# Patient Record
Sex: Female | Born: 1945
Health system: Southern US, Community
[De-identification: ages and names within clinical notes are randomized; demographics above are authoritative.]

## PROBLEM LIST (undated history)

## (undated) DIAGNOSIS — R001 Bradycardia, unspecified: Secondary | ICD-10-CM

## (undated) DIAGNOSIS — R51 Headache: Secondary | ICD-10-CM

## (undated) DIAGNOSIS — M1990 Unspecified osteoarthritis, unspecified site: Secondary | ICD-10-CM

## (undated) DIAGNOSIS — R112 Nausea with vomiting, unspecified: Secondary | ICD-10-CM

## (undated) DIAGNOSIS — Z9889 Other specified postprocedural states: Secondary | ICD-10-CM

## (undated) DIAGNOSIS — D649 Anemia, unspecified: Secondary | ICD-10-CM

## (undated) DIAGNOSIS — Z9289 Personal history of other medical treatment: Secondary | ICD-10-CM

## (undated) DIAGNOSIS — N390 Urinary tract infection, site not specified: Secondary | ICD-10-CM

## (undated) DIAGNOSIS — F32A Depression, unspecified: Secondary | ICD-10-CM

## (undated) DIAGNOSIS — I341 Nonrheumatic mitral (valve) prolapse: Secondary | ICD-10-CM

## (undated) DIAGNOSIS — Z950 Presence of cardiac pacemaker: Secondary | ICD-10-CM

## (undated) DIAGNOSIS — M797 Fibromyalgia: Secondary | ICD-10-CM

## (undated) DIAGNOSIS — K219 Gastro-esophageal reflux disease without esophagitis: Secondary | ICD-10-CM

## (undated) DIAGNOSIS — G8929 Other chronic pain: Secondary | ICD-10-CM

## (undated) DIAGNOSIS — F419 Anxiety disorder, unspecified: Secondary | ICD-10-CM

## (undated) DIAGNOSIS — J189 Pneumonia, unspecified organism: Secondary | ICD-10-CM

## (undated) DIAGNOSIS — R42 Dizziness and giddiness: Secondary | ICD-10-CM

## (undated) DIAGNOSIS — F329 Major depressive disorder, single episode, unspecified: Secondary | ICD-10-CM

## (undated) DIAGNOSIS — E785 Hyperlipidemia, unspecified: Secondary | ICD-10-CM

## (undated) DIAGNOSIS — I4891 Unspecified atrial fibrillation: Secondary | ICD-10-CM

## (undated) DIAGNOSIS — R519 Headache, unspecified: Secondary | ICD-10-CM

## (undated) DIAGNOSIS — K589 Irritable bowel syndrome without diarrhea: Secondary | ICD-10-CM

## (undated) HISTORY — DX: Gastro-esophageal reflux disease without esophagitis: K21.9

## (undated) HISTORY — DX: Pneumonia, unspecified organism: J18.9

## (undated) HISTORY — DX: Headache: R51

## (undated) HISTORY — PX: BLADDER SUSPENSION: SHX72

## (undated) HISTORY — DX: Anemia, unspecified: D64.9

## (undated) HISTORY — DX: Dizziness and giddiness: R42

## (undated) HISTORY — DX: Anxiety disorder, unspecified: F41.9

## (undated) HISTORY — PX: TOTAL ABDOMINAL HYSTERECTOMY: SHX209

## (undated) HISTORY — PX: TYMPANOPLASTY: SHX33

## (undated) HISTORY — PX: DILATION AND CURETTAGE OF UTERUS: SHX78

## (undated) HISTORY — DX: Major depressive disorder, single episode, unspecified: F32.9

## (undated) HISTORY — DX: Fibromyalgia: M79.7

## (undated) HISTORY — DX: Depression, unspecified: F32.A

## (undated) HISTORY — DX: Headache, unspecified: R51.9

## (undated) HISTORY — DX: Unspecified osteoarthritis, unspecified site: M19.90

## (undated) HISTORY — DX: Nonrheumatic mitral (valve) prolapse: I34.1

## (undated) HISTORY — DX: Hyperlipidemia, unspecified: E78.5

## (undated) HISTORY — PX: TONSILLECTOMY AND ADENOIDECTOMY: SUR1326

## (undated) HISTORY — DX: Other chronic pain: G89.29

## (undated) HISTORY — DX: Urinary tract infection, site not specified: N39.0

## (undated) HISTORY — DX: Irritable bowel syndrome, unspecified: K58.9

## (undated) HISTORY — DX: Personal history of other medical treatment: Z92.89

---

## 1999-07-08 ENCOUNTER — Other Ambulatory Visit: Admission: RE | Admit: 1999-07-08 | Discharge: 1999-07-08 | Payer: Self-pay | Admitting: Gynecology

## 2000-01-17 ENCOUNTER — Encounter: Payer: Self-pay | Admitting: Internal Medicine

## 2000-01-17 ENCOUNTER — Encounter: Admission: RE | Admit: 2000-01-17 | Discharge: 2000-01-17 | Payer: Self-pay | Admitting: Internal Medicine

## 2000-03-03 ENCOUNTER — Encounter: Admission: RE | Admit: 2000-03-03 | Discharge: 2000-03-03 | Payer: Self-pay | Admitting: Internal Medicine

## 2000-03-03 ENCOUNTER — Ambulatory Visit (HOSPITAL_COMMUNITY): Admission: RE | Admit: 2000-03-03 | Discharge: 2000-03-03 | Payer: Self-pay | Admitting: Internal Medicine

## 2000-03-03 ENCOUNTER — Encounter: Payer: Self-pay | Admitting: Internal Medicine

## 2000-06-24 ENCOUNTER — Encounter: Admission: RE | Admit: 2000-06-24 | Discharge: 2000-06-24 | Payer: Self-pay | Admitting: Internal Medicine

## 2000-06-24 ENCOUNTER — Encounter: Payer: Self-pay | Admitting: Internal Medicine

## 2000-10-08 ENCOUNTER — Encounter: Payer: Self-pay | Admitting: Neurology

## 2000-10-08 ENCOUNTER — Ambulatory Visit (HOSPITAL_COMMUNITY): Admission: RE | Admit: 2000-10-08 | Discharge: 2000-10-08 | Payer: Self-pay | Admitting: Neurology

## 2000-12-10 ENCOUNTER — Other Ambulatory Visit: Admission: RE | Admit: 2000-12-10 | Discharge: 2000-12-10 | Payer: Self-pay | Admitting: Gynecology

## 2002-02-07 ENCOUNTER — Encounter: Admission: RE | Admit: 2002-02-07 | Discharge: 2002-02-07 | Payer: Self-pay | Admitting: Internal Medicine

## 2002-02-07 ENCOUNTER — Encounter: Payer: Self-pay | Admitting: Internal Medicine

## 2003-01-02 ENCOUNTER — Encounter: Admission: RE | Admit: 2003-01-02 | Discharge: 2003-01-02 | Payer: Self-pay | Admitting: Internal Medicine

## 2003-01-02 ENCOUNTER — Encounter: Payer: Self-pay | Admitting: Internal Medicine

## 2003-02-23 ENCOUNTER — Encounter: Admission: RE | Admit: 2003-02-23 | Discharge: 2003-02-23 | Payer: Self-pay | Admitting: Internal Medicine

## 2003-02-23 ENCOUNTER — Encounter: Payer: Self-pay | Admitting: Internal Medicine

## 2004-02-27 ENCOUNTER — Ambulatory Visit (HOSPITAL_COMMUNITY): Admission: RE | Admit: 2004-02-27 | Discharge: 2004-02-27 | Payer: Self-pay | Admitting: Internal Medicine

## 2004-08-29 ENCOUNTER — Ambulatory Visit: Payer: Self-pay | Admitting: Internal Medicine

## 2004-09-01 ENCOUNTER — Encounter: Admission: RE | Admit: 2004-09-01 | Discharge: 2004-09-01 | Payer: Self-pay | Admitting: Internal Medicine

## 2007-04-08 ENCOUNTER — Other Ambulatory Visit: Admission: RE | Admit: 2007-04-08 | Discharge: 2007-04-08 | Payer: Self-pay | Admitting: Gynecology

## 2008-05-09 ENCOUNTER — Ambulatory Visit: Payer: Self-pay | Admitting: Pulmonary Disease

## 2008-10-11 ENCOUNTER — Encounter: Admission: RE | Admit: 2008-10-11 | Discharge: 2008-10-11 | Payer: Self-pay

## 2008-10-15 ENCOUNTER — Ambulatory Visit: Payer: Self-pay | Admitting: Internal Medicine

## 2008-10-26 ENCOUNTER — Ambulatory Visit: Payer: Self-pay | Admitting: Internal Medicine

## 2008-11-16 ENCOUNTER — Encounter: Admission: RE | Admit: 2008-11-16 | Discharge: 2008-11-16 | Payer: Self-pay | Admitting: Internal Medicine

## 2008-11-16 ENCOUNTER — Ambulatory Visit: Payer: Self-pay | Admitting: Internal Medicine

## 2008-11-26 ENCOUNTER — Telehealth (INDEPENDENT_AMBULATORY_CARE_PROVIDER_SITE_OTHER): Payer: Self-pay

## 2008-11-27 ENCOUNTER — Ambulatory Visit: Payer: Self-pay

## 2008-11-27 ENCOUNTER — Encounter: Payer: Self-pay | Admitting: Cardiology

## 2009-01-17 ENCOUNTER — Ambulatory Visit: Payer: Self-pay | Admitting: Internal Medicine

## 2009-03-27 ENCOUNTER — Ambulatory Visit: Payer: Self-pay | Admitting: Internal Medicine

## 2009-06-24 ENCOUNTER — Ambulatory Visit: Payer: Self-pay | Admitting: Internal Medicine

## 2009-07-08 ENCOUNTER — Ambulatory Visit: Payer: Self-pay | Admitting: Internal Medicine

## 2010-02-24 ENCOUNTER — Ambulatory Visit: Payer: Self-pay | Admitting: Internal Medicine

## 2010-03-07 ENCOUNTER — Ambulatory Visit: Payer: Self-pay | Admitting: Internal Medicine

## 2010-03-11 ENCOUNTER — Ambulatory Visit: Payer: Self-pay | Admitting: Gynecology

## 2010-03-11 ENCOUNTER — Other Ambulatory Visit: Admission: RE | Admit: 2010-03-11 | Discharge: 2010-03-11 | Payer: Self-pay | Admitting: Gynecology

## 2010-03-21 ENCOUNTER — Ambulatory Visit: Payer: Self-pay | Admitting: Internal Medicine

## 2010-04-03 ENCOUNTER — Ambulatory Visit: Payer: Self-pay | Admitting: Internal Medicine

## 2010-04-18 ENCOUNTER — Ambulatory Visit: Payer: Self-pay | Admitting: Internal Medicine

## 2010-04-24 ENCOUNTER — Ambulatory Visit: Payer: Self-pay | Admitting: Gynecology

## 2010-04-28 ENCOUNTER — Ambulatory Visit: Payer: Self-pay | Admitting: Internal Medicine

## 2010-04-30 ENCOUNTER — Encounter: Admission: RE | Admit: 2010-04-30 | Discharge: 2010-04-30 | Payer: Self-pay | Admitting: Internal Medicine

## 2010-07-18 ENCOUNTER — Ambulatory Visit: Payer: Self-pay | Admitting: Internal Medicine

## 2010-08-22 ENCOUNTER — Ambulatory Visit
Admission: RE | Admit: 2010-08-22 | Discharge: 2010-08-22 | Payer: Self-pay | Source: Home / Self Care | Attending: Internal Medicine | Admitting: Internal Medicine

## 2010-09-23 ENCOUNTER — Ambulatory Visit (INDEPENDENT_AMBULATORY_CARE_PROVIDER_SITE_OTHER): Payer: Medicare Other | Admitting: Internal Medicine

## 2010-09-23 DIAGNOSIS — F329 Major depressive disorder, single episode, unspecified: Secondary | ICD-10-CM

## 2010-09-23 DIAGNOSIS — M25519 Pain in unspecified shoulder: Secondary | ICD-10-CM

## 2010-11-24 ENCOUNTER — Ambulatory Visit (INDEPENDENT_AMBULATORY_CARE_PROVIDER_SITE_OTHER): Payer: Medicare Other | Admitting: Internal Medicine

## 2010-11-24 DIAGNOSIS — IMO0001 Reserved for inherently not codable concepts without codable children: Secondary | ICD-10-CM

## 2010-11-24 DIAGNOSIS — R5381 Other malaise: Secondary | ICD-10-CM

## 2010-11-24 DIAGNOSIS — F329 Major depressive disorder, single episode, unspecified: Secondary | ICD-10-CM

## 2011-02-02 ENCOUNTER — Other Ambulatory Visit: Payer: Self-pay | Admitting: *Deleted

## 2011-02-02 MED ORDER — ALPRAZOLAM 0.5 MG PO TABS: 0.5000 mg | ORAL_TABLET | Freq: Every evening | ORAL | Status: DC | PRN

## 2011-02-13 ENCOUNTER — Encounter: Payer: Self-pay | Admitting: *Deleted

## 2011-02-17 ENCOUNTER — Other Ambulatory Visit: Payer: Self-pay | Admitting: *Deleted

## 2011-02-17 MED ORDER — FLUOXETINE HCL 40 MG PO CAPS: 40.0000 mg | ORAL_CAPSULE | Freq: Every day | ORAL | Status: DC

## 2011-03-10 ENCOUNTER — Telehealth: Payer: Self-pay

## 2011-03-10 MED ORDER — HYDROCODONE-ACETAMINOPHEN 10-650 MG PO TABS: 1.0000 | ORAL_TABLET | Freq: Four times a day (QID) | ORAL | Status: AC | PRN

## 2011-03-10 NOTE — Telephone Encounter (Signed)
Faxed to drug store Lorcet 10/650 (#60) no refill one p.o. q 6 hours prn pain

## 2011-03-10 NOTE — Telephone Encounter (Signed)
Patient request for Lorcet 10/650 written per Dr. Lenord Fellers and faxed to her pharmacy

## 2011-03-27 ENCOUNTER — Other Ambulatory Visit: Payer: Self-pay

## 2011-03-27 MED ORDER — BUPROPION HCL ER (XL) 150 MG PO TB24: 150.0000 mg | ORAL_TABLET | ORAL | Status: DC

## 2011-05-07 ENCOUNTER — Encounter: Payer: Self-pay | Admitting: Internal Medicine

## 2011-05-07 ENCOUNTER — Telehealth: Payer: Self-pay | Admitting: Internal Medicine

## 2011-05-07 ENCOUNTER — Telehealth: Payer: Self-pay

## 2011-05-07 MED ORDER — ALPRAZOLAM 1 MG PO TABS: 1.0000 mg | ORAL_TABLET | Freq: Every evening | ORAL | Status: DC | PRN

## 2011-05-07 NOTE — Telephone Encounter (Signed)
New RX for Xanax phoned to [facility].  Patient advised.

## 2011-05-07 NOTE — Telephone Encounter (Signed)
New dose of Alprazolam faxed to [facility]

## 2011-06-12 ENCOUNTER — Encounter: Payer: Self-pay | Admitting: Internal Medicine

## 2011-06-15 ENCOUNTER — Other Ambulatory Visit: Payer: Medicare Other | Admitting: Internal Medicine

## 2011-06-15 DIAGNOSIS — J309 Allergic rhinitis, unspecified: Secondary | ICD-10-CM

## 2011-06-15 DIAGNOSIS — R519 Headache, unspecified: Secondary | ICD-10-CM

## 2011-06-15 DIAGNOSIS — M797 Fibromyalgia: Secondary | ICD-10-CM

## 2011-06-15 LAB — CBC WITH DIFFERENTIAL/PLATELET
Basophils Absolute: 0 10*3/uL (ref 0.0–0.1)
Basophils Relative: 0 % (ref 0–1)
Eosinophils Absolute: 0.1 10*3/uL (ref 0.0–0.7)
Eosinophils Relative: 3 % (ref 0–5)
HCT: 40.4 % (ref 36.0–46.0)
MCH: 27.4 pg (ref 26.0–34.0)
MCHC: 31.9 g/dL (ref 30.0–36.0)
MCV: 85.8 fL (ref 78.0–100.0)
Monocytes Absolute: 0.6 10*3/uL (ref 0.1–1.0)
RDW: 13 % (ref 11.5–15.5)

## 2011-06-15 LAB — COMPREHENSIVE METABOLIC PANEL
AST: 19 U/L (ref 0–37)
Alkaline Phosphatase: 58 U/L (ref 39–117)
BUN: 20 mg/dL (ref 6–23)
Calcium: 8.9 mg/dL (ref 8.4–10.5)
Chloride: 103 mEq/L (ref 96–112)
Creat: 0.59 mg/dL (ref 0.50–1.10)

## 2011-06-15 LAB — LIPID PANEL
HDL: 56 mg/dL (ref >39)
LDL Cholesterol: 184 mg/dL — ABNORMAL HIGH (ref 0–99)
Triglycerides: 102 mg/dL (ref <150)

## 2011-06-16 ENCOUNTER — Encounter: Payer: Self-pay | Admitting: Internal Medicine

## 2011-06-16 ENCOUNTER — Ambulatory Visit
Admission: RE | Admit: 2011-06-16 | Discharge: 2011-06-16 | Disposition: A | Discharge status: Home / Self Care | Payer: Medicare Other | Source: Ambulatory Visit | Attending: Internal Medicine | Admitting: Internal Medicine

## 2011-06-16 ENCOUNTER — Ambulatory Visit (INDEPENDENT_AMBULATORY_CARE_PROVIDER_SITE_OTHER): Payer: Medicare Other | Admitting: Internal Medicine

## 2011-06-16 DIAGNOSIS — F32A Depression, unspecified: Secondary | ICD-10-CM | POA: Insufficient documentation

## 2011-06-16 DIAGNOSIS — IMO0001 Reserved for inherently not codable concepts without codable children: Secondary | ICD-10-CM

## 2011-06-16 DIAGNOSIS — M797 Fibromyalgia: Secondary | ICD-10-CM

## 2011-06-16 DIAGNOSIS — K219 Gastro-esophageal reflux disease without esophagitis: Secondary | ICD-10-CM | POA: Insufficient documentation

## 2011-06-16 DIAGNOSIS — E785 Hyperlipidemia, unspecified: Secondary | ICD-10-CM

## 2011-06-16 DIAGNOSIS — F419 Anxiety disorder, unspecified: Secondary | ICD-10-CM | POA: Insufficient documentation

## 2011-06-16 DIAGNOSIS — J302 Other seasonal allergic rhinitis: Secondary | ICD-10-CM | POA: Insufficient documentation

## 2011-06-16 DIAGNOSIS — Z8679 Personal history of other diseases of the circulatory system: Secondary | ICD-10-CM | POA: Insufficient documentation

## 2011-06-16 DIAGNOSIS — J309 Allergic rhinitis, unspecified: Secondary | ICD-10-CM

## 2011-06-16 DIAGNOSIS — F329 Major depressive disorder, single episode, unspecified: Secondary | ICD-10-CM | POA: Insufficient documentation

## 2011-06-16 DIAGNOSIS — Z8669 Personal history of other diseases of the nervous system and sense organs: Secondary | ICD-10-CM | POA: Insufficient documentation

## 2011-06-16 DIAGNOSIS — Z0000 Encounter for general adult medical examination without abnormal findings: Secondary | ICD-10-CM

## 2011-06-16 DIAGNOSIS — R05 Cough: Secondary | ICD-10-CM

## 2011-06-16 DIAGNOSIS — F411 Generalized anxiety disorder: Secondary | ICD-10-CM

## 2011-06-16 LAB — POCT URINALYSIS DIPSTICK
Blood, UA: NEGATIVE
Glucose, UA: NEGATIVE
Spec Grav, UA: 1.02
Urobilinogen, UA: 0.2

## 2011-06-16 NOTE — Progress Notes (Signed)
Subjective:    Patient ID: Wendy Rose, female    DOB: September 18, 1945, 65 y.o.   MRN: [id number]  HPI 65 year old white female with complicated history. History of fibromyalgia syndrome for many years currently seen by Dr. Corliss Skains. History of anxiety and depression. History of asthma. History of GE reflux and migraine headache. Rheumatologist sent patient to see psychiatrist, I believe it might be Dr. Evelene Croon but patient has not been there in a while. Her mother died in 04/09/2023 and she has a significant grief reaction. She has tried Cymbalta in the past but says that it had adverse reactions in that it made her "feet everybody". Has tried Lyrica in the past. She and her husband are in today and I had a lengthy talk with both of them. He is concerned about her. Says she has no energy , has a chronic cough, is lethargic, has fallen a couple of times. She is taking a lot of meds at night. This includes all [place], Robaxin, Xanax. She was taking Protonix for GE reflux. Says is not working and she's had more heartburn recently which is concerning to her. She's also on Prozac and Wellbutrin as well as metoprolol for history of palpitations and mitral valve prolapse. Takes Lorcet when necessary whenever fibromyalgia algia pain is severe. Patient blames herself for her Mother's Day S. Mother was in a nursing home and apparently had a stroke. Patient feels that she should of been more aggressive in getting her to the hospital sooner. Patient does not smoke or consume alcohol. Has one son in good health.  Family history: mother died of complications of a stroke, father died at age 60 of an MI. One brother died of an MI with history of pacemaker and congenital heart defect. One sister with history of mitral valve prolapse, hypertension congestive heart failure and pacemaker.  Husband recently had a scare with his heart is well. He has COPD. Says that she's been worried about him in addition to grieving over her mother.  Husband is retired from [employer]  Patient was diagnosed with mitral valve prolapse in the  1990s by Dr. Elsie Lincoln. 2-D echocardiogram 06/03/1989 showed midsystolic posterior leaflet prolapse and subtle evidence of anterior mitral leaflet prolapse.  For while she took amitriptyline for fibromyalgia. For many years took Darvocet for fibromyalgia pain. Formerly seen by Dr. Phylliss Bob, rheumatologist. Has had basic rheumatology workup consisting of a negative ANA, normal CPK, normal sed rate.  History of herpes zoster ophthalmicus September 2011 seen by Dr. Elmer Picker. Has seen Dr. clinic in for allergic rhinitis and eustachian tube dysfunction with chronic cough. Normal pulmonary functions done in 2000. Allergy testing done positive for grass, tree, and mold. Had allergy vaccine which ended in 1993.  [facility] last 4 evaluation of musculoskeletal weakness. Did have an EMG that showed mild myopathic changes. Muscle biopsy in the past has been nonspecific. It was felt that the myopathic changes were mild and perhaps related to a radiculopathy. However this recent history of falling is more concerning I think she needs to go back to do. Neurologist she saw there was Dr. Georgina Pillion she's also going to have a chest x-ray to evaluate cough which could be related to GE reflux and/or allergy/asthma. Has been seen in the past by Dr. Meryl Crutch & diagnosed with migraine headaches. Issues with fibromyalgia migraine headaches and asthma dates back to the 1990s. Was seen by psychiatrist at that time, Dr. Claudette Head who has since left town  Also history of recurrent urinary tract infections. Has been diagnosed with hypotonic bladder and urethra rhinitis by urologist. Was maintained on trimethoprim for while.  Intolerant to Macrobid sulfa Ceftin and codeine. Says Macrobid caused elevated liver functions and Ceftin caused a rash. Is able to take hydrocodone. Naprosyn causes GI upset. Unable to tolerate Clinoril or  sulfa drugs.    Review of Systems  Constitutional: Positive for fatigue.  Eyes: Negative.   Cardiovascular: Negative.   Gastrointestinal:       Heartburn not controlled with Protonix  Genitourinary:       History of recurrent urinary tract infections, urethra rhinitis and hypotonic bladder  Musculoskeletal: Positive for myalgias.  Skin: Negative.   Neurological: Positive for weakness and headaches.  Hematological: Negative.   Psychiatric/Behavioral: Positive for dysphoric mood and decreased concentration. The patient is nervous/anxious.        Objective:   Physical Exam  Vitals reviewed. Constitutional: She is oriented to person, place, and time. She appears well-developed. She appears distressed.       Patient seems extremely worried about her health. Says that her he had does not feel "right ".  HENT:  Head: Normocephalic and atraumatic.  Right Ear: External ear normal.  Left Ear: External ear normal.  Mouth/Throat: Oropharynx is clear and moist.  Eyes: EOM are normal. Pupils are equal, round, and reactive to light. No scleral icterus.  Neck: Neck supple. No JVD present. No thyromegaly present.  Cardiovascular: Normal rate, regular rhythm, normal heart sounds and intact distal pulses.   No murmur heard.      No click appreciated  Pulmonary/Chest: Effort normal and breath sounds normal. She has no wheezes. She has no rales.       Breasts normal female  Abdominal: Soft. Bowel sounds are normal. She exhibits no distension and no mass. There is no tenderness. There is no rebound and no guarding.  Genitourinary:       Deferred  Musculoskeletal: Normal range of motion. She exhibits tenderness. She exhibits no edema.       Multiple trigger points in back  Lymphadenopathy:    She has no cervical adenopathy.  Neurological: She is alert and oriented to person, place, and time. She has normal reflexes. No cranial nerve deficit. She exhibits normal muscle tone. Coordination normal.    Skin: Skin is warm and dry.  Psychiatric: Judgment normal.       Dysphoric mood          Assessment & Plan:  Depression  Grief reaction Fibromyalgia syndrome  Nonspecific mild myelopathy diagnosed on EMG at the  History of migraine headache  Remote history of mitral valve prolapse  History of allergic rhinitis  Plan: Patient needs to have mammogram. Suggest patient return to see Dr. Evelene Croon regarding depression and grief reaction. Suggest patient return to do neurology department for further evaluation particularly with complaints of recent falls. Have asked patient to discuss with rheumatologist her medication regimen and consider cutting back on it a bit. May be feeling drowsy in the mornings for medication she takes at bedtime. Refer to GI physician for evaluation of heartburn. She and her husband seem to be satisfied with this plan. She will also have a chest x-ray because of complaint of cough.

## 2011-06-16 NOTE — Patient Instructions (Signed)
Please have a mammogram. You're being sent for chest x-ray to evaluate cough. Your cholesterol is elevated. He has not tolerated statin medications in the past so we're just simply going to go with diet and exercise. Please return to see psychiatrist for reevaluation. Please return to do to neurology department for reevaluation. We will refer you to gastroenterologist for evaluation of heartburn.

## 2011-06-17 NOTE — Progress Notes (Signed)
Addended by: Judy Pimple on: 06/17/2011 11:38 AM   Modules accepted: Orders

## 2011-06-23 ENCOUNTER — Encounter: Payer: Self-pay | Admitting: Gastroenterology

## 2011-06-25 ENCOUNTER — Other Ambulatory Visit: Payer: Self-pay

## 2011-06-25 MED ORDER — PANTOPRAZOLE SODIUM 40 MG PO TBEC: 40.0000 mg | DELAYED_RELEASE_TABLET | Freq: Every day | ORAL | Status: DC

## 2011-07-14 ENCOUNTER — Ambulatory Visit: Payer: Medicare Other | Admitting: Gastroenterology

## 2011-07-20 ENCOUNTER — Encounter: Payer: Self-pay | Admitting: Internal Medicine

## 2011-07-20 ENCOUNTER — Ambulatory Visit (INDEPENDENT_AMBULATORY_CARE_PROVIDER_SITE_OTHER): Payer: Medicare Other | Admitting: Internal Medicine

## 2011-07-20 VITALS — BP 110/60 | HR 74 | Ht 71.0 in | Wt 159.0 lb

## 2011-07-20 DIAGNOSIS — K219 Gastro-esophageal reflux disease without esophagitis: Secondary | ICD-10-CM

## 2011-07-20 DIAGNOSIS — Z1211 Encounter for screening for malignant neoplasm of colon: Secondary | ICD-10-CM

## 2011-07-20 DIAGNOSIS — K589 Irritable bowel syndrome without diarrhea: Secondary | ICD-10-CM

## 2011-07-20 NOTE — Progress Notes (Signed)
HISTORY OF PRESENT ILLNESS:  Wendy Rose is a 65 y.o. female with multiple medical problems as listed below. She carries the diagnosis of irritable bowel syndrome and GERD, though she has not had a formal GI evaluation. She does report long-standing chronic GERD symptoms that require PPI therapy for relief. She generally takes pantoprazole 40 mg daily. Despite this occasional indigestion and pyrosis with dietary indiscretion. She will take occasional antacids for breakthrough symptoms. She is anticipating a course of prednisone, which he states exacerbates her GERD. She said today regarding for more formal valuation of chronic GERD symptoms requiring PPI for incomplete relief. She has been advised previously with regard to screening colonoscopy, but has declined. She states that her durable bowel syndrome as manifested by alternating constipation and diarrhea as well as bloating. Triggers seem to be meals and anxiety or stress. She denies dysphagia. Her husband Wendy Maduro, is a patient of mine. Review of outside laboratories from 06/15/2011 revealed normal CBC and comprehensive metabolic panel.  REVIEW OF SYSTEMS:  All non-GI ROS negative except for sinus and allergy trouble, anxiety, arthritis, back pain, visual change due to cataracts, confusion, cough, depression, fatigue, headaches, heart murmur, heart rhythm change, itching, muscle pains, muscle cramps, sleeping problems, excessive thirst.  Past Medical History  Diagnosis Date  . Fibromyalgia   . MVP (mitral valve prolapse)   . Vertigo   . Anemia   . Anxiety   . heart arrhythmia   . Arthritis   . Chronic headaches   . Depression   . GERD (gastroesophageal reflux disease)   . Hyperlipemia   . IBS (irritable bowel syndrome)   . Pneumonia   . UTI (lower urinary tract infection)     Past Surgical History  Procedure Date  . Tonsillectomy and adenoidectomy   . Tympanoplasty   . Dilation and curettage of uterus   . Total abdominal  hysterectomy   . Bladder suspension   . Cesarean section     Social History Wendy Rose  reports that she has never smoked. She has never used smokeless tobacco. She reports that she does not drink alcohol or use illicit drugs.  family history includes Diabetes in her sister; Heart disease in her brother and father; and Hypertension in her mother.  Allergies  Allergen Reactions  . Cefuroxime Axetil     REACTION: Rash  . Codeine     REACTION: Reaction not known  . Naproxen     REACTION: Reaction not known  . Nitrofurantoin     REACTION: Increase LFT's  . Sulfonamide Derivatives     REACTION: Reaction not known  . Sulindac     REACTION: Reaction not known       PHYSICAL EXAMINATION: Vital signs: BP 110/60  Pulse 74  Ht 5\' 11"  (1.803 m)  Wt 159 lb (72.122 kg)  BMI 22.18 kg/m2  Constitutional: generally well-appearing, no acute distress Psychiatric: alert and oriented x3, cooperative Eyes: extraocular movements intact, anicteric, conjunctiva pink Mouth: oral pharynx moist, no lesions Neck: supple no lymphadenopathy Cardiovascular: heart regular rate and rhythm, no murmur Lungs: clear to auscultation bilaterally Abdomen: soft, nontender, nondistended, no obvious ascites, no peritoneal signs, normal bowel sounds, no organomegaly Rectal: Ommitted Extremities: no lower extremity edema bilaterally Skin: no lesions on visible extremities Neuro: No focal deficits.   ASSESSMENT:  #1. Chronic GERD. Breakthrough symptoms despite daily PPI #2. Irritable bowel syndrome #3. Colon cancer screening. Appropriate candidate without contraindication. Patient declines. I did discuss with her the nature of the  procedure as well as the risks, benefits, and alternatives. I also provide educational literature on colon polyps and colon cancer as well as colonoscopy. If she changes her mind, she's invited to contact the office to schedule the examination. She clearly understands the  importance.   PLAN:   #1. Reflux percussions #2. Continue PPI #3. Diagnostic upper endoscopy. The nature of the procedure, as well as the risks, benefits, and alternatives were carefully and thoroughly reviewed with the patient. Ample time for discussion and questions allowed. The patient understood, was satisfied, and agreed to proceed.

## 2011-07-20 NOTE — Patient Instructions (Signed)
You have been scheduled for an endoscopy. Please follow written instructions given to you at your visit today.  

## 2011-07-22 ENCOUNTER — Telehealth: Payer: Self-pay | Admitting: Internal Medicine

## 2011-07-22 NOTE — Telephone Encounter (Signed)
Appointment was scheduled with Dr. Kelli Hope at [facility] Neurology Department for August 26, 2011 at 3:00 p.m. Records have been faxed to [phone] and confirmation of fax receipt has been received.

## 2011-07-29 ENCOUNTER — Ambulatory Visit (AMBULATORY_SURGERY_CENTER): Payer: Medicare Other | Admitting: Internal Medicine

## 2011-07-29 ENCOUNTER — Encounter: Payer: Self-pay | Admitting: Internal Medicine

## 2011-07-29 VITALS — BP 142/80 | HR 56 | Temp 96.7°F | Resp 15 | Ht 71.0 in | Wt 159.0 lb

## 2011-07-29 DIAGNOSIS — K219 Gastro-esophageal reflux disease without esophagitis: Secondary | ICD-10-CM

## 2011-07-29 DIAGNOSIS — D131 Benign neoplasm of stomach: Secondary | ICD-10-CM

## 2011-07-29 MED ORDER — SODIUM CHLORIDE 0.9 % IV SOLN: 500.0000 mL | INTRAVENOUS | Status: DC

## 2011-07-29 NOTE — Patient Instructions (Signed)
Discharge instructions given with verbal understanding. Handouts on hiatal hernia and a soft diet given. Resume previous medications. 

## 2011-07-29 NOTE — Op Note (Signed)
[facility] [address] [place], [place]  [address]  ENDOSCOPY PROCEDURE REPORT  PATIENT:  Wendy, Rose  MR#:  [id number] BIRTHDATE:  Apr 17, 1946, 65 yrs. old  GENDER:  female  ENDOSCOPIST:  Wilhemina Bonito. Eda Keys, MD Referred by:  Office  PROCEDURE DATE:  07/29/2011 PROCEDURE:  EGD, diagnostic 19147 ASA CLASS:  Class II INDICATIONS:  GERD ; refractory to daily PPI  MEDICATIONS:   Fentanyl 75 mcg IV, Versed 8 mg IV, These medications were titrated to patient response per physician's verbal order TOPICAL ANESTHETIC:  Cetacaine Spray  DESCRIPTION OF PROCEDURE:   After the risks benefits and alternatives of the procedure were thoroughly explained, informed consent was obtained.  The LB GIF-H180 [id number] endoscope was introduced through the mouth and advanced to the second portion of the duodenum, without limitations.  The instrument was slowly withdrawn as the mucosa was fully examined. <<PROCEDUREIMAGES>>  The esophagus and gastroesophageal junction were completely normal in appearance.No Barrett's.  There were multiple small benign fundic gland type polyps identified. in the body/fundus of the stomach.  Otherwise the examination of the stomach and duodenum was normal.    Retroflexed views revealed a small hiatal hernia. The scope was then withdrawn from the patient and the procedure completed.  COMPLICATIONS:  None  ENDOSCOPIC IMPRESSION: 1) Normal esophagus 2) Polyps, multiple small benign in the the stomach 3) Otherwise normal examination 4) A small hiatal hernia 5) GERD  RECOMMENDATIONS: 1) Anti-reflux regimen to be followed (see information sheet provided) 2) FOR FREQUENT REFLUX SYMPTOMS, DESPITE ONCE DAILY PROTONIX, INCREASE PROTONIX TO TWICE DAILY (best taken 30 inutes prior to breakfast and evening meal) 3) OP follow-up  PRN  ______________________________ Wilhemina Bonito. Eda Keys, MD  CC:  Wendy Salina, MD;   The Patient  n. eSIGNED:   Wilhemina Bonito. Eda Keys  at 07/29/2011 08:38 AM  Bolser, Windell Moulding, [phone]

## 2011-07-29 NOTE — Progress Notes (Signed)
Patient did not experience any of the following events: a burn prior to discharge; a fall within the facility; wrong site/side/patient/procedure/implant event; or a hospital transfer or hospital admission upon discharge from the facility. (G8907) Patient did not have preoperative order for IV antibiotic SSI prophylaxis. (G8918)  

## 2011-07-30 ENCOUNTER — Telehealth: Payer: Self-pay

## 2011-07-30 NOTE — Telephone Encounter (Signed)

## 2011-08-20 ENCOUNTER — Other Ambulatory Visit: Payer: Self-pay

## 2011-08-20 MED ORDER — ALPRAZOLAM 1 MG PO TABS: 0.5000 mg | ORAL_TABLET | Freq: Every evening | ORAL | Status: DC | PRN

## 2011-09-29 ENCOUNTER — Telehealth: Payer: Self-pay | Admitting: Internal Medicine

## 2011-09-29 ENCOUNTER — Other Ambulatory Visit: Payer: Self-pay | Admitting: Orthopedic Surgery

## 2011-09-29 DIAGNOSIS — M25511 Pain in right shoulder: Secondary | ICD-10-CM

## 2011-09-29 MED ORDER — HYDROCODONE-ACETAMINOPHEN 10-650 MG PO TABS: 1.0000 | ORAL_TABLET | Freq: Four times a day (QID) | ORAL | Status: DC | PRN

## 2011-09-29 NOTE — Telephone Encounter (Signed)
Refill Lorcet 10/650 #60 with one refill for fibromyalgia. Consider seeing psychiatrist for sleep issues.

## 2011-10-05 ENCOUNTER — Ambulatory Visit
Admission: RE | Admit: 2011-10-05 | Discharge: 2011-10-05 | Disposition: A | Discharge status: Home / Self Care | Payer: Medicare Other | Source: Ambulatory Visit | Attending: Orthopedic Surgery | Admitting: Orthopedic Surgery

## 2011-10-05 DIAGNOSIS — M25511 Pain in right shoulder: Secondary | ICD-10-CM

## 2011-10-26 ENCOUNTER — Other Ambulatory Visit: Payer: Self-pay

## 2011-10-26 MED ORDER — METOPROLOL TARTRATE 50 MG PO TABS: 50.0000 mg | ORAL_TABLET | Freq: Every day | ORAL | Status: DC

## 2011-11-12 ENCOUNTER — Encounter: Payer: Self-pay | Admitting: Internal Medicine

## 2011-12-11 ENCOUNTER — Telehealth: Payer: Self-pay | Admitting: Internal Medicine

## 2011-12-14 ENCOUNTER — Telehealth: Payer: Self-pay

## 2011-12-15 NOTE — Telephone Encounter (Signed)
Opened in error

## 2011-12-17 ENCOUNTER — Telehealth: Payer: Self-pay | Admitting: Internal Medicine

## 2011-12-17 ENCOUNTER — Other Ambulatory Visit: Payer: Self-pay

## 2011-12-17 MED ORDER — ALPRAZOLAM 1 MG PO TABS: 1.0000 mg | ORAL_TABLET | Freq: Every evening | ORAL | Status: DC | PRN

## 2011-12-17 NOTE — Telephone Encounter (Signed)
W/HMO, [facility] does NOT participate with [employer]....therefore she is OUT of network and that's why they didn't pay.  Adam did state that we could TRY to do a referral and work w/[facility] to have them refile claims and SEE IF [employer] will reprocess the claims and make any payment, but he couldn't guarantee anything because of [facility] being out of network.  Spoke with Ms. Parzych and advised of this info.  Asked pt to bring ALL documents by from [facility] so that we can make copies and do a referral and contact [facility] on her behalf.  Pt verbalizes understanding and has been advised that [facility] is NOT in network with her insurance.

## 2011-12-18 ENCOUNTER — Ambulatory Visit: Payer: Medicare Other | Admitting: Internal Medicine

## 2011-12-28 ENCOUNTER — Ambulatory Visit (INDEPENDENT_AMBULATORY_CARE_PROVIDER_SITE_OTHER): Payer: Medicare Other | Admitting: Internal Medicine

## 2011-12-28 ENCOUNTER — Encounter: Payer: Self-pay | Admitting: Internal Medicine

## 2011-12-28 VITALS — BP 144/76 | HR 56 | Temp 98.0°F | Wt 165.0 lb

## 2011-12-28 DIAGNOSIS — E785 Hyperlipidemia, unspecified: Secondary | ICD-10-CM

## 2011-12-28 DIAGNOSIS — R5383 Other fatigue: Secondary | ICD-10-CM

## 2011-12-28 DIAGNOSIS — R5381 Other malaise: Secondary | ICD-10-CM

## 2011-12-28 DIAGNOSIS — IMO0001 Reserved for inherently not codable concepts without codable children: Secondary | ICD-10-CM

## 2011-12-28 DIAGNOSIS — J45909 Unspecified asthma, uncomplicated: Secondary | ICD-10-CM

## 2011-12-28 DIAGNOSIS — Z8669 Personal history of other diseases of the nervous system and sense organs: Secondary | ICD-10-CM

## 2011-12-28 DIAGNOSIS — M797 Fibromyalgia: Secondary | ICD-10-CM

## 2011-12-28 DIAGNOSIS — Z8679 Personal history of other diseases of the circulatory system: Secondary | ICD-10-CM

## 2011-12-28 DIAGNOSIS — G729 Myopathy, unspecified: Secondary | ICD-10-CM

## 2011-12-28 DIAGNOSIS — F329 Major depressive disorder, single episode, unspecified: Secondary | ICD-10-CM

## 2011-12-28 DIAGNOSIS — K219 Gastro-esophageal reflux disease without esophagitis: Secondary | ICD-10-CM

## 2011-12-28 NOTE — Progress Notes (Signed)
  Subjective:    Patient ID: Wendy Rose, female    DOB: [birth year], 66 y.o.   MRN: [id number]  HPI 66 year old white female in today for followup of multiple medical problems including fibromyalgia, headaches, , hyperlipidemia, anxiety depression, insomnia, GE reflux, reported history of mitral valve prolapse, history of asthma. [facility] weakness. I do not have the most recent reports. However she is having some difficulty getting her insurance to pay for that evaluation which I recommended. I have written a letter for her today to send to the insurance to me. Insurance He insisted to her that [facility] was out of network. She has a history of an abnormal EMG. She has quadriceps muscle weakness. Trouble getting out of a chair because of quadriceps weakness. She says that they think she has some form of muscular dystrophy. Apparently some tests were done recently and the results are pending. EMG performed in 2002 was suggested of myopathy. Aldolase was normal in 2006. History of negative ANA in the past. History of normal CPK and sedimentation rate in the past. Patient has been under a lot of stress trying to take care of an elderly mother. She passed away recently. Patient given prescription to have Zostavax vaccine done in the near future. She had pneumococcal vaccine 12/11/1996, tetanus immunization 02/25/2004. Dr. Lily Peer is GYN physician. Gets annual mammograms.    Review of Systems     Objective:   Physical Exam chest clear to auscultation; cardiac exam regular rate and rhythm; extremities without edema. Muscle strength in the lower extremities is 4/5 in all groups tested. Strength in the upper extremities is slightly better.        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 Otherwise see her again in 6 months for brief physical examination without GYN exam.

## 2012-01-09 NOTE — Patient Instructions (Signed)
Please have records from [facility] sent to [place]. Otherwise return in 6 months for physical exam. Continue current medications.

## 2012-01-11 ENCOUNTER — Telehealth: Payer: Self-pay

## 2012-01-11 NOTE — Telephone Encounter (Signed)
Patient will come by one day this week for a pneumovax injection, which is overdue. Also is going  To [facility] on Wednesday and will request records, or pick them up for Dr. Lenord Fellers

## 2012-01-15 ENCOUNTER — Ambulatory Visit (INDEPENDENT_AMBULATORY_CARE_PROVIDER_SITE_OTHER): Payer: Medicare Other | Admitting: Internal Medicine

## 2012-01-15 DIAGNOSIS — Z23 Encounter for immunization: Secondary | ICD-10-CM

## 2012-01-15 MED ORDER — PNEUMOCOCCAL VAC POLYVALENT 25 MCG/0.5ML IJ INJ: 0.5000 mL | INJECTION | Freq: Once | INTRAMUSCULAR | Status: DC

## 2012-02-29 ENCOUNTER — Other Ambulatory Visit: Payer: Self-pay

## 2012-02-29 MED ORDER — FLUOXETINE HCL 40 MG PO CAPS: 40.0000 mg | ORAL_CAPSULE | Freq: Every day | ORAL | Status: DC

## 2012-03-14 ENCOUNTER — Telehealth: Payer: Self-pay | Admitting: Internal Medicine

## 2012-03-14 NOTE — Telephone Encounter (Signed)
Call her eye doctor for an appointment.

## 2012-03-14 NOTE — Telephone Encounter (Signed)
Over the counter that she can use for relief.  She said her eyes are not pink, they are not oozing anything.  She just can't get them to stop itching/burning.  Please advise and thanks.

## 2012-03-14 NOTE — Telephone Encounter (Signed)
Sp w/pt; adv to call her eye doc r/t itching/burning of the eyes, etc.  Pt verbalizes understanding.

## 2012-04-02 ENCOUNTER — Other Ambulatory Visit: Payer: Self-pay | Admitting: Internal Medicine

## 2012-04-19 ENCOUNTER — Other Ambulatory Visit: Payer: Self-pay

## 2012-04-19 MED ORDER — HYDROCODONE-ACETAMINOPHEN 10-650 MG PO TABS: 1.0000 | ORAL_TABLET | Freq: Four times a day (QID) | ORAL | Status: DC | PRN

## 2012-06-10 ENCOUNTER — Ambulatory Visit (INDEPENDENT_AMBULATORY_CARE_PROVIDER_SITE_OTHER): Payer: Medicare Other | Admitting: Internal Medicine

## 2012-06-10 ENCOUNTER — Encounter: Payer: Self-pay | Admitting: Internal Medicine

## 2012-06-10 ENCOUNTER — Telehealth: Payer: Self-pay | Admitting: Internal Medicine

## 2012-06-10 VITALS — BP 126/66 | HR 76 | Temp 97.8°F | Wt 164.0 lb

## 2012-06-10 DIAGNOSIS — G7289 Other specified myopathies: Secondary | ICD-10-CM

## 2012-06-10 DIAGNOSIS — G713 Mitochondrial myopathy, not elsewhere classified: Secondary | ICD-10-CM | POA: Insufficient documentation

## 2012-06-10 DIAGNOSIS — M797 Fibromyalgia: Secondary | ICD-10-CM

## 2012-06-10 DIAGNOSIS — F329 Major depressive disorder, single episode, unspecified: Secondary | ICD-10-CM

## 2012-06-10 DIAGNOSIS — M6281 Muscle weakness (generalized): Secondary | ICD-10-CM

## 2012-06-10 DIAGNOSIS — IMO0001 Reserved for inherently not codable concepts without codable children: Secondary | ICD-10-CM

## 2012-06-10 NOTE — Telephone Encounter (Signed)
Needs to call Rheumatologist or be seen here today.

## 2012-06-10 NOTE — Telephone Encounter (Signed)
Patient called Rheumatologist office and they're closed today.  Gave her appt to see [place] today @ 3:45.  Pt verbalizes understanding.

## 2012-06-12 NOTE — Patient Instructions (Signed)
Take hydrocodone twice daily for pain for a few days and then return to taking tramadol as needed. Return in December for physical exam. Schedule for fasting lab work in the near future here.

## 2012-06-12 NOTE — Progress Notes (Signed)
  Subjective:    Patient ID: Wendy Rose, female    DOB: [birth year], 66 y.o.   MRN: [id number]  HPI 66 year old white female with history of LC anxiety depression, obsessive-compulsive personality disorder, palpitations and mitral valve prolapse, GE reflux in today because of diffuse profound musculoskeletal pain. Generalized and spent a month in the beach and just got back home. There is a weather front coming and that she's been taking a great deal. Says that rheumatologist told her she was reluctant to give her narcotic pain medication for pain. Suggested instead she take tramadol which has not been helping that much. Patient has a history of proximal muscle weakness diagnosed in 2002 initially by a neurologist locally after patient complained of weakness in her lower extremities. EMG of the right leg at that time showed denervation in some L5 and S1 muscles. There were significant involvement of the paraspinal muscles on the right. Mild involvement as well in the cervical paraspinal muscles on the right. No denervation in the right arm noted.  Patient subsequently went to [facility] for evaluation. 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 She is scheduled to go back in December. Patient ambulates slowly. She has to use her arms to push up on arm rests of the chair to stand.  At last visit, she was having considerable anxiety over having to visit her mother in a nursing home. Her mother passed away in 04/20/2023. Patient is here with her husband is quite concerned about her. Dr. Corliss Skains is her rheumatologist. She does have some hydrocodone on hand to take for pain but has been reluctant to do so. I think she probably should take it twice a day for a few days until the pain is under control. This seems to be a pattern with her in the past she would have exacerbations from time to time of musculoskeletal pain. She says that she is waiting to hear from results from sample sent to  [facility] regarding her mitochondrial myopathy.    Review of Systems     Objective:   Physical Exam Skin is warm and dry; nodes none, HEENT exam: TMs and pharynx are clear. Neck is supple without adenopathy. No thyromegaly. Chest clear to auscultation. Cardiac exam regular rate and rhythm normal S1 and S2. Muscle strength testing is 4-5 over 5 in all groups tested yet she clearly has proximal muscle weakness in her legs when attempting to stand       Assessment & Plan:  History of fibromyalgia syndrome-recent exacerbation  History of palpitations-treated with Lopressor  History of GE reflux  Anxiety depression  History of obsessive compulsive disorder  Proximal muscle weakness-possibly related to mitochondrial myopathy being [facility] the [facility]  Plan: Patient scheduled for physical examination here in mid-December perhaps we will have heard more information from [facility]. She'll return next week for fasting lab work. She may take hydrocodone 5/500 twice daily for musculoskeletal pain until this exacerbation resolves. Do not take tramadol with taking hydrocodone.

## 2012-06-16 ENCOUNTER — Other Ambulatory Visit: Payer: Medicare Other | Admitting: Internal Medicine

## 2012-06-16 DIAGNOSIS — I341 Nonrheumatic mitral (valve) prolapse: Secondary | ICD-10-CM

## 2012-06-16 DIAGNOSIS — F329 Major depressive disorder, single episode, unspecified: Secondary | ICD-10-CM

## 2012-06-16 DIAGNOSIS — E785 Hyperlipidemia, unspecified: Secondary | ICD-10-CM

## 2012-06-16 DIAGNOSIS — M797 Fibromyalgia: Secondary | ICD-10-CM

## 2012-06-16 LAB — CBC WITH DIFFERENTIAL/PLATELET
HCT: 37.5 % (ref 36.0–46.0)
Hemoglobin: 12.7 g/dL (ref 12.0–15.0)
Lymphs Abs: 3.1 10*3/uL (ref 0.7–4.0)
Monocytes Absolute: 0.8 10*3/uL (ref 0.1–1.0)
Monocytes Relative: 11 % (ref 3–12)
Neutro Abs: 2.6 10*3/uL (ref 1.7–7.7)
Neutrophils Relative %: 40 % — ABNORMAL LOW (ref 43–77)
RBC: 4.48 MIL/uL (ref 3.87–5.11)

## 2012-06-18 ENCOUNTER — Other Ambulatory Visit: Payer: Self-pay | Admitting: Internal Medicine

## 2012-06-20 ENCOUNTER — Other Ambulatory Visit: Payer: Self-pay

## 2012-06-20 MED ORDER — ALPRAZOLAM 1 MG PO TABS: 1.0000 mg | ORAL_TABLET | Freq: Every evening | ORAL | Status: DC | PRN

## 2012-06-20 NOTE — Telephone Encounter (Signed)
Please check and refill

## 2012-07-11 ENCOUNTER — Other Ambulatory Visit: Payer: Medicare Other | Admitting: Internal Medicine

## 2012-07-12 ENCOUNTER — Encounter: Payer: Medicare Other | Admitting: Internal Medicine

## 2012-07-13 ENCOUNTER — Other Ambulatory Visit: Payer: Medicare Other | Admitting: Internal Medicine

## 2012-07-13 DIAGNOSIS — E785 Hyperlipidemia, unspecified: Secondary | ICD-10-CM

## 2012-07-13 DIAGNOSIS — F419 Anxiety disorder, unspecified: Secondary | ICD-10-CM

## 2012-07-13 DIAGNOSIS — M8580 Other specified disorders of bone density and structure, unspecified site: Secondary | ICD-10-CM

## 2012-07-13 DIAGNOSIS — Z79899 Other long term (current) drug therapy: Secondary | ICD-10-CM

## 2012-07-13 LAB — COMPREHENSIVE METABOLIC PANEL
ALT: 15 U/L (ref 0–35)
Alkaline Phosphatase: 60 U/L (ref 39–117)
Creat: 0.67 mg/dL (ref 0.50–1.10)
Glucose, Bld: 84 mg/dL (ref 70–99)
Sodium: 140 mEq/L (ref 135–145)
Total Bilirubin: 0.5 mg/dL (ref 0.3–1.2)
Total Protein: 6.3 g/dL (ref 6.0–8.3)

## 2012-07-13 LAB — LIPID PANEL
LDL Cholesterol: 194 mg/dL — ABNORMAL HIGH (ref 0–99)
Total CHOL/HDL Ratio: 4.5 Ratio
Triglycerides: 100 mg/dL (ref <150)
VLDL: 20 mg/dL (ref 0–40)

## 2012-07-14 DIAGNOSIS — R519 Headache, unspecified: Secondary | ICD-10-CM | POA: Insufficient documentation

## 2012-08-01 ENCOUNTER — Other Ambulatory Visit: Payer: Self-pay | Admitting: Internal Medicine

## 2012-08-29 ENCOUNTER — Encounter: Payer: Self-pay | Admitting: Internal Medicine

## 2012-08-29 ENCOUNTER — Ambulatory Visit (INDEPENDENT_AMBULATORY_CARE_PROVIDER_SITE_OTHER): Payer: Medicare Other | Admitting: Internal Medicine

## 2012-08-29 VITALS — BP 122/74 | HR 84 | Temp 98.1°F | Ht 70.0 in | Wt 159.5 lb

## 2012-08-29 DIAGNOSIS — F341 Dysthymic disorder: Secondary | ICD-10-CM

## 2012-08-29 DIAGNOSIS — I341 Nonrheumatic mitral (valve) prolapse: Secondary | ICD-10-CM

## 2012-08-29 DIAGNOSIS — F419 Anxiety disorder, unspecified: Secondary | ICD-10-CM

## 2012-08-29 DIAGNOSIS — F329 Major depressive disorder, single episode, unspecified: Secondary | ICD-10-CM

## 2012-08-29 DIAGNOSIS — G7289 Other specified myopathies: Secondary | ICD-10-CM

## 2012-08-29 DIAGNOSIS — E785 Hyperlipidemia, unspecified: Secondary | ICD-10-CM

## 2012-08-29 DIAGNOSIS — M797 Fibromyalgia: Secondary | ICD-10-CM

## 2012-08-29 DIAGNOSIS — K219 Gastro-esophageal reflux disease without esophagitis: Secondary | ICD-10-CM

## 2012-08-29 DIAGNOSIS — Z0000 Encounter for general adult medical examination without abnormal findings: Secondary | ICD-10-CM

## 2012-08-29 DIAGNOSIS — G713 Mitochondrial myopathy, not elsewhere classified: Secondary | ICD-10-CM

## 2012-08-29 DIAGNOSIS — I059 Rheumatic mitral valve disease, unspecified: Secondary | ICD-10-CM

## 2012-08-29 DIAGNOSIS — IMO0001 Reserved for inherently not codable concepts without codable children: Secondary | ICD-10-CM

## 2012-08-29 LAB — POCT URINALYSIS DIPSTICK
Blood, UA: NEGATIVE
Ketones, UA: NEGATIVE
Protein, UA: NEGATIVE
Spec Grav, UA: 1.01
Urobilinogen, UA: NEGATIVE
pH, UA: 6.5

## 2012-08-29 LAB — HEMOGLOBIN A1C
Hgb A1c MFr Bld: 5.8 % — ABNORMAL HIGH (ref <5.7)
Mean Plasma Glucose: 120 mg/dL — ABNORMAL HIGH (ref <117)

## 2012-08-29 NOTE — Progress Notes (Signed)
Subjective:    Patient ID: Wendy Rose, female    DOB: Mar 04, 1946, 67 y.o.   MRN: [id number]  HPI and 67 year old white female with complex history. History of fibromyalgia syndrome for many years. History of anxiety/ depression. History of GE reflux, migraine headaches, asthma. History of mitral valve prolapse diagnosed in the 1990s by Dr. Elsie Lincoln. A 2-D echocardiogram showed midsystolic posterior leaflet prolapse and subtle evidence of anterior mitral leaflet prolapse. Took allergy vaccine for a number of years which stopped in 1993.  Regarding fibromyalgia syndrome, she has been followed by Dr. Phylliss Bob and now Dr. Corliss Skains. Basically rheumatology workup consisting of negative ANA, normal CPK, normal sedimentation rate.  History of herpes zoster ophthalmicus September 2011.  Evaluated at [facility] for musculoskeletal weakness. Has had an EMG that showed mild myopathic changes. Muscle biopsy in the past has been nonspecific. 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 She ambulates slowly and has to use her arms to push up on the armrest of the chair to stand.  History of recurrent urinary tract infections. Has been diagnosed with hypotonic bladder and urethra rhinitis by urologist. Was maintained on trimethoprim suppression for while.  Intolerant to Macrobid, sulfa, Ceftin and codeine. Says Macrobid caused elevated liver functions. Says Ceftin caused a rash. Naprosyn causes GI upset. Unable to tolerate Clinoril or sulfa drugs.  Family history: Mother died with complications of a stroke. Father died at age 79 of an MI. One brother died of an MI with history of pacemaker and congenital heart defect. One sister with history of mitral valve prolapse, hypertension, congestive heart failure, and pacemaker.  Social history: She is married. One adult son. Does not smoke.    Review of Systems  Constitutional: Positive for fatigue.  Cardiovascular:       History of palpitations    Musculoskeletal: Positive for myalgias and arthralgias.  Allergic/Immunologic: Positive for environmental allergies.  Neurological:       Muscle weakness  Hematological: Negative.   Psychiatric/Behavioral:       Anxiety depression       Objective:   Physical Exam  Vitals reviewed. Constitutional: She is oriented to person, place, and time. She appears well-developed and well-nourished. No distress.  HENT:  Head: Normocephalic and atraumatic.  Right Ear: External ear normal.  Left Ear: External ear normal.  Nose: Nose normal.  Mouth/Throat: Oropharynx is clear and moist.  Eyes: Conjunctivae normal and EOM are normal. Pupils are equal, round, and reactive to light. Right eye exhibits no discharge. Left eye exhibits no discharge. No scleral icterus.  Neck: Neck supple. No JVD present. No thyromegaly present.  Cardiovascular: Normal rate, regular rhythm, normal heart sounds and intact distal pulses.   No murmur heard. Pulmonary/Chest: Effort normal and breath sounds normal. No respiratory distress. She has no wheezes. She has no rales. She exhibits no tenderness.       Breasts normal female  Abdominal: Soft. Bowel sounds are normal. She exhibits no distension and no mass. There is no tenderness. There is no rebound and no guarding.  Genitourinary:       Bimanual without masses. S/P hysterectomy and uniophorectomy. Pap deferred  Musculoskeletal: Normal range of motion. She exhibits no edema.  Lymphadenopathy:    She has no cervical adenopathy.  Neurological: She is alert and oriented to person, place, and time. She has normal reflexes. No cranial nerve deficit. Coordination normal.       Proximal muscle weakness  Skin: Skin is warm and dry. No rash  noted. She is not diaphoretic.  Psychiatric: Her behavior is normal. Judgment and thought content normal.          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 Jacki Cones treated with Creatine and Co enzyme Q  10 History of fibromyalgia syndrome Depression- refuses counseling Hyperlipidemia Worsening of GERD despite Protonix   Plan: Annual eye exam; baseline PFTs because of myopathy; screen for DM. Not eating correctly because of decreased appetite. Counsel re low fat low carb. Hearing screen normal at 40 decibels bilaterally except misses 1000 HZ tone in left ear. Hx of surgery in left ear many years ago. Start Zocor 10 mg daily and follow up in 6 months with  Lipid panel and liver functions. Check Hgb AIC and H. Pylori. Refer back to GI for uncontrolled GERD    Subjective:   Patient presents for Medicare Annual/Subsequent preventive examination.   Review Past Medical/Family/Social: see EPIC   Risk Factors  Current exercise habits: was told by neurologist she is unable to exercise Dietary issues discussed: low fat low carb  Cardiac risk factors: lipids Depression Screen  (Note: if answer to either of the following is "Yes", a more complete depression screening is indicated)   Over the past two weeks, have you felt down, depressed or hopeless? yes  Over the past two weeks, have you felt little interest or pleasure in doing things? yes Have you lost interest or pleasure in daily life? yes Do you often feel hopeless? yes Do you cry easily over simple problems? No   Activities of Daily Living  In your present state of health, do you have any difficulty performing the following activities?:   Driving? No  Managing money? No  Feeding yourself? No  Getting from bed to chair? No  Climbing a flight of stairs? Yes- feel weak Preparing food and eating?: No  Bathing or showering? No  Getting dressed: No  Getting to the toilet? No  Using the toilet:No  Moving around from place to place: No  In the past year have you fallen or had a near fall?: yes Are you sexually active? yes Do you have more than one partner? No   Hearing Difficulties: No  Do you often ask people to speak up or  repeat themselves? No  Do you experience ringing or noises in your ears? No  Do you have difficulty understanding soft or whispered voices? No  Do you feel that you have a problem with memory? Yes Do you often misplace items? yes  Home Safety:  Do you have a smoke alarm at your residence? Yes Do you have grab bars in the bathroom?no Do you have throw rugs in your house?yes   Cognitive Testing  Alert? Yes Normal Appearance?Yes  Oriented to person? Yes Place? Yes  Time? Yes  Recall of three objects? Yes  Can perform simple calculations? Yes  Displays appropriate judgment?Yes  Can read the correct time from a watch face?Yes   List the Names of Other Physician/Practitioners you currently use:  See referral list for the physicians patient is currently seeing. Dr. Thad Ranger @ [facility]    Review of Systems: See Epic   Objective:     General appearance: Appears stated age and mildly obese  Head: Normocephalic, without obvious abnormality, atraumatic  Eyes: conj clear, EOMi PEERLA  Ears: normal TM's and external ear canals both ears  Nose: Nares normal. Septum midline. Mucosa normal. No drainage or sinus tenderness.  Throat: lips, mucosa, and  tongue normal; teeth and gums normal  Neck: no adenopathy, no carotid bruit, no JVD, supple, symmetrical, trachea midline and thyroid not enlarged, symmetric, no tenderness/mass/nodules  No CVA tenderness.  Lungs: clear to auscultation bilaterally  Breasts: normal appearance, no masses or tenderness,  Heart: regular rate and rhythm, S1, S2 normal, no murmur, click, rub or gallop  Abdomen: soft, non-tender; bowel sounds normal; no masses, no organomegaly  Musculoskeletal: ROM normal in all joints, no crepitus, no deformity, Normal muscle strengthen. Back  is symmetric, no curvature. Skin: Skin color, texture, turgor normal. No rashes or lesions  Lymph nodes: Cervical, supraclavicular, and axillary nodes normal.  Neurologic: CN 2 -12 Normal, Normal  symmetric reflexes. Normal coordination and gait  Psych: Alert & Oriented x 3, Mood appear stable.    Assessment:    Annual wellness medicare exam   Plan:    During the course of the visit the patient was educated and counseled about appropriate screening and preventive services including:   Annual mammogram     Patient Instructions (the written plan) was given to the patient.  Medicare Attestation  I have personally reviewed:  The patient's medical and social history  Their use of alcohol, tobacco or illicit drugs  Their current medications and supplements  The patient's functional ability including ADLs,fall risks, home safety risks, cognitive, and hearing and visual impairment  Diet and physical activities  Evidence for depression or mood disorders  The patient's weight, height, BMI, and visual acuity have been recorded in the chart. I have made referrals, counseling, and provided education to the patient based on review of the above and I have provided the patient with a written personalized care plan for preventive services.

## 2012-09-13 ENCOUNTER — Encounter: Payer: Self-pay | Admitting: Internal Medicine

## 2012-09-13 ENCOUNTER — Ambulatory Visit (INDEPENDENT_AMBULATORY_CARE_PROVIDER_SITE_OTHER): Payer: Medicare Other | Admitting: Internal Medicine

## 2012-09-13 VITALS — BP 124/72 | HR 80 | Ht 71.0 in | Wt 161.8 lb

## 2012-09-13 DIAGNOSIS — K219 Gastro-esophageal reflux disease without esophagitis: Secondary | ICD-10-CM

## 2012-09-13 DIAGNOSIS — Z1211 Encounter for screening for malignant neoplasm of colon: Secondary | ICD-10-CM

## 2012-09-13 MED ORDER — PANTOPRAZOLE SODIUM 40 MG PO TBEC: 40.0000 mg | DELAYED_RELEASE_TABLET | Freq: Two times a day (BID) | ORAL | Status: DC

## 2012-09-13 NOTE — Patient Instructions (Addendum)
We have sent the following medications to your pharmacy for you to pick up at your convenience:  Protonix  We have given you some information on Genella Rife to review

## 2012-09-13 NOTE — Progress Notes (Signed)
HISTORY OF PRESENT ILLNESS:  Wendy Rose is a 67 y.o. female with multiple medical problems as listed below. She has been followed in this office for gastroesophageal reflux disease and IBS. She was last evaluated in the office December 2012. At that time, she was experiencing breakthrough reflux symptoms despite once daily PPI therapy. Also having troubles with IBS. Colon cancer screening. She did however undergo upper endoscopy 07/29/2011. Examination revealed benign fundic gland polyps, and a small hiatal hernia. Otherwise normal examination. We discussed the appropriate way to take PPI. Also discussed the possibility of twice a day PPI therapy for significant breakthrough. She tells me that she has had significant breakthrough symptoms since. She takes antacids frequently which, for the most part, help. No dysphagia unless she takes a handful pills all once. Since her last visit, she was diagnosed with condition known his mitochondrial myopathy, at [facility]. Her GI review of systems is otherwise remarkable for bloating. She has not tried twice a day PPI therapy, stating that her insurance "won't pay for it unless a prescription is twice a day".  REVIEW OF SYSTEMS:  All non-GI ROS negative except for weakness and unsteadiness, arthritis, headaches, cough  Past Medical History  Diagnosis Date  . Fibromyalgia   . MVP (mitral valve prolapse)   . Vertigo   . Anemia   . Anxiety   . heart arrhythmia   . Arthritis   . Chronic headaches   . Depression   . GERD (gastroesophageal reflux disease)   . Hyperlipemia   . IBS (irritable bowel syndrome)   . Pneumonia   . UTI (lower urinary tract infection)     Past Surgical History  Procedure Date  . Tonsillectomy and adenoidectomy   . Tympanoplasty   . Dilation and curettage of uterus   . Total abdominal hysterectomy   . Bladder suspension   . Cesarean section     Social History Wendy Rose  reports that she has never smoked. She has never used  smokeless tobacco. She reports that she does not drink alcohol or use illicit drugs.  family history includes Diabetes in her sister; Heart disease in her brother and father; and Hypertension in her mother.  There is no history of Colon cancer, and Esophageal cancer, and Stomach cancer, and Rectal cancer, .  Allergies  Allergen Reactions  . Cefuroxime Axetil     REACTION: Rash  . Codeine     REACTION: Reaction not known  . Naproxen     REACTION: Reaction not known  . Nitrofurantoin     REACTION: Increase LFT's  . Sulfonamide Derivatives     REACTION: Reaction not known  . Sulindac     REACTION: Reaction not known       PHYSICAL EXAMINATION: Vital signs: BP 124/72  Pulse 80  Ht 5\' 11"  (1.803 m)  Wt 161 lb 12.8 oz (73.392 kg)  BMI 22.57 kg/m2 General: Well-developed, well-nourished, no acute distress HEENT: Sclerae are anicteric, conjunctiva pink. Oral mucosa intact Lungs: Clear Heart: Regular Abdomen: soft, nontender, nondistended, no obvious ascites, no peritoneal signs, normal bowel sounds. No organomegaly. Extremities: No edema Psychiatric: alert and oriented x3. Cooperative    ASSESSMENT:  #1. GERD. Breakthrough symptoms on once daily PPI. Previous upper endoscopy December 2012 as described. No alarm features #2. Colon cancer screening. Again reviewed. Again, patient declined. She understands the risk of colon cancer #3. Multiple medical problems    PLAN:  #1 reflux precautions. Information on GERD provided #2. Antireflux diet.  Antireflux dietary sheet provided #3. Change prescription 2 pantoprazole 40 mg by mouth twice a day. Multiple refills. Appropriate way to take medication reviewed #4. If she desires colon cancer screening, she knows to contact the office to arrange #5. Return to the care of Dr. Lenord Fellers

## 2012-12-26 ENCOUNTER — Other Ambulatory Visit: Payer: Self-pay | Admitting: Internal Medicine

## 2012-12-26 MED ORDER — HYDROCODONE-ACETAMINOPHEN 10-325 MG PO TABS: 1.0000 | ORAL_TABLET | Freq: Three times a day (TID) | ORAL | Status: DC | PRN

## 2012-12-26 NOTE — Telephone Encounter (Signed)
Please refill as previous RX

## 2013-01-26 ENCOUNTER — Other Ambulatory Visit: Payer: Self-pay | Admitting: Internal Medicine

## 2013-01-27 ENCOUNTER — Other Ambulatory Visit: Payer: Self-pay

## 2013-01-27 MED ORDER — ALPRAZOLAM 1 MG PO TABS: 1.0000 mg | ORAL_TABLET | Freq: Every evening | ORAL | Status: DC | PRN

## 2013-02-12 NOTE — Patient Instructions (Addendum)
Continue same medications and return in 6-12 months 

## 2013-02-14 ENCOUNTER — Inpatient Hospital Stay (HOSPITAL_COMMUNITY)
Admission: EM | Admit: 2013-02-14 | Discharge: 2013-02-17 | DRG: 243 | Disposition: A | Discharge status: Home / Self Care | Payer: Medicare Other | Attending: Cardiovascular Disease | Admitting: Cardiovascular Disease

## 2013-02-14 ENCOUNTER — Encounter (HOSPITAL_COMMUNITY): Payer: Self-pay | Admitting: *Deleted

## 2013-02-14 ENCOUNTER — Emergency Department (HOSPITAL_COMMUNITY): Payer: Medicare Other

## 2013-02-14 DIAGNOSIS — F32A Depression, unspecified: Secondary | ICD-10-CM | POA: Diagnosis present

## 2013-02-14 DIAGNOSIS — G729 Myopathy, unspecified: Secondary | ICD-10-CM | POA: Diagnosis present

## 2013-02-14 DIAGNOSIS — E785 Hyperlipidemia, unspecified: Secondary | ICD-10-CM | POA: Diagnosis present

## 2013-02-14 DIAGNOSIS — F329 Major depressive disorder, single episode, unspecified: Secondary | ICD-10-CM | POA: Diagnosis present

## 2013-02-14 DIAGNOSIS — I440 Atrioventricular block, first degree: Secondary | ICD-10-CM | POA: Diagnosis present

## 2013-02-14 DIAGNOSIS — F3289 Other specified depressive episodes: Secondary | ICD-10-CM | POA: Diagnosis present

## 2013-02-14 DIAGNOSIS — M6281 Muscle weakness (generalized): Secondary | ICD-10-CM

## 2013-02-14 DIAGNOSIS — I959 Hypotension, unspecified: Secondary | ICD-10-CM | POA: Diagnosis present

## 2013-02-14 DIAGNOSIS — R001 Bradycardia, unspecified: Secondary | ICD-10-CM

## 2013-02-14 DIAGNOSIS — M797 Fibromyalgia: Secondary | ICD-10-CM

## 2013-02-14 DIAGNOSIS — K219 Gastro-esophageal reflux disease without esophagitis: Secondary | ICD-10-CM | POA: Diagnosis present

## 2013-02-14 DIAGNOSIS — Z7982 Long term (current) use of aspirin: Secondary | ICD-10-CM

## 2013-02-14 DIAGNOSIS — Z833 Family history of diabetes mellitus: Secondary | ICD-10-CM

## 2013-02-14 DIAGNOSIS — Z8679 Personal history of other diseases of the circulatory system: Secondary | ICD-10-CM

## 2013-02-14 DIAGNOSIS — G7289 Other specified myopathies: Secondary | ICD-10-CM

## 2013-02-14 DIAGNOSIS — Z882 Allergy status to sulfonamides status: Secondary | ICD-10-CM

## 2013-02-14 DIAGNOSIS — F411 Generalized anxiety disorder: Secondary | ICD-10-CM | POA: Diagnosis present

## 2013-02-14 DIAGNOSIS — I498 Other specified cardiac arrhythmias: Principal | ICD-10-CM | POA: Diagnosis present

## 2013-02-14 DIAGNOSIS — K589 Irritable bowel syndrome without diarrhea: Secondary | ICD-10-CM | POA: Diagnosis present

## 2013-02-14 DIAGNOSIS — I495 Sick sinus syndrome: Secondary | ICD-10-CM | POA: Diagnosis present

## 2013-02-14 DIAGNOSIS — Z8249 Family history of ischemic heart disease and other diseases of the circulatory system: Secondary | ICD-10-CM

## 2013-02-14 DIAGNOSIS — I059 Rheumatic mitral valve disease, unspecified: Secondary | ICD-10-CM | POA: Diagnosis present

## 2013-02-14 DIAGNOSIS — Z888 Allergy status to other drugs, medicaments and biological substances status: Secondary | ICD-10-CM

## 2013-02-14 DIAGNOSIS — G713 Mitochondrial myopathy, not elsewhere classified: Secondary | ICD-10-CM

## 2013-02-14 DIAGNOSIS — IMO0001 Reserved for inherently not codable concepts without codable children: Secondary | ICD-10-CM | POA: Diagnosis present

## 2013-02-14 DIAGNOSIS — R55 Syncope and collapse: Secondary | ICD-10-CM | POA: Diagnosis present

## 2013-02-14 DIAGNOSIS — N390 Urinary tract infection, site not specified: Secondary | ICD-10-CM | POA: Diagnosis present

## 2013-02-14 DIAGNOSIS — Z950 Presence of cardiac pacemaker: Secondary | ICD-10-CM | POA: Diagnosis not present

## 2013-02-14 DIAGNOSIS — Z79899 Other long term (current) drug therapy: Secondary | ICD-10-CM

## 2013-02-14 HISTORY — DX: Other specified postprocedural states: R11.2

## 2013-02-14 HISTORY — DX: Bradycardia, unspecified: R00.1

## 2013-02-14 HISTORY — DX: Other specified postprocedural states: Z98.890

## 2013-02-14 HISTORY — DX: Presence of cardiac pacemaker: Z95.0

## 2013-02-14 LAB — URINALYSIS, ROUTINE W REFLEX MICROSCOPIC
Bilirubin Urine: NEGATIVE
Hgb urine dipstick: NEGATIVE
Nitrite: POSITIVE — AB
Specific Gravity, Urine: 1.011 (ref 1.005–1.030)
Urobilinogen, UA: 0.2 mg/dL (ref 0.0–1.0)
pH: 7.5 (ref 5.0–8.0)

## 2013-02-14 LAB — CBC WITH DIFFERENTIAL/PLATELET
Basophils Absolute: 0 10*3/uL (ref 0.0–0.1)
Eosinophils Absolute: 0.1 10*3/uL (ref 0.0–0.7)
Eosinophils Relative: 2 % (ref 0–5)
HCT: 35.5 % — ABNORMAL LOW (ref 36.0–46.0)
Lymphocytes Relative: 39 % (ref 12–46)
MCH: 28.7 pg (ref 26.0–34.0)
MCHC: 33.8 g/dL (ref 30.0–36.0)
MCV: 84.9 fL (ref 78.0–100.0)
Monocytes Absolute: 0.7 10*3/uL (ref 0.1–1.0)
Platelets: 218 10*3/uL (ref 150–400)
RDW: 12.9 % (ref 11.5–15.5)

## 2013-02-14 LAB — URINE MICROSCOPIC-ADD ON

## 2013-02-14 LAB — COMPREHENSIVE METABOLIC PANEL
ALT: 19 U/L (ref 0–35)
AST: 20 U/L (ref 0–37)
CO2: 28 mEq/L (ref 19–32)
Calcium: 8.7 mg/dL (ref 8.4–10.5)
Creatinine, Ser: 0.81 mg/dL (ref 0.50–1.10)
GFR calc Af Amer: 85 mL/min — ABNORMAL LOW (ref >90)
GFR calc non Af Amer: 73 mL/min — ABNORMAL LOW (ref >90)
Sodium: 136 mEq/L (ref 135–145)
Total Protein: 6.1 g/dL (ref 6.0–8.3)

## 2013-02-14 LAB — TROPONIN I: Troponin I: <0.3 ng/mL (ref <0.30)

## 2013-02-14 LAB — GLUCOSE, CAPILLARY: Glucose-Capillary: 85 mg/dL (ref 70–99)

## 2013-02-14 MED ORDER — CALCIUM CARBONATE-VITAMIN D 500-200 MG-UNIT PO TABS
1.0000 | ORAL_TABLET | Freq: Every day | ORAL | Status: DC
  Administered 2013-02-15 – 2013-02-17 (×3): 1 via ORAL
  Filled 2013-02-14 (×6): qty 1

## 2013-02-14 MED ORDER — CO-ENZYME Q-10 50 MG PO CAPS: 50.0000 mg | ORAL_CAPSULE | Freq: Two times a day (BID) | ORAL | Status: DC

## 2013-02-14 MED ORDER — SODIUM CHLORIDE 0.9 % IV BOLUS (SEPSIS)
500.0000 mL | Freq: Once | INTRAVENOUS | Status: AC
  Administered 2013-02-14: 23:00:00 via INTRAVENOUS

## 2013-02-14 MED ORDER — HYDROCODONE-ACETAMINOPHEN 5-325 MG PO TABS
1.0000 | ORAL_TABLET | Freq: Four times a day (QID) | ORAL | Status: DC | PRN
  Administered 2013-02-15 – 2013-02-16 (×2): 1 via ORAL
  Administered 2013-02-17 (×2): 2 via ORAL
  Administered 2013-02-17: 1 via ORAL
  Filled 2013-02-14 (×2): qty 1
  Filled 2013-02-14 (×2): qty 2
  Filled 2013-02-14 (×2): qty 1

## 2013-02-14 MED ORDER — SODIUM CHLORIDE 0.9 % IV BOLUS (SEPSIS)
500.0000 mL | Freq: Once | INTRAVENOUS | Status: AC
  Administered 2013-02-14: 500 mL via INTRAVENOUS

## 2013-02-14 MED ORDER — FLUOXETINE HCL 20 MG PO CAPS
40.0000 mg | ORAL_CAPSULE | Freq: Every day | ORAL | Status: DC
  Administered 2013-02-15 – 2013-02-17 (×3): 40 mg via ORAL
  Filled 2013-02-14 (×3): qty 2

## 2013-02-14 MED ORDER — DOPAMINE-DEXTROSE 3.2-5 MG/ML-% IV SOLN
2.0000 ug/kg/min | INTRAVENOUS | Status: DC
  Administered 2013-02-14: 2 ug/kg/min via INTRAVENOUS
  Filled 2013-02-14: qty 250

## 2013-02-14 MED ORDER — PANTOPRAZOLE SODIUM 40 MG PO TBEC
40.0000 mg | DELAYED_RELEASE_TABLET | Freq: Two times a day (BID) | ORAL | Status: DC
  Administered 2013-02-15 – 2013-02-17 (×6): 40 mg via ORAL
  Filled 2013-02-14 (×6): qty 1

## 2013-02-14 MED ORDER — DOPAMINE-DEXTROSE 3.2-5 MG/ML-% IV SOLN
3.0000 ug/kg/min | INTRAVENOUS | Status: DC
Start: 2013-02-15 — End: 2013-02-16
  Administered 2013-02-16: 3 ug/kg/min via INTRAVENOUS
  Filled 2013-02-14: qty 250

## 2013-02-14 MED ORDER — PIPERACILLIN-TAZOBACTAM 3.375 G IVPB 30 MIN
3.3750 g | Freq: Once | INTRAVENOUS | Status: AC
  Administered 2013-02-14: 3.375 g via INTRAVENOUS
  Filled 2013-02-14: qty 50

## 2013-02-14 MED ORDER — ASPIRIN EC 81 MG PO TBEC
81.0000 mg | DELAYED_RELEASE_TABLET | Freq: Every day | ORAL | Status: DC
  Administered 2013-02-15: 81 mg via ORAL
  Filled 2013-02-14 (×4): qty 1

## 2013-02-14 MED ORDER — CIPROFLOXACIN IN D5W 400 MG/200ML IV SOLN
400.0000 mg | Freq: Once | INTRAVENOUS | Status: AC
  Administered 2013-02-14: 400 mg via INTRAVENOUS
  Filled 2013-02-14: qty 200

## 2013-02-14 MED ORDER — B COMPLEX-C PO TABS
1.0000 | ORAL_TABLET | Freq: Every day | ORAL | Status: DC
  Administered 2013-02-15 – 2013-02-17 (×3): 1 via ORAL
  Filled 2013-02-14 (×3): qty 1

## 2013-02-14 MED ORDER — TRAMADOL HCL 50 MG PO TABS
50.0000 mg | ORAL_TABLET | Freq: Four times a day (QID) | ORAL | Status: DC | PRN
  Administered 2013-02-16 (×2): 50 mg via ORAL
  Filled 2013-02-14 (×2): qty 1

## 2013-02-14 MED ORDER — SODIUM CHLORIDE 0.9 % IV BOLUS (SEPSIS)
1000.0000 mL | Freq: Once | INTRAVENOUS | Status: AC
  Administered 2013-02-14: 1000 mL via INTRAVENOUS

## 2013-02-14 MED ORDER — BUPROPION HCL ER (XL) 150 MG PO TB24
150.0000 mg | ORAL_TABLET | Freq: Every day | ORAL | Status: DC
Start: 2013-02-15 — End: 2013-02-17
  Administered 2013-02-15 – 2013-02-17 (×3): 150 mg via ORAL
  Filled 2013-02-14 (×3): qty 1

## 2013-02-14 MED ORDER — ALPRAZOLAM 0.5 MG PO TABS
1.0000 mg | ORAL_TABLET | Freq: Every evening | ORAL | Status: DC | PRN
  Administered 2013-02-16: 0.5 mg via ORAL
  Administered 2013-02-16: 1 mg via ORAL
  Filled 2013-02-14: qty 2
  Filled 2013-02-14: qty 1

## 2013-02-14 NOTE — ED Provider Notes (Signed)
History    CSN: [id number] Arrival date & time 02/14/13  1441  First MD Initiated Contact with Patient 02/14/13 1503     Chief Complaint  Patient presents with  . Hypotension  . Bradycardia   (Consider location/radiation/quality/duration/timing/severity/associated sxs/prior Treatment) HPI Comments: Patient with history of mitochondrial myopathy, FM, MVP.  Presents with complaints of weakness, no energy, and near syncope that started abruptly this afternoon.  She was getting ready to go out of the house.  When she stood to leave, she suddenly became very fatigued and weak.  Her husband checked her blood pressure and it was found to be low as was her heart rate.  She denies any chest pain or shortness of breath.  No fevers or chills.  No n/v/d.  She is not on any beta blockers or blood pressure medications.    The history is provided by the patient.   Past Medical History  Diagnosis Date  . Fibromyalgia   . MVP (mitral valve prolapse)   . Vertigo   . Anemia   . Anxiety   . heart arrhythmia   . Arthritis   . Chronic headaches   . Depression   . GERD (gastroesophageal reflux disease)   . Hyperlipemia   . IBS (irritable bowel syndrome)   . Pneumonia   . UTI (lower urinary tract infection)    Past Surgical History  Procedure Laterality Date  . Tonsillectomy and adenoidectomy    . Tympanoplasty    . Dilation and curettage of uterus    . Total abdominal hysterectomy    . Bladder suspension    . Cesarean section     Family History  Problem Relation Age of Onset  . Hypertension Mother   . Heart disease Father   . Diabetes Sister   . Heart disease Brother   . Colon cancer Neg Hx   . Esophageal cancer Neg Hx   . Stomach cancer Neg Hx   . Rectal cancer Neg Hx    History  Substance Use Topics  . Smoking status: Never Smoker   . Smokeless tobacco: Never Used  . Alcohol Use: No   OB History   Grav Para Term Preterm Abortions TAB SAB Ect Mult Living                  Review of Systems  All other systems reviewed and are negative.    Allergies  Cefuroxime axetil; Codeine; Naproxen; Nitrofurantoin; Sulfonamide derivatives; and Sulindac  Home Medications   Current Outpatient Rx  Name  Route  Sig  Dispense  Refill  . ALPRAZolam (XANAX) 1 MG tablet   Oral   Take 1 tablet (1 mg total) by mouth at bedtime as needed.   60 tablet   2   . ASPIRIN PO   Oral   Take 81 mg by mouth.          Marland Kitchen buPROPion (WELLBUTRIN XL) 150 MG 24 hr tablet      take 1 tablet by mouth every morning   30 tablet   11   . CALCIUM PO   Oral   Take by mouth.           . Cholecalciferol (VITAMIN D PO)   Oral   Take by mouth.           . co-enzyme Q-10 50 MG capsule   Oral   Take 50 mg by mouth 2 (two) times daily.         Marland Kitchen  Creatinine POWD   Oral   Take by mouth.         Marland Kitchen FLUoxetine (PROZAC) 40 MG capsule   Oral   Take 1 capsule (40 mg total) by mouth daily.   30 capsule   PRN   . HYDROcodone-acetaminophen (LORCET) 10-650 MG per tablet      take 1 tablet by mouth every 8 hours if needed for pain   90 tablet   3   . HYDROcodone-acetaminophen (NORCO) 10-325 MG per tablet   Oral   Take 1 tablet by mouth every 8 (eight) hours as needed for pain.   90 tablet   3   . Magnesium Oxide (MAG-200 PO)   Oral   Take by mouth.           . methocarbamol (ROBAXIN) 500 MG tablet      prn         . metoprolol (LOPRESSOR) 50 MG tablet   Oral   Take 1 tablet (50 mg total) by mouth daily. 1/2 tablet daily   1 tablet   5     1/2 tablet daily   . pantoprazole (PROTONIX) 40 MG tablet   Oral   Take 1 tablet (40 mg total) by mouth 2 (two) times daily.   60 tablet   6   . Thiamine HCl (THIAMINE PO)   Oral   Take by mouth.           . TRAMADOL HCL PO   Oral   Take by mouth. 50MG           BP 70/48  Pulse 42  Temp(Src) 97.6 F (36.4 C) (Oral)  Resp 16  SpO2 96% Physical Exam  Constitutional: She is oriented to person, place,  and time. She appears well-developed and well-nourished. No distress.  HENT:  Head: Normocephalic and atraumatic.  Mouth/Throat: Oropharynx is clear and moist.  Eyes: EOM are normal. Pupils are equal, round, and reactive to light.  Neck: Normal range of motion. Neck supple.  Cardiovascular: Normal heart sounds.   No murmur heard. Bradycardic.  Pulmonary/Chest: Breath sounds normal. No respiratory distress. She has no wheezes.  Abdominal: Soft. Bowel sounds are normal. She exhibits no distension. There is no tenderness.  Musculoskeletal: Normal range of motion. She exhibits no edema.  Lymphadenopathy:    She has no cervical adenopathy.  Neurological: She is alert and oriented to person, place, and time. No cranial nerve deficit. She exhibits normal muscle tone. Coordination normal.  Skin: Skin is warm and dry. She is not diaphoretic.    ED Course  Procedures (including critical care time) Labs Reviewed  GLUCOSE, CAPILLARY  CBC WITH DIFFERENTIAL  COMPREHENSIVE METABOLIC PANEL  URINALYSIS, ROUTINE W REFLEX MICROSCOPIC  TROPONIN I   No results found. No diagnosis found.   Date: 02/14/2013  Rate: 42  Rhythm: sinus bradycardia  QRS Axis: normal  Intervals: normal  ST/T Wave abnormalities: normal  Conduction Disutrbances:first-degree A-V block   Narrative Interpretation:   Old EKG Reviewed: none available    MDM  The patient presents here with weakness, fatigue, and was found here to be hypotensive and bradycardic with a first degree av block on her ekg.  She was given iv hydration which did improve her blood pressure somewhat.  She is protecting her airway and is mentating appropriately and I have not seen an indication to start pacing.  The laboratory tests all appear to be unremarkable and the troponin is negative.  She is not on  any antihypertensives that would be the cause of this, and there are no electrolyte abnormalities as well.  I have spoken with [facility] who will see the  patient in the ED.  I am in the process of administrating an additional 500 cc of saline.  If this does not correct her hypotension, I will consider starting pressors.  I do not see an indication for trancutaneous pacing at this point.     CRITICAL CARE Performed by: Geoffery Lyons Total critical care time: 30 minutes Critical care time was exclusive of separately billable procedures and treating other patients. Critical care was necessary to treat or prevent imminent or life-threatening deterioration. Critical care was time spent personally by me on the following activities: development of treatment plan with patient and/or surrogate as well as nursing, discussions with consultants, evaluation of patient's response to treatment, examination of patient, obtaining history from patient or surrogate, ordering and performing treatments and interventions, ordering and review of laboratory studies, ordering and review of radiographic studies, pulse oximetry and re-evaluation of patient's condition.    Geoffery Lyons, MD 02/14/13 [phone]

## 2013-02-14 NOTE — Progress Notes (Addendum)
Called by nurse at [facility] - they are preparing patient for transport to [facility], but she remains hypotensive with systolics in the 70s and bradycardic to 40s.   I ordered low dose dopamine to start prior to transfer, and asked that she be monitored for 1 hour prior to transfer.  Start at , increase to as tolerated prior to transfer.   Also ordered 500cc NS.  Call with questions.  Yaakov Guthrie, MD [phone]

## 2013-02-14 NOTE — Progress Notes (Signed)
Patient declined Mychart activation at this time-not interested

## 2013-02-14 NOTE — ED Notes (Signed)
Pt A&Ox's 4. Reports feeling fatigued. Current VS BP 90/44, HR 41, 02 97% RA

## 2013-02-14 NOTE — ED Notes (Signed)
Report called to [facility] at [facility]

## 2013-02-14 NOTE — ED Notes (Signed)
Pt reports she feels fatigued, reports checking BP at home and noticing it was "low"

## 2013-02-14 NOTE — ED Notes (Signed)
Cipro stopped due to patient's arm itching.  Dr. Judd Lien notified.

## 2013-02-14 NOTE — H&P (Signed)
Patient ID: Wendy Rose MRN: [id number], DOB/AGE: August 31, 1945   Admit date: 02/14/2013   Primary Physician: Margaree Mackintosh, MD Primary Cardiologist: Dr Tresa Endo (new)  HPI: 67 y/o female with no prior serious cardiology problems though she tells me she has seen several different cardiologis and had stress test in the past- all normal.  She has never had a coronary angiogram. She was told in the past she had MVP. She does have medical issues including mitochondrial disorder, fibromyalgia, IBS, depression and anxiety. She has chronic weakness, "good days and bad days". Today she was getting ready to leave the house with her husband and felt weak. She continued to get ready and had a near syncopal spell. She was admitted to [facility] ER she was found to be bradycardic with HR is the 30s and hypotensive. She is tolerating this well currently. She denies angina or dyspnea. She had prescribed Metoprolol in the past for MVP and palpitations but has not taken this recently.   Problem List: Past Medical History  Diagnosis Date  . Fibromyalgia   . MVP (mitral valve prolapse)   . Vertigo   . Anemia   . Anxiety   . heart arrhythmia   . Arthritis   . Chronic headaches   . Depression   . GERD (gastroesophageal reflux disease)   . Hyperlipemia   . IBS (irritable bowel syndrome)   . Pneumonia   . UTI (lower urinary tract infection)   . PONV (postoperative nausea and vomiting)     Past Surgical History  Procedure Laterality Date  . Tonsillectomy and adenoidectomy    . Tympanoplasty    . Dilation and curettage of uterus    . Total abdominal hysterectomy    . Bladder suspension    . Cesarean section       Allergies:  Allergies  Allergen Reactions  . Cefuroxime Axetil     REACTION: Rash  . Codeine     REACTION: Reaction not known  . Naproxen     REACTION: Reaction not known  . Nitrofurantoin     REACTION: Increase LFT's  . Sulfonamide Derivatives     REACTION: Reaction not known  . Sulindac      REACTION: Reaction not known     Home Medications Current Facility-Administered Medications  Medication Dose Route Frequency Provider Last Rate Last Dose  . piperacillin-tazobactam (ZOSYN) IVPB 3.375 g  3.375 g Intravenous Once Geoffery Lyons, MD 100 mL/hr at 02/14/13 1954 3.375 g at 02/14/13 1954  . pneumococcal 23 valent vaccine (PNU-IMMUNE) injection 0.5 mL  0.5 mL Intramuscular Once Margaree Mackintosh, MD       Current Outpatient Prescriptions  Medication Sig Dispense Refill  . ALPRAZolam (XANAX) 1 MG tablet Take 1 tablet (1 mg total) by mouth at bedtime as needed.  60 tablet  2  . aspirin EC 81 MG tablet Take 81 mg by mouth at bedtime.      . B Complex-C (B-COMPLEX WITH VITAMIN C) tablet Take 1 tablet by mouth daily.      Marland Kitchen buPROPion (WELLBUTRIN XL) 150 MG 24 hr tablet take 1 tablet by mouth every morning  30 tablet  11  . calcium-vitamin D (CALCIUM 500+D) 500-200 MG-UNIT per tablet Take 1 tablet by mouth daily with breakfast.      . co-enzyme Q-10 50 MG capsule Take 50 mg by mouth 2 (two) times daily.      . Creatinine POWD Take by mouth.      Marland Kitchen FLUoxetine (PROZAC)  40 MG capsule Take 1 capsule (40 mg total) by mouth daily.  30 capsule  PRN  . HYDROcodone-acetaminophen (LORCET) 10-650 MG per tablet take 1 tablet by mouth every 8 hours if needed for pain  90 tablet  3  . HYDROcodone-acetaminophen (NORCO) 10-325 MG per tablet Take 1 tablet by mouth every 8 (eight) hours as needed for pain.  90 tablet  3  . Magnesium Oxide (MAG-200 PO) Take by mouth.        . pantoprazole (PROTONIX) 40 MG tablet Take 1 tablet (40 mg total) by mouth 2 (two) times daily.  60 tablet  6  . Thiamine HCl (THIAMINE PO) Take by mouth.       . TRAMADOL HCL PO Take by mouth. 50MG          Family History  Problem Relation Age of Onset  . Hypertension Mother   . Heart disease Father   . Diabetes Sister   . Heart disease Brother   . Colon cancer Neg Hx   . Esophageal cancer Neg Hx   . Stomach cancer Neg Hx   .  Rectal cancer Neg Hx      History   Social History  . Marital Status: Married    Spouse Name: N/A    Number of Children: N/A  . Years of Education: N/A   Occupational History  . Not on file.   Social History Main Topics  . Smoking status: Never Smoker   . Smokeless tobacco: Never Used  . Alcohol Use: No  . Drug Use: No  . Sexually Active: Yes   Other Topics Concern  . Not on file   Social History Narrative  . No narrative on file     Review of Systems: General: negative for chills, fever, night sweats or weight changes.  Cardiovascular: negative for chest pain, dyspnea on exertion, edema, orthopnea, palpitations, paroxysmal nocturnal dyspnea or shortness of breath Dermatological: negative for rash Respiratory: negative for cough or wheezing Urologic: negative for hematuria Abdominal: negative for nausea, vomiting, diarrhea, bright red blood per rectum, melena, or hematemesis Neurologic: negative for visual changes, syncope, or dizziness All other systems reviewed and are otherwise negative except as noted above.  Physical Exam: Blood pressure 95/49, pulse 43, temperature 97.6 F (36.4 C), temperature source Oral, resp. rate 12, SpO2 99.00%.  General appearance: alert, cooperative, appears stated age and no distress Neck: no carotid bruit and no JVD Lungs: clear to auscultation bilaterally Heart: regular rythm, slow rate Abdomen: soft, non-tender; bowel sounds normal; no masses,  no organomegaly Extremities: extremities normal, atraumatic, no cyanosis or edema Pulses: 2+ and symmetric Skin: Skin color, texture, turgor normal. No rashes or lesions Neurologic: Grossly normal    Labs:   Results for orders placed during the hospital encounter of 02/14/13 (from the past 24 hour(s))  GLUCOSE, CAPILLARY     Status: None   Collection Time    02/14/13  2:58 PM      Result Value Range   Glucose-Capillary 85  70 - 99 mg/dL  CBC WITH DIFFERENTIAL     Status: Abnormal    Collection Time    02/14/13  3:08 PM      Result Value Range   WBC 6.7  4.0 - 10.5 K/uL   RBC 4.18  3.87 - 5.11 MIL/uL   Hemoglobin 12.0  12.0 - 15.0 g/dL   HCT 16.1 (*) 09.6 - 04.5 %   MCV 84.9  78.0 - 100.0 fL   MCH 28.7  26.0 - 34.0 pg   MCHC 33.8  30.0 - 36.0 g/dL   RDW 44.0  10.2 - 72.5 %   Platelets 218  150 - 400 K/uL   Neutrophils Relative % 49  43 - 77 %   Neutro Abs 3.2  1.7 - 7.7 K/uL   Lymphocytes Relative 39  12 - 46 %   Lymphs Abs 2.6  0.7 - 4.0 K/uL   Monocytes Relative 11  3 - 12 %   Monocytes Absolute 0.7  0.1 - 1.0 K/uL   Eosinophils Relative 2  0 - 5 %   Eosinophils Absolute 0.1  0.0 - 0.7 K/uL   Basophils Relative 0  0 - 1 %   Basophils Absolute 0.0  0.0 - 0.1 K/uL  COMPREHENSIVE METABOLIC PANEL     Status: Abnormal   Collection Time    02/14/13  3:08 PM      Result Value Range   Sodium 136  135 - 145 mEq/L   Potassium 4.8  3.5 - 5.1 mEq/L   Chloride 99  96 - 112 mEq/L   CO2 28  19 - 32 mEq/L   Glucose, Bld 92  70 - 99 mg/dL   BUN 19  6 - 23 mg/dL   Creatinine, Ser 3.66  0.50 - 1.10 mg/dL   Calcium 8.7  8.4 - 44.0 mg/dL   Total Protein 6.1  6.0 - 8.3 g/dL   Albumin 3.4 (*) 3.5 - 5.2 g/dL   AST 20  0 - 37 U/L   ALT 19  0 - 35 U/L   Alkaline Phosphatase 73  39 - 117 U/L   Total Bilirubin 0.3  0.3 - 1.2 mg/dL   GFR calc non Af Amer 73 (*) >90 mL/min   GFR calc Af Amer 85 (*) >90 mL/min  TROPONIN I     Status: None   Collection Time    02/14/13  3:08 PM      Result Value Range   Troponin I <0.30  <0.30 ng/mL  URINALYSIS, ROUTINE W REFLEX MICROSCOPIC     Status: Abnormal   Collection Time    02/14/13  4:29 PM      Result Value Range   Color, Urine YELLOW  YELLOW   APPearance CLEAR  CLEAR   Specific Gravity, Urine 1.011  1.005 - 1.030   pH 7.5  5.0 - 8.0   Glucose, UA NEGATIVE  NEGATIVE mg/dL   Hgb urine dipstick NEGATIVE  NEGATIVE   Bilirubin Urine NEGATIVE  NEGATIVE   Ketones, ur NEGATIVE  NEGATIVE mg/dL   Protein, ur NEGATIVE  NEGATIVE  mg/dL   Urobilinogen, UA 0.2  0.0 - 1.0 mg/dL   Nitrite POSITIVE (*) NEGATIVE   Leukocytes, UA TRACE (*) NEGATIVE  URINE MICROSCOPIC-ADD ON     Status: Abnormal   Collection Time    02/14/13  4:29 PM      Result Value Range   WBC, UA 3-6  <3 WBC/hpf   Bacteria, UA FEW (*) RARE     Radiology/Studies: Dg Chest Port 1 View  02/14/2013   *RADIOLOGY REPORT*  Clinical Data: Weakness and bradycardia  PORTABLE CHEST - 1 VIEW  Comparison: None  Findings: Heart size appears within normal limits.  No pleural effusion or edema.  No airspace consolidation.  Review of the visualized osseous structures is unremarkable.  IMPRESSION:  1.  No acute cardiopulmonary abnormalities.   Original Report Authenticated By: Signa Kell, M.D.    EKG:  NSR/SB/  1st AVB  ASSESSMENT AND PLAN:  Principal Problem:   Near syncope Active Problems:   Bradycardia- symptomatic   Hypotension   Mitochondrial myopathy   Proximal muscle weakness   Hyperlipidemia   Depression   Fibromyalgia syndrome   History of mitral valve prolapse   GE reflux   PLAN: Dr Tresa Endo to see. Consider transfer to [facility] and starting low dose Dopamine. She will probably require a pacemaker at some point.   Deland Pretty, PA-C 02/14/2013, 8:23 PM   Patient seen and examined. Agree with assessment and plan. Very pleasant 67 yo WF with a history of mitochondrial myopathy diagnosed at [facility] and confirmed at [place]. She presents tonight with presyncope, no chest pain. HR bradycardic initially with sinus arrythmia pulse in 40's with first degree AV block, and sinus arrythmia with occasional junctional beats. BP was initially in the 70's, now improved with IV fluids to > 100.  Will admit but send to [facility] in event pacemaker is necessary.  Will check echo in am, f/u labs with thyroid. Will start low dose dopamine.   Lennette Bihari, MD, [facility] 02/14/2013 8:46 PM

## 2013-02-14 NOTE — ED Notes (Signed)
Patient to be transferred to [facility], awaiting bed assignment.

## 2013-02-14 NOTE — Progress Notes (Signed)
PHARMACIST - PHYSICIAN ORDER COMMUNICATION  CONCERNING: P&T Medication Policy on Herbal Medications  DESCRIPTION:  This patient's order for:  Co-enzyme Q10 has been noted.  This product(s) is classified as an "herbal" or natural product. Due to a lack of definitive safety studies or FDA approval, nonstandard manufacturing practices, plus the potential risk of unknown drug-drug interactions while on inpatient medications, the Pharmacy and Therapeutics Committee does not permit the use of "herbal" or natural products of this type within [place].   ACTION TAKEN: The pharmacy department is unable to verify this order at this time and your patient has been informed of this safety policy. Please reevaluate patient's clinical condition at discharge and address if the herbal or natural product(s) should be resumed at that time.   

## 2013-02-15 DIAGNOSIS — Z8679 Personal history of other diseases of the circulatory system: Secondary | ICD-10-CM

## 2013-02-15 DIAGNOSIS — I059 Rheumatic mitral valve disease, unspecified: Secondary | ICD-10-CM

## 2013-02-15 LAB — TROPONIN I: Troponin I: <0.3 ng/mL (ref <0.30)

## 2013-02-15 LAB — CK TOTAL AND CKMB (NOT AT ARMC): Total CK: 64 U/L (ref 7–177)

## 2013-02-15 LAB — BASIC METABOLIC PANEL
BUN: 13 mg/dL (ref 6–23)
CO2: 24 mEq/L (ref 19–32)
Calcium: 8.7 mg/dL (ref 8.4–10.5)
Chloride: 107 mEq/L (ref 96–112)
Creatinine, Ser: 0.52 mg/dL (ref 0.50–1.10)
GFR calc Af Amer: >90 mL/min (ref >90)
GFR calc non Af Amer: >90 mL/min (ref >90)
Glucose, Bld: 106 mg/dL — ABNORMAL HIGH (ref 70–99)
Potassium: 3.9 mEq/L (ref 3.5–5.1)
Sodium: 140 mEq/L (ref 135–145)

## 2013-02-15 LAB — TSH: TSH: 0.632 u[IU]/mL (ref 0.350–4.500)

## 2013-02-15 LAB — SEDIMENTATION RATE: Sed Rate: 4 mm/hr (ref 0–22)

## 2013-02-15 MED ORDER — ONDANSETRON HCL 4 MG/2ML IJ SOLN
4.0000 mg | Freq: Four times a day (QID) | INTRAMUSCULAR | Status: DC | PRN
  Administered 2013-02-16: 4 mg via INTRAVENOUS
  Filled 2013-02-15: qty 2

## 2013-02-15 MED ORDER — KETOROLAC TROMETHAMINE 15 MG/ML IJ SOLN
15.0000 mg | Freq: Four times a day (QID) | INTRAMUSCULAR | Status: DC | PRN
  Administered 2013-02-15: 15 mg via INTRAVENOUS
  Filled 2013-02-15: qty 1

## 2013-02-15 MED ORDER — ASPIRIN 81 MG PO CHEW
CHEWABLE_TABLET | ORAL | Status: AC
  Administered 2013-02-15: 81 mg
  Filled 2013-02-15: qty 1

## 2013-02-15 NOTE — Progress Notes (Signed)
  Echocardiogram 2D Echocardiogram has been performed.  Arvil Chaco 02/15/2013, 1:30 PM

## 2013-02-15 NOTE — Care Management Note (Signed)
    Page 1 of 1   02/15/2013     10:16:33 AM   CARE MANAGEMENT NOTE 02/15/2013  Patient:  Wendy Rose, Wendy Rose   Account Number:  [id number]  Date Initiated:  02/15/2013  Documentation initiated by:  Junius Creamer  Subjective/Objective Assessment:   adm w hypotention     Action/Plan:   lives w husband, pcp dr Corrie Dandy baxley   Anticipated DC Date:     Anticipated DC Plan:        DC Planning Services  CM consult      Choice offered to / List presented to:             Status of service:   Medicare Important Message given?   (If response is "NO", the following Medicare IM given date fields will be blank) Date Medicare IM given:   Date Additional Medicare IM given:    Discharge Disposition:    Per UR Regulation:  Reviewed for med. necessity/level of care/duration of stay  If discussed at Long Length of Stay Meetings, dates discussed:    Comments:

## 2013-02-15 NOTE — Progress Notes (Signed)
eLink Physician-Brief Progress Note Patient Name: Wendy Rose DOB: 02-01-1946 MRN: [id number]  Date of Service  02/15/2013   HPI/Events of Note  Best Practice   eICU Interventions  Order for SCDs while patient is on bedrest and until fully ambulatory.   Intervention Category Intermediate Interventions: Best-practice therapies (e.g. DVT, beta blocker, etc.)  Hamp Moreland 02/15/2013, 5:00 AM

## 2013-02-15 NOTE — Progress Notes (Signed)
[facility]  Subjective: Pt does not feel much better than yesterday. Still feels weak and tired. Hasn't been out of bed.   Objective: Vital signs in last 24 hours: Temp:  [97.4 F (36.3 C)-98.4 F (36.9 C)] 98.2 F (36.8 C) (07/09 0755) Pulse Rate:  [40-56] 44 (07/09 0800) Resp:  [9-20] 12 (07/09 0800) BP: (70-162)/(31-67) 122/54 mmHg (07/09 0800) SpO2:  [94 %-100 %] 98 % (07/09 0800) Weight:  [161 lb 13.1 oz (73.4 kg)-164 lb 3.9 oz (74.5 kg)] 164 lb 3.9 oz (74.5 kg) (07/08 2345)    Intake/Output from previous day: 07/08 0701 - 07/09 0700 In: 50.4 [I.V.:50.4] Out: 600 [Urine:600] Intake/Output this shift: Total I/O In: 7.7 [I.V.:7.7] Out: -   Medications Current Facility-Administered Medications  Medication Dose Route Frequency Provider Last Rate Last Dose  . ALPRAZolam Prudy Feeler) tablet 1 mg  1 mg Oral QHS PRN Abelino Derrick, PA-C      . aspirin EC tablet 81 mg  81 mg Oral QHS Abelino Derrick, PA-C      . B-complex with vitamin C tablet 1 tablet  1 tablet Oral Daily Luke K Kilroy, PA-C      . buPROPion (WELLBUTRIN XL) 24 hr tablet 150 mg  150 mg Oral Daily Luke K Kilroy, PA-C      . calcium-vitamin D (OSCAL WITH D) 500-200 MG-UNIT per tablet 1 tablet  1 tablet Oral Q breakfast Abelino Derrick, PA-C      . DOPamine (INTROPIN) 800 mg in dextrose 5 % 250 mL infusion  3 mcg/kg/min Intravenous Titrated Abelino Derrick, PA-C 7 mL/hr at 02/15/13 0437 5 mcg/kg/min at 02/15/13 0437  . FLUoxetine (PROZAC) capsule 40 mg  40 mg Oral Daily Eda Paschal [place], [place]      . HYDROcodone-acetaminophen (NORCO/VICODIN) 5-325 MG per tablet 1-2 tablet  1-2 tablet Oral Q6H PRN Abelino Derrick, PA-C      . ketorolac (TORADOL) 15 MG/ML injection 15 mg  15 mg Intravenous Q6H PRN Yaakov Guthrie, MD   15 mg at 02/15/13 0525  . ondansetron (ZOFRAN) injection 4 mg  4 mg Intravenous Q6H PRN Yaakov Guthrie, MD      . pantoprazole (PROTONIX) EC tablet 40 mg  40 mg Oral BID Eda Paschal Kilroy, PA-C    40 mg at 02/15/13 0005  . traMADol (ULTRAM) tablet 50 mg  50 mg Oral Q6H PRN Abelino Derrick, PA-C        PE: General appearance: alert, cooperative and no distress Lungs: clear to auscultation bilaterally Heart: slow rate, regular rhythm Extremities: no LEE Pulses: 2+ and symmetric Skin: warm and dry Neurologic: Grossly normal  Lab Results:   Recent Labs  02/14/13 1508  WBC 6.7  HGB 12.0  HCT 35.5*  PLT 218   BMET  Recent Labs  02/14/13 1508 02/15/13 0420  NA 136 140  K 4.8 3.9  CL 99 107  CO2 28 24  GLUCOSE 92 106*  BUN 19 13  CREATININE 0.81 0.52  CALCIUM 8.7 8.7    Assessment/Plan    Principal Problem:   Near syncope Active Problems:   Hyperlipidemia   Depression   Fibromyalgia syndrome   History of mitral valve prolapse   GE reflux   Mitochondrial myopathy   Proximal muscle weakness   Bradycardia- symptomatic   Hypotension  Plan: BP improved with IVF. Most recent BP is 122/54. Dopamine was started yesterday and HR improved slightly, however she is still bradycardic. Current  HR in the upper 40s. She has been bed bound. She still feels weak and fatigued. ? Increasing Dopamine. ? Need for PPM. ? Hypothyroid. TSH is in process. Will follow result. Dr. Tresa Endo to follow.     LOS: 1 day    Brittainy M. Sharol Harness, PA-C 02/15/2013 8:16 AM   Patient seen and examined. Agree with assessment and plan. Pt transferred to [facility] from [facility] last evening. Currently on dopamine at 3 mics/kg; BP 115, pulse 50 with 1st AV block, sinus arrythmia. Also some junctional arrhytmia yesterday. Pt with diagnosis of mitochondrial myopathy. ? If conduction abnormalities related as in KSS varient (Kearns-Sayre Syndrome). Pt has some difficulty with vision, but no ptosis. Pt reportedly also has a history of MVP. Will try to wean and dc dopamine today and re-assess conduction and potential need for pacemaker.  Will check ESR, CPK, aldolase, urine for myoglobin, TSH, cardiac  enzymes.   Lennette Bihari, MD, [facility] 02/15/2013 8:41 AM

## 2013-02-16 ENCOUNTER — Encounter (HOSPITAL_COMMUNITY)
Admission: EM | Disposition: A | Discharge status: Home / Self Care | Payer: Self-pay | Source: Home / Self Care | Attending: Cardiovascular Disease

## 2013-02-16 ENCOUNTER — Encounter (HOSPITAL_COMMUNITY): Payer: Self-pay | Admitting: *Deleted

## 2013-02-16 DIAGNOSIS — I495 Sick sinus syndrome: Secondary | ICD-10-CM

## 2013-02-16 DIAGNOSIS — I498 Other specified cardiac arrhythmias: Principal | ICD-10-CM

## 2013-02-16 HISTORY — PX: PACEMAKER INSERTION: SHX728

## 2013-02-16 HISTORY — PX: PERMANENT PACEMAKER INSERTION: SHX5480

## 2013-02-16 LAB — URINE CULTURE

## 2013-02-16 LAB — TROPONIN I: Troponin I: <0.3 ng/mL (ref <0.30)

## 2013-02-16 SURGERY — PERMANENT PACEMAKER INSERTION
Anesthesia: LOCAL

## 2013-02-16 MED ORDER — ONDANSETRON HCL 4 MG/2ML IJ SOLN: 4.0000 mg | Freq: Four times a day (QID) | INTRAMUSCULAR | Status: DC | PRN

## 2013-02-16 MED ORDER — SODIUM CHLORIDE 0.9 % IV SOLN: INTRAVENOUS | Status: DC

## 2013-02-16 MED ORDER — ACETAMINOPHEN 325 MG PO TABS
650.0000 mg | ORAL_TABLET | Freq: Four times a day (QID) | ORAL | Status: DC | PRN
  Administered 2013-02-16: 650 mg via ORAL
  Filled 2013-02-16: qty 2

## 2013-02-16 MED ORDER — FENTANYL CITRATE 0.05 MG/ML IJ SOLN
INTRAMUSCULAR | Status: AC
  Filled 2013-02-16: qty 2

## 2013-02-16 MED ORDER — CHLORHEXIDINE GLUCONATE 4 % EX LIQD
60.0000 mL | Freq: Once | CUTANEOUS | Status: DC
  Filled 2013-02-16: qty 15

## 2013-02-16 MED ORDER — ASPIRIN 81 MG PO CHEW
CHEWABLE_TABLET | ORAL | Status: AC
  Filled 2013-02-16: qty 1

## 2013-02-16 MED ORDER — CIPROFLOXACIN IN D5W 400 MG/200ML IV SOLN
400.0000 mg | Freq: Two times a day (BID) | INTRAVENOUS | Status: DC
  Filled 2013-02-16 (×3): qty 200

## 2013-02-16 MED ORDER — SODIUM CHLORIDE 0.9 % IJ SOLN
3.0000 mL | Freq: Two times a day (BID) | INTRAMUSCULAR | Status: DC
  Administered 2013-02-17: 3 mL via INTRAVENOUS

## 2013-02-16 MED ORDER — SODIUM CHLORIDE 0.9 % IV SOLN
INTRAVENOUS | Status: DC
  Administered 2013-02-16: 14:00:00 via INTRAVENOUS

## 2013-02-16 MED ORDER — VANCOMYCIN HCL IN DEXTROSE 1-5 GM/200ML-% IV SOLN
1000.0000 mg | INTRAVENOUS | Status: DC
  Filled 2013-02-16: qty 200

## 2013-02-16 MED ORDER — ACETAMINOPHEN 325 MG PO TABS: 325.0000 mg | ORAL_TABLET | ORAL | Status: DC | PRN

## 2013-02-16 MED ORDER — ASPIRIN 81 MG PO CHEW
CHEWABLE_TABLET | ORAL | Status: AC
  Administered 2013-02-16: 81 mg
  Filled 2013-02-16: qty 1

## 2013-02-16 MED ORDER — CHLORHEXIDINE GLUCONATE 4 % EX LIQD
60.0000 mL | Freq: Once | CUTANEOUS | Status: AC
  Administered 2013-02-16: 4 via TOPICAL
  Filled 2013-02-16: qty 60

## 2013-02-16 MED ORDER — HEPARIN (PORCINE) IN NACL 2-0.9 UNIT/ML-% IJ SOLN
INTRAMUSCULAR | Status: AC
  Filled 2013-02-16: qty 500

## 2013-02-16 MED ORDER — SODIUM CHLORIDE 0.9 % IR SOLN
80.0000 mg | Status: DC
  Filled 2013-02-16: qty 2

## 2013-02-16 MED ORDER — SODIUM CHLORIDE 0.9 % IJ SOLN: 3.0000 mL | INTRAMUSCULAR | Status: DC | PRN

## 2013-02-16 MED ORDER — VANCOMYCIN HCL IN DEXTROSE 1-5 GM/200ML-% IV SOLN
1000.0000 mg | Freq: Two times a day (BID) | INTRAVENOUS | Status: AC
  Administered 2013-02-16: 1000 mg via INTRAVENOUS
  Filled 2013-02-16: qty 200

## 2013-02-16 MED ORDER — LIDOCAINE HCL (PF) 1 % IJ SOLN
INTRAMUSCULAR | Status: AC
  Filled 2013-02-16: qty 60

## 2013-02-16 MED ORDER — SODIUM CHLORIDE 0.9 % IV SOLN: 250.0000 mL | INTRAVENOUS | Status: DC | PRN

## 2013-02-16 MED ORDER — MIDAZOLAM HCL 5 MG/5ML IJ SOLN
INTRAMUSCULAR | Status: AC
  Filled 2013-02-16: qty 5

## 2013-02-16 NOTE — Op Note (Signed)
SURGEON: Hillis Range, MD  PREPROCEDURE DIAGNOSIS: Symptomatic Bradycardia  POSTPROCEDURE DIAGNOSIS: Symptomatic Bradycardia   PROCEDURES:  1. Left upper extremity venography.  2. Pacemaker implantation.   INTRODUCTION: Wendy Rose is a 67 y.o. female with a history of symptomatic bradycardia who presents today for pacemaker implantation. The patient reports intermittent episodes of dizziness and fatigue over the past few months. No reversible causes have been identified. The patient therefore presents today for pacemaker implantation.   DESCRIPTION OF PROCEDURE: Informed written consent was obtained, and the patient was brought to the electrophysiology lab in a fasting state. The patient received IV Versed and Fentanyl as sedation for the procedure today. The patients left chest was prepped and draped in the usual sterile fashion by the EP lab staff. The skin overlying the left deltopectoral region was infiltrated with lidocaine for local analgesia. A 4-cm incision was made over the left deltopectoral region. A left subcutaneous pacemaker pocket was fashioned using a combination of sharp and blunt dissection. Electrocautery was required to assure hemostasis.   RA/RV Lead Placement:  The left axillary vein was cannulated. No contrast was required for the procedure today. Through the left axillary vein, a Biotronic Selox JT-45 model [id number] number [id number]) right atrial lead and a Biotronic Selox ST-53 model [id number] (serial number [id number]) right ventricular lead were advanced with fluoroscopic visualization into the right atrial appendage and right ventricular apex positions respectively. Initial atrial lead P- waves measured 3  mV with impedance of 877 ohms and a threshold of 0.9 V at 0.5 msec. Right ventricular lead R-waves measured 13 mV with an impedance of 1189 ohms and a threshold of 0.4 V at 0.5 msec. Both leads were secured to the pectoralis fascia using #2-0 silk over the suture sleeves.    Device Placement:  The leads were then connected to a Biotronic Evia DR-T model [id number] (serial number [id number]) pacemaker. The pocket was irrigated with copious gentamicin solution. The pacemaker was then placed into the pocket. The pocket was then closed in 2 layers with 2.0 Vicryl suture for the subcutaneous and subcuticular layers. Steri- Strips and a sterile dressing were then applied. There were no early apparent complications.   CONCLUSIONS:  1. Successful implantation of a Biotronik Evia dual-chamber pacemaker for symptomatic bradycardia  2. No early apparent complications.

## 2013-02-16 NOTE — Progress Notes (Signed)
Subjective:  Slept well, no SOB  Objective:  Vital Signs in the last 24 hours: Temp:  [97.4 F (36.3 C)-98.1 F (36.7 C)] 97.4 F (36.3 C) (07/10 0350) Pulse Rate:  [38-56] 50 (07/10 0350) Resp:  [12-18] 14 (07/10 0350) BP: (89-140)/(23-82) 108/44 mmHg (07/10 0350) SpO2:  [96 %-100 %] 98 % (07/10 0350)  Intake/Output from previous day:  Intake/Output Summary (Last 24 hours) at 02/16/13 0757 Last data filed at 02/16/13 0700  Gross per 24 hour  Intake  420.4 ml  Output   1600 ml  Net -1179.6 ml    Physical Exam: General appearance: alert, cooperative and no distress No JVD Lungs: clear to auscultation bilaterally Heart: regular rate, slow rythhm Abd: soft, BS+ No significant edema   Rate: 35-50  Rhythm: sinus bradycardia and 1st AVB  Lab Results:  Recent Labs  02/14/13 1508  WBC 6.7  HGB 12.0  PLT 218    Recent Labs  02/14/13 1508 02/15/13 0420  NA 136 140  K 4.8 3.9  CL 99 107  CO2 28 24  GLUCOSE 92 106*  BUN 19 13  CREATININE 0.81 0.52    Recent Labs  02/15/13 2350 02/16/13 0408  TROPONINI <0.30 <0.30   Hepatic Function Panel  Recent Labs  02/14/13 1508  PROT 6.1  ALBUMIN 3.4*  AST 20  ALT 19  ALKPHOS 73  BILITOT 0.3   No results found for this basename: CHOL,  in the last 72 hours No results found for this basename: INR,  in the last 72 hours  Imaging: Imaging results have been reviewed  Cardiac Studies:  Assessment/Plan:   Principal Problem:   Near syncope Active Problems:   Bradycardia- symptomatic   Hypotension   Mitochondrial myopathy   Proximal muscle weakness   Hyperlipidemia   Depression   Fibromyalgia syndrome   History of mitral valve prolapse   GE reflux    PLAN: HR still bradycardic on Dopamine, TSH WNL, echo pending. I have made her NPO this am- proceed with pacemaker?Corine Shelter PA-C Beeper [phone] 02/16/2013, 7:57 AM   Echo Study Conclusions  - Left ventricle: The cavity size was normal.  Wall thickness was at the upper limits of normal. Systolic function was normal. The estimated ejection fraction was in the range of 55% to 60%. Wall motion was normal; there were no regional wall motion abnormalities. Left ventricular diastolic function parameters were normal. - Aortic valve: Mildly calcified annulus. Trileaflet. - Mitral valve: Mildly thickened leaflets . Mild regurgitation. - Right ventricle: Systolic pressure was increased. - Tricuspid valve: Mild regurgitation. - Pulmonary arteries: PA peak pressure: 40mm Hg (S).   Patient seen and examined. Agree with assessment and plan.Pt feels well but continues to have HR down to 35 -45 range. Echo reviewed. In light of continued marked bradycardia on dopamine will ask EP to see today for consideration of permanent pacemaker. ? If conduction abnormality related to mitochondrial myopathy with KSS varient.   Lennette Bihari, MD, [facility] 02/16/2013 8:48 AM

## 2013-02-16 NOTE — Consult Note (Signed)
ELECTROPHYSIOLOGY CONSULT NOTE  Patient ID: Wendy Rose MRN: [id number], DOB/AGE: 10/16/1945   Admit date: 02/14/2013 Date of Consult: 02/16/2013  Primary Physician: Margaree Mackintosh, MD Primary Cardiologist: Nicki Guadalajara, MD Reason for Consultation: Bradycardia  History of Present Illness Wendy Rose is a 67 y.o. female with no prior cardiac history who was admitted on 02/14/2013 with weakness. She has chronic weakness with "good and bad days" however, she noticed worsening from baseline accompanied by near syncope on the day of admission which prompted her to seek medical attention. She presented to [facility] ED where she was found to be bradycardic with rates in the 30s and hypotensive. She is on IV dopamine now. Her heart rate has not improved. She denies CP or SOB. She denies frank syncope. She has been diagnosed with a mitochondrial myopathy (followed at [facility]) and Dr. Tresa Endo questions whether or not her conduction abnormalities may be related to Kearns-Sayre variant. An echo has been done which revealed normal LV function. CEs negative. TSH is normal. EP has been asked to evaluate for PPM.  Past Medical History Past Medical History  Diagnosis Date  . Fibromyalgia   . MVP (mitral valve prolapse)   . Vertigo   . Anemia   . Anxiety   . heart arrhythmia   . Arthritis   . Chronic headaches   . Depression   . GERD (gastroesophageal reflux disease)   . Hyperlipemia   . IBS (irritable bowel syndrome)   . Pneumonia   . UTI (lower urinary tract infection)   . PONV (postoperative nausea and vomiting)     Past Surgical History Past Surgical History  Procedure Laterality Date  . Tonsillectomy and adenoidectomy    . Tympanoplasty    . Dilation and curettage of uterus    . Total abdominal hysterectomy    . Bladder suspension    . Cesarean section      Allergies/Intolerances Allergies  Allergen Reactions  . Cefuroxime Axetil     REACTION: Rash  . Codeine     REACTION: Reaction not known    . Naproxen     REACTION: Reaction not known  . Nitrofurantoin     REACTION: Increase LFT's  . Sulfonamide Derivatives     REACTION: Reaction not known  . Sulindac     REACTION: Reaction not known   Inpatient Medications . aspirin EC  81 mg Oral QHS  . B-complex with vitamin C  1 tablet Oral Daily  . buPROPion  150 mg Oral Daily  . calcium-vitamin D  1 tablet Oral Q breakfast  . FLUoxetine  40 mg Oral Daily  . pantoprazole  40 mg Oral BID   . DOPamine 3.007 mcg/kg/min (02/16/13 0900)   Family History Family History  Problem Relation Age of Onset  . Hypertension Mother   . Heart disease Father   . Diabetes Sister   . Heart disease Brother   . Colon cancer Neg Hx   . Esophageal cancer Neg Hx   . Stomach cancer Neg Hx   . Rectal cancer Neg Hx     Social History History   Social History  . Marital Status: Married    Spouse Name: N/A    Number of Children: N/A  . Years of Education: N/A   Occupational History  . Not on file.   Social History Main Topics  . Smoking status: Never Smoker   . Smokeless tobacco: Never Used  . Alcohol Use: No  . Drug Use: No  .  Sexually Active: Yes   Other Topics Concern  . Not on file   Social History Narrative  . No narrative on file    Review of Systems General: No chills, fever, night sweats or weight changes  Cardiovascular:  No chest pain, dyspnea on exertion, edema, orthopnea, palpitations, paroxysmal nocturnal dyspnea Dermatological: No rash, lesions or masses Respiratory: No cough, dyspnea Urologic: No hematuria, dysuria Abdominal: No nausea, vomiting, diarrhea, bright red blood per rectum, melena, or hematemesis Neurologic: No visual changes, weakness, changes in mental status All other systems reviewed and are otherwise negative except as noted above.  Physical Exam Vitals: Blood pressure 118/43, pulse 54, temperature 97.7 F (36.5 C), temperature source Oral, resp. rate 11, height 5\' 10"  (1.778 m), weight 164 lb  3.9 oz (74.5 kg), SpO2 98.00%.  General: Well developed, well appearing 67 y.o. female in no acute distress. HEENT: Normocephalic, atraumatic. EOMs intact. Sclera nonicteric. Oropharynx clear.  Neck: Supple without bruits. No JVD. Lungs: Respirations regular and unlabored, CTA bilaterally. No wheezes, rales or rhonchi. Heart: Bradycardic. S1, S2 present. No murmurs, rub, S3 or S4. Abdomen: Soft, non-tender, non-distended. BS present x 4 quadrants. No hepatosplenomegaly.  Extremities: No clubbing, cyanosis or edema. DP/PT/Radials 2+ and equal bilaterally. Psych: Normal affect. Neuro: Alert and oriented X 3. Moves all extremities spontaneously. Musculoskeletal: No kyphosis. Skin: Intact. Warm and dry. No rashes or petechiae in exposed areas.   Labs  Recent Labs  02/14/13 1508 02/15/13 1015 02/15/13 1601 02/15/13 2350 02/16/13 0408  CKTOTAL  --  64  --   --   --   CKMB  --  2.1  --   --   --   TROPONINI <0.30 <0.30 <0.30 <0.30 <0.30   Lab Results  Component Value Date   WBC 6.7 02/14/2013   HGB 12.0 02/14/2013   HCT 35.5* 02/14/2013   MCV 84.9 02/14/2013   PLT 218 02/14/2013    Recent Labs Lab 02/14/13 1508 02/15/13 0420  NA 136 140  K 4.8 3.9  CL 99 107  CO2 28 24  BUN 19 13  CREATININE 0.81 0.52  CALCIUM 8.7 8.7  PROT 6.1  --   BILITOT 0.3  --   ALKPHOS 73  --   ALT 19  --   AST 20  --   GLUCOSE 92 106*    Recent Labs  02/15/13 0420  TSH 0.632    Radiology/Studies Dg Chest Port 1 View  02/14/2013   *RADIOLOGY REPORT*  Clinical Data: Weakness and bradycardia  PORTABLE CHEST - 1 VIEW  Comparison: None  Findings: Heart size appears within normal limits.  No pleural effusion or edema.  No airspace consolidation.  Review of the visualized osseous structures is unremarkable.  IMPRESSION:  1.  No acute cardiopulmonary abnormalities.   Original Report Authenticated By: Signa Kell, M.D.    Echocardiogram  Study Conclusions - Left ventricle: The cavity size was normal.  Wall thickness was at the upper limits of normal. Systolic function was normal. The estimated ejection fraction was in the range of 55% to 60%. Wall motion was normal; there were no regional wall motion abnormalities. Left ventricular diastolic function parameters were normal. - Aortic valve: Mildly calcified annulus. Trileaflet. - Mitral valve: Mildly thickened leaflets . Mild regurgitation. - Right ventricle: Systolic pressure was increased. - Tricuspid valve: Mild regurgitation. - Pulmonary arteries: PA peak pressure: 40mm Hg (S).   12-lead ECG on admission shows sinus bradycardia with first degree AV block at 42 bpm Telemetry shows  persistent sinus bradycardia, occasional Wenckebach   Assessment and Plan 1. Symptomatic sinus bradycardia 2. Mitochondrial myopathy  Ms. Muma has persistent, symptomatic sinus bradycardia. Her heart rate has not improved and there are no reversible causes identified. Risks, benefits and alternatives to PPM implantation were discussed in detail with the patient today. These risks include, but are not limited to, bleeding, infection, pneumothorax, perforation, tamponade, vascular damage, renal failure, MI, stroke, death, and lead dislodgement. Ms. Gullick expressed verbal understanding and agrees to proceed.   Dr. Johney Frame to see Signed, Rick Duff, PA-C 02/16/2013, 10:54 AM  I have seen, examined the patient, and reviewed the above assessment and plan.  Changes to above are made where necessary.  The patient has symptomatic bradycardia.  No reversible causes have been found.  I would therefore recommend pacemaker implantation at this time.  Risks, benefits, alternatives to pacemaker implantation were discussed in detail with the patient today. The patient understands that the risks include but are not limited to bleeding, infection, pneumothorax, perforation, tamponade, vascular damage, renal failure, MI, stroke, death,  and lead dislodgement and wishes to  proceed. We will therefore schedule the procedure at the next available time.   Co Sign: Hillis Range, MD 02/16/2013 4:11 PM

## 2013-02-16 NOTE — Progress Notes (Signed)
Pt consented for permanent pacemaker, ? Procedure later today vs tomorrow, NPO for now, electrodes moved to posterior and hibiclens prep performed per order, Berle Mull RN

## 2013-02-16 NOTE — Progress Notes (Signed)
Pt complained of nausea and headache. Zofran 4mg  and Tylenol given. Continue to monitor.

## 2013-02-16 NOTE — Progress Notes (Signed)
Eyeglasses send with pt to cath lab. Cell phone send with her husband.

## 2013-02-16 NOTE — Progress Notes (Signed)
eLink Physician-Brief Progress Note Patient Name: Wendy Rose DOB: [birth year] MRN: [id number]  Date of Service  02/16/2013   HPI/Events of Note   D/w Cards, concern that urine is pathogen, will treat  eICU Interventions  Add cipro x 7 days, cards can decide if foley needs to be changes, will attempt to call RN. But pt in cathlab and only 3-6 wbc in last UA   Intervention Category Intermediate Interventions: Infection - evaluation and management  FEINSTEIN,DANIEL J. 02/16/2013, 4:54 PM

## 2013-02-16 NOTE — Progress Notes (Signed)
Pt transferred to Cath Lab for pacemaker. Consent was done. NPO since 7AM. Report given to the receiving RN. Continue to monitor.

## 2013-02-17 ENCOUNTER — Inpatient Hospital Stay (HOSPITAL_COMMUNITY): Payer: Medicare Other

## 2013-02-17 DIAGNOSIS — N390 Urinary tract infection, site not specified: Secondary | ICD-10-CM | POA: Diagnosis present

## 2013-02-17 LAB — BASIC METABOLIC PANEL
BUN: 14 mg/dL (ref 6–23)
Calcium: 8.3 mg/dL — ABNORMAL LOW (ref 8.4–10.5)
Chloride: 103 mEq/L (ref 96–112)
Creatinine, Ser: 0.53 mg/dL (ref 0.50–1.10)
GFR calc Af Amer: >90 mL/min (ref >90)

## 2013-02-17 LAB — ALDOLASE: Aldolase: 13.8 U/L — ABNORMAL HIGH (ref <=8.1)

## 2013-02-17 MED ORDER — LEVOFLOXACIN 500 MG PO TABS
500.0000 mg | ORAL_TABLET | Freq: Every day | ORAL | Status: DC
  Administered 2013-02-17: 500 mg via ORAL
  Filled 2013-02-17: qty 1

## 2013-02-17 MED ORDER — LEVOFLOXACIN 500 MG PO TABS: 500.0000 mg | ORAL_TABLET | Freq: Every day | ORAL | Status: DC

## 2013-02-17 NOTE — Progress Notes (Signed)
No distress from po Levaquin. Discharge instructions along with med list and temporary PPM card/info provided. IVs d/c'd with catheter intact. Awaiting transport to main lobby for d/c home.Mamie Levers

## 2013-02-17 NOTE — Progress Notes (Signed)
Subjective: Up in chair no complaints  Objective: Vital signs in last 24 hours: Temp:  [97.3 F (36.3 C)-98 F (36.7 C)] 97.3 F (36.3 C) (07/11 0446) Pulse Rate:  [51-82] 82 (07/11 0446) Resp:  [11-18] 18 (07/11 0446) BP: (100-159)/(43-136) 100/55 mmHg (07/11 0446) SpO2:  [97 %-100 %] 97 % (07/11 0446) Weight change:    Intake/Output from previous day: -800 07/10 0701 - 07/11 0700 In: 95.3 [I.V.:95.3] Out: 1200 [Urine:1200] Intake/Output this shift:    PE: General:Pleasant affect, NAD Lt chest with dressing dry and intact.  Heart:S1S2 RRR without murmur, gallup, rub or click Lungs:clear without rales, rhonchi, or wheezes DGU:YQIH, non tender, + BS, do not palpate liver spleen or masses Ext:no lower ext edema, 2+ pedal pulses, 2+ radial pulses Neuro:alert and oriented, MAE, follows commands, + facial symmetry   Lab Results:  Recent Labs  02/14/13 1508  WBC 6.7  HGB 12.0  HCT 35.5*  PLT 218   BMET  Recent Labs  02/14/13 1508 02/15/13 0420  NA 136 140  K 4.8 3.9  CL 99 107  CO2 28 24  GLUCOSE 92 106*  BUN 19 13  CREATININE 0.81 0.52  CALCIUM 8.7 8.7    Recent Labs  02/16/13 0408 02/16/13 1505  TROPONINI <0.30 <0.30    Lab Results  Component Value Date   CHOL 275* 07/13/2012   HDL 61 07/13/2012   LDLCALC 194* 07/13/2012   TRIG 100 07/13/2012   CHOLHDL 4.5 07/13/2012   Lab Results  Component Value Date   HGBA1C 5.8* 08/29/2012     Lab Results  Component Value Date   TSH 0.632 02/15/2013    Hepatic Function Panel  Recent Labs  02/14/13 1508  PROT 6.1  ALBUMIN 3.4*  AST 20  ALT 19  ALKPHOS 73  BILITOT 0.3     Studies/Results: Dg Chest 2 View  02/17/2013   *RADIOLOGY REPORT*  Clinical Data:  Post pacemaker insertion  CHEST - 2 VIEW  Comparison: 02/14/2013  Findings: New left subclavian transvenous sequential pacemaker with leads projecting at right atrium and right ventricle. Normal heart size, mediastinal contours, and  pulmonary vascularity. COPD changes with minimal right apical scarring. A short thin line is identified immediately superior to the pacemaker, suspect clothing artifact. No acute infiltrate, pleural effusion or definite pneumothorax. Osseous structures unremarkable.  IMPRESSION: No pneumothorax following pacemaker placement. COPD changes.   Original Report Authenticated By: Ulyses Southward, M.D.    Medications: I have reviewed the patient's current medications. Scheduled Meds: . aspirin      . aspirin EC  81 mg Oral QHS  . B-complex with vitamin C  1 tablet Oral Daily  . buPROPion  150 mg Oral Daily  . calcium-vitamin D  1 tablet Oral Q breakfast  . ciprofloxacin  400 mg Intravenous Q12H  . FLUoxetine  40 mg Oral Daily  . pantoprazole  40 mg Oral BID  . sodium chloride  3 mL Intravenous Q12H   Continuous Infusions:  PRN Meds:.sodium chloride, acetaminophen, ALPRAZolam, HYDROcodone-acetaminophen, ondansetron (ZOFRAN) IV, sodium chloride, traMADol  Assessment/Plan: Principal Problem:   Near syncope Active Problems:   Hyperlipidemia   Depression   Fibromyalgia syndrome   History of mitral valve prolapse   GE reflux   Mitochondrial myopathy   Proximal muscle weakness   Bradycardia- symptomatic   Hypotension   UTI (urinary tract infection)  PLAN: CXR without pneumo post new PPM.  UTI, continue cipro Ambulate if stable ok for discharge.  LOS: 3 days    [facility]  Nurse Practitioner Certified Pager [phone] 02/17/2013, 8:33 AM  Agree with note written by Nada Boozer RNP  S/P PTVPM for symptomatic bradycardia. Exam benign. CXR ok. For D/C today. ROV with Dr. Royann Shivers.  Runell Gess 02/17/2013 8:43 AM

## 2013-02-17 NOTE — Discharge Summary (Signed)
Physician Discharge Summary      Patient ID: Wendy Rose MRN: [id number] DOB/AGE: 68/19/1947 68 y.o.  Admit date: 02/14/2013 Discharge date: 02/17/2013 Discharge Diagnoses:  Principal Problem:   Near syncope Active Problems:   Bradycardia- symptomatic   Hyperlipidemia   Depression   Fibromyalgia syndrome   History of mitral valve prolapse   GE reflux   Mitochondrial myopathy   Proximal muscle weakness   Hypotension   UTI (urinary tract infection)   S/P cardiac pacemaker procedure, 02/16/13, Bio tronik device   Discharged Condition: good  Procedures: 02/16/13 pacemaker implantation of a Biotronik Evia dual-chamber pacemaker for symptomatic bradycardia by Dr. Gertie Baron Course: 67 y/o female with no prior serious cardiology problems though she related she has seen several different cardiologists and had stress tests in the past- all normal. She has never had a coronary angiogram. She was told in the past she had MVP. She does have medical issues including mitochondrial disorder, fibromyalgia, IBS, depression and anxiety. She has chronic weakness, "good days and bad days". 02/14/13 she was getting ready to leave the house with her husband and felt weak. She continued to get ready and had a near syncopal spell. She was admitted to [facility] ER she was found to be bradycardic with HR is the 30s and hypotensive. She was tolerating this well in ER.  She denied angina or dyspnea. She had prescribed Metoprolol in the past for MVP and palpitations but has not taken this recently.  She was seen by Dr. Tresa Endo and admitted to [facility] and dopamine was started to maintain HR.  She was hypotensive and IV fluids given as well.  By next am still weak and tired with improved BP but HR in the 40s.  HR continued to be low.  EP consult obtained. TSH normal, normal echo.  No underlying cause for symptomatic bradycardia found.  Dr. Johney Frame recommended PPM.  Pt agreed and she underwent implantation of dual chamber  PPM.   She was noted to have a UTI, per CCM recommendations she was started on Levaquin.    By next AM she was stable and ready for discharge.  Pacer site without complications.  CXR without pneumothorax.   Consults: cardiology  Significant Diagnostic Studies:  BMET    Component Value Date/Time   NA 133* 02/17/2013 0515   K 4.6 02/17/2013 0515   CL 103 02/17/2013 0515   CO2 15* 02/17/2013 0515   GLUCOSE 86 02/17/2013 0515   BUN 14 02/17/2013 0515   CREATININE 0.53 02/17/2013 0515   CREATININE 0.67 07/13/2012 1005   CALCIUM 8.3* 02/17/2013 0515   GFRNONAA >90 02/17/2013 0515   GFRAA >90 02/17/2013 0515    CBC    Component Value Date/Time   WBC 6.7 02/14/2013 1508   RBC 4.18 02/14/2013 1508   HGB 12.0 02/14/2013 1508   HCT 35.5* 02/14/2013 1508   PLT 218 02/14/2013 1508   MCV 84.9 02/14/2013 1508   MCH 28.7 02/14/2013 1508   MCHC 33.8 02/14/2013 1508   RDW 12.9 02/14/2013 1508   LYMPHSABS 2.6 02/14/2013 1508   MONOABS 0.7 02/14/2013 1508   EOSABS 0.1 02/14/2013 1508   BASOSABS 0.0 02/14/2013 1508  TSH  0.632 Troponin Is negative. And negative CK Urine culture with KLEBSIELLA   Aldolase 13.8  2D Echo: Study Conclusions  - Left ventricle: The cavity size was normal. Wall thickness was at the upper limits of normal. Systolic function was normal. The estimated ejection fraction was in  the range of 55% to 60%. Wall motion was normal; there were no regional wall motion abnormalities. Left ventricular diastolic function parameters were normal. - Aortic valve: Mildly calcified annulus. Trileaflet. - Mitral valve: Mildly thickened leaflets . Mild regurgitation. - Right ventricle: Systolic pressure was increased. - Tricuspid valve: Mild regurgitation. - Pulmonary arteries: PA peak pressure: 40mm Hg (S). Impressions:  - The right ventricular systolic pressure was increased consistent with mild pulmonary hypertension  CXR: CHEST - 2 VIEW  Comparison: 02/14/2013  Findings: New left subclavian  transvenous sequential pacemaker with leads projecting at right atrium and right ventricle. Normal heart size, mediastinal contours, and pulmonary vascularity. COPD changes with minimal right apical scarring. A short thin line is identified immediately superior to the pacemaker, suspect clothing artifact. No acute infiltrate, pleural effusion or definite pneumothorax. Osseous structures unremarkable.  IMPRESSION: No pneumothorax following pacemaker placement. COPD changes.      Discharge Exam: Blood pressure 92/58, pulse 83, temperature 97.3 F (36.3 C), temperature source Oral, resp. rate 18, height 5\' 10"  (1.778 m), weight 164 lb 3.9 oz (74.5 kg), SpO2 97.00%. AM exam:  Physical Exam:  Vitals: BP 100/55  Pulse 82  Temp(Src) 97.3 F (36.3 C) (Oral)  Resp 18  Ht 5\' 10"  (1.778 m)  Wt 164 lb 3.9 oz (74.5 kg)  BMI 23.57 kg/m2  SpO2 97%  General: Well developed, well appearing 67 year old female in no acute distress.  Neck: Supple. JVD not elevated.  Lungs: Clear bilaterally to auscultation without wheezes, rales, or rhonchi. Breathing is unlabored.  Heart: RRR S1 S2 without murmurs, rubs, or gallops.  Abdomen: Soft, non-distended.  Extremities: No clubbing or cyanosis. No edema. Distal pedal pulses are 2+ and equal bilaterally.  Neuro: Alert and oriented X 3. Moves all extremities spontaneously. No focal deficits.  Skin: Left upper chest/implant site intact without bleeding or hematoma  Disposition: 01-Home or Self Care       Future Appointments Provider Department Dept Phone   02/27/2013 4:00 PM [facility] [employer] Main Office [place]) [phone]   02/28/2013 9:00 AM Margaree Mackintosh, MD [place]let [place], MD [phone]   03/02/2013 9:45 AM Margaree Mackintosh, MD Sharlet Sal[place], MD [phone]   03/14/2013 10:45 AM Lennette Bihari, MD [facility] [place] [phone]       Medication List         ALPRAZolam 1 MG tablet    Commonly known as:  XANAX  Take 1 tablet (1 mg total) by mouth at bedtime as needed.     aspirin EC 81 MG tablet  Take 81 mg by mouth at bedtime.     B-complex with vitamin C tablet  Take 1 tablet by mouth daily.     buPROPion 150 MG 24 hr tablet  Commonly known as:  WELLBUTRIN XL  take 1 tablet by mouth every morning     CALCIUM 500+D 500-200 MG-UNIT per tablet  Generic drug:  calcium-vitamin D  Take 1 tablet by mouth daily with breakfast.     co-enzyme Q-10 50 MG capsule  Take 50 mg by mouth 2 (two) times daily.     Creatinine Powd  Take by mouth.     FLUoxetine 40 MG capsule  Commonly known as:  PROZAC  Take 1 capsule (40 mg total) by mouth daily.     HYDROcodone-acetaminophen 10-650 MG per tablet  Commonly known as:  LORCET  take 1 tablet by mouth every 8 hours if needed  for pain     HYDROcodone-acetaminophen 10-325 MG per tablet  Commonly known as:  NORCO  Take 1 tablet by mouth every 8 (eight) hours as needed for pain.     levofloxacin 500 MG tablet  Commonly known as:  LEVAQUIN  Take 1 tablet (500 mg total) by mouth daily.     MAG-200 PO  Take by mouth.     pantoprazole 40 MG tablet  Commonly known as:  PROTONIX  Take 1 tablet (40 mg total) by mouth 2 (two) times daily.     THIAMINE PO  Take by mouth.     TRAMADOL HCL PO  Take by mouth. 50MG        Follow-up Information   Follow up with [facility] On 02/27/2013. (At 4:00 PM for wound check)    Contact information:   [address] [place] [address] [phone]      Follow up with Hillis Range, MD In 3 months. (Our office will schedule)    Contact information:   [address] [place] [address] [phone]      Follow up with Lennette Bihari, MD On 03/14/2013. (at 10:45 am)    Contact information:   [address] [place] [address] [phone]     Discharge Instructions: Post pacemaker instructions  given.   Signed: Leone Brand Nurse Practitioner-Certified [facility] 02/19/2013, 4:20 PM  Time spent on discharge :40 minutes.

## 2013-02-17 NOTE — Progress Notes (Signed)
     Patient: Wendy Rose Date of Encounter: 02/17/2013, 7:52 AM Admit date: 02/14/2013     Subjective  Wendy Rose has no complaints this AM. She denies CP or SOB.   Objective  Physical Exam: Vitals: BP 100/55  Pulse 82  Temp(Src) 97.3 F (36.3 C) (Oral)  Resp 18  Ht 5\' 10"  (1.778 m)  Wt 164 lb 3.9 oz (74.5 kg)  BMI 23.57 kg/m2  SpO2 97% General: Well developed, well appearing 67 year old female in no acute distress. Neck: Supple. JVD not elevated. Lungs: Clear bilaterally to auscultation without wheezes, rales, or rhonchi. Breathing is unlabored. Heart: RRR S1 S2 without murmurs, rubs, or gallops.  Abdomen: Soft, non-distended. Extremities: No clubbing or cyanosis. No edema.  Distal pedal pulses are 2+ and equal bilaterally. Neuro: Alert and oriented X 3. Moves all extremities spontaneously. No focal deficits. Skin: Left upper chest/implant site intact without bleeding or hematoma.  Intake/Output:  Intake/Output Summary (Last 24 hours) at 02/17/13 0752 Last data filed at 02/17/13 0600  Gross per 24 hour  Intake  95.27 ml  Output    900 ml  Net -804.73 ml    Inpatient Medications:  . aspirin      . aspirin EC  81 mg Oral QHS  . B-complex with vitamin C  1 tablet Oral Daily  . buPROPion  150 mg Oral Daily  . calcium-vitamin D  1 tablet Oral Q breakfast  . ciprofloxacin  400 mg Intravenous Q12H  . FLUoxetine  40 mg Oral Daily  . pantoprazole  40 mg Oral BID  . sodium chloride  3 mL Intravenous Q12H    Labs:  Recent Labs  02/14/13 1508 02/15/13 0420  NA 136 140  K 4.8 3.9  CL 99 107  CO2 28 24  GLUCOSE 92 106*  BUN 19 13  CREATININE 0.81 0.52  CALCIUM 8.7 8.7    Recent Labs  02/14/13 1508  AST 20  ALT 19  ALKPHOS 73  BILITOT 0.3  PROT 6.1  ALBUMIN 3.4*    Recent Labs  02/14/13 1508  WBC 6.7  NEUTROABS 3.2  HGB 12.0  HCT 35.5*  MCV 84.9  PLT 218    Recent Labs  02/14/13 1508 02/15/13 1015 02/15/13 1601 02/15/13 2350 02/16/13 0408  02/16/13 1505  CKTOTAL  --  64  --   --   --   --   CKMB  --  2.1  --   --   --   --   TROPONINI <0.30 <0.30 <0.30 <0.30 <0.30 <0.30    Recent Labs  02/15/13 0420  TSH 0.632    Radiology/Studies:  CXR: official report pending but reviewed by Dr. Johney Frame and shows stable lead placement, no PTX Device interrogation: performed by industry; stable lead measurements; shortened AV delays otherwise no changes made    Assessment and Plan  1. Symptomatic bradycardia s/p PPM implant yesterday  Wendy Rose is doing well post PPM implant yesterday. Her wound is intact without bleeding or hematoma. Her CXR shows stable lead placement without PTX. Her device interrogation shows normal PPM function. Reviewed wound care and activity restrictions with her. OK to DC later this AM. Wound check in 10 days. Follow-up with Dr. Johney Frame in 3 months.   Signed, EDMISTEN, BROOKE PA-C  Device interrogation reviewed today and is normal.  CXR reveals stable leads.  Hillis Range, MD

## 2013-02-19 ENCOUNTER — Encounter (HOSPITAL_COMMUNITY): Payer: Self-pay | Admitting: Cardiology

## 2013-02-19 DIAGNOSIS — Z950 Presence of cardiac pacemaker: Secondary | ICD-10-CM

## 2013-02-19 HISTORY — DX: Presence of cardiac pacemaker: Z95.0

## 2013-02-27 ENCOUNTER — Ambulatory Visit (INDEPENDENT_AMBULATORY_CARE_PROVIDER_SITE_OTHER): Payer: Medicare Other | Admitting: *Deleted

## 2013-02-27 DIAGNOSIS — I498 Other specified cardiac arrhythmias: Secondary | ICD-10-CM

## 2013-02-27 DIAGNOSIS — R001 Bradycardia, unspecified: Secondary | ICD-10-CM

## 2013-02-27 DIAGNOSIS — Z950 Presence of cardiac pacemaker: Secondary | ICD-10-CM

## 2013-02-27 NOTE — Progress Notes (Signed)
Pt seen in device clinic for follow up of recently implanted pacemaker.  Wound well healed.  No redness, swelling, or edema.  Steri-strips removed today.   Interrogation by industry---Atrial threshold inc since implant from 1.0V to 1.9V, inc'd Atrial output from 3.0V to 3.8V. See PaceArt for full details.  Pt denies chest pain, shortness of breath, palpitations, or dizziness.  Pt to follow up with Dr. Johney Frame in 3 months.   Weston Anna 02/27/2013 6:57 PM

## 2013-02-28 ENCOUNTER — Other Ambulatory Visit: Payer: Medicare Other | Admitting: Internal Medicine

## 2013-03-02 ENCOUNTER — Ambulatory Visit: Payer: Medicare Other | Admitting: Internal Medicine

## 2013-03-02 LAB — PACEMAKER DEVICE OBSERVATION
AL IMPEDENCE PM: 721 Ohm
AL THRESHOLD: 1.9 V
RV LEAD IMPEDENCE PM: 838 Ohm

## 2013-03-08 ENCOUNTER — Other Ambulatory Visit: Payer: Self-pay | Admitting: Internal Medicine

## 2013-03-14 ENCOUNTER — Ambulatory Visit (INDEPENDENT_AMBULATORY_CARE_PROVIDER_SITE_OTHER): Payer: Medicare Other | Admitting: Cardiovascular Disease

## 2013-03-14 ENCOUNTER — Encounter: Payer: Self-pay | Admitting: Cardiovascular Disease

## 2013-03-14 VITALS — BP 122/84 | HR 75 | Ht 71.0 in | Wt 163.3 lb

## 2013-03-14 DIAGNOSIS — Z8679 Personal history of other diseases of the circulatory system: Secondary | ICD-10-CM

## 2013-03-14 DIAGNOSIS — R0602 Shortness of breath: Secondary | ICD-10-CM

## 2013-03-14 DIAGNOSIS — Z8669 Personal history of other diseases of the nervous system and sense organs: Secondary | ICD-10-CM

## 2013-03-14 DIAGNOSIS — G7289 Other specified myopathies: Secondary | ICD-10-CM

## 2013-03-14 DIAGNOSIS — E782 Mixed hyperlipidemia: Secondary | ICD-10-CM

## 2013-03-14 DIAGNOSIS — G713 Mitochondrial myopathy, not elsewhere classified: Secondary | ICD-10-CM

## 2013-03-14 NOTE — Patient Instructions (Addendum)
Your physician has requested that you have a lexiscan myoview. For further information please visit [email or web address] Please follow instruction sheet, as given.  This will be scheduled in the next 2-3 weeks. Your physician recommends that you schedule a follow-up appointment in: 6 WEEKS.

## 2013-03-17 ENCOUNTER — Encounter: Payer: Self-pay | Admitting: Internal Medicine

## 2013-03-21 LAB — NMR LIPOPROFILE WITH LIPIDS
Cholesterol, Total: 233 mg/dL — ABNORMAL HIGH (ref <200)
HDL Particle Number: 38.6 umol/L (ref >=30.5)
HDL-C: 64 mg/dL (ref >=40)
LDL (calc): 153 mg/dL — ABNORMAL HIGH (ref <100)
LP-IR Score: <25 (ref <=45)
Small LDL Particle Number: 627 nmol/L — ABNORMAL HIGH (ref <=527)

## 2013-03-23 ENCOUNTER — Ambulatory Visit (HOSPITAL_COMMUNITY)
Admission: RE | Admit: 2013-03-23 | Discharge: 2013-03-23 | Disposition: A | Discharge status: Home / Self Care | Payer: Medicare Other | Source: Ambulatory Visit | Attending: Cardiovascular Disease | Admitting: Cardiovascular Disease

## 2013-03-23 ENCOUNTER — Encounter: Payer: Self-pay | Admitting: Cardiovascular Disease

## 2013-03-23 DIAGNOSIS — E782 Mixed hyperlipidemia: Secondary | ICD-10-CM

## 2013-03-23 DIAGNOSIS — R0609 Other forms of dyspnea: Secondary | ICD-10-CM | POA: Insufficient documentation

## 2013-03-23 DIAGNOSIS — Z8249 Family history of ischemic heart disease and other diseases of the circulatory system: Secondary | ICD-10-CM | POA: Insufficient documentation

## 2013-03-23 DIAGNOSIS — Z950 Presence of cardiac pacemaker: Secondary | ICD-10-CM | POA: Insufficient documentation

## 2013-03-23 DIAGNOSIS — R5381 Other malaise: Secondary | ICD-10-CM | POA: Insufficient documentation

## 2013-03-23 DIAGNOSIS — R0602 Shortness of breath: Secondary | ICD-10-CM

## 2013-03-23 MED ORDER — TECHNETIUM TC 99M SESTAMIBI GENERIC - CARDIOLITE
30.0000 | Freq: Once | INTRAVENOUS | Status: AC | PRN
  Administered 2013-03-23: 30 via INTRAVENOUS

## 2013-03-23 MED ORDER — TECHNETIUM TC 99M SESTAMIBI GENERIC - CARDIOLITE
10.0000 | Freq: Once | INTRAVENOUS | Status: AC | PRN
  Administered 2013-03-23: 10 via INTRAVENOUS

## 2013-03-23 MED ORDER — REGADENOSON 0.4 MG/5ML IV SOLN
0.4000 mg | Freq: Once | INTRAVENOUS | Status: AC
  Administered 2013-03-23: 0.4 mg via INTRAVENOUS

## 2013-03-23 NOTE — Progress Notes (Signed)
Patient ID: Wendy Rose, female   DOB: 23-Oct-1945, 67 y.o.   MRN: [id number]     HPI: Wendy Rose, is a 67 y.o. female and in followup of her recent  hospitalization.  Wendy Rose is a 67 year old female who has been diagnosed with mitochondrial myopathy. She also has been told in the past of having mitral valve prolapse. She has episodes of chronic weakness on 02/14/2013 she developed profound presyncopal spell and also did have some chest tightness. Hypotensive and bradycardic with heart rates in the 30s. She was seen initially by me [facility] emergency room and was transported to [facility]. She was started on dopamine. When I had seen her in the hospital questioned whether or not her conduction abnormalities were related to the possibility of Kearns-Sayre syndrome variant of mitochondrial myopathy. She has had some difficulty with vision but denied any episodes of ptosis. An echo Doppler study showed an ejection fraction of 55-60% without wall motion abnormalities. She did have a mildly calcified aortic annulus. She had mild MR. There was mild TR, and mild pulmonary hypertension with estimated RV systolic pressure at 40 mm. She ultimately required insertion of a permanent pacemaker which was done by Dr. Johney Frame on 02/16/2013 and  a Biotronik  pacemaker was inserted. She has felt improved with reference to any further episodes of presyncope. This female states that essentially all of her family members died with heart disease before the age 53. Most had had cardiac events in their 74s. Has noticed shortness of breath with activity. She does note weakness. She does note vague chest sensation. She presents for followup evaluation. She has been followed by Dr. Eden Emms Baxley  who has monitored her lipids; she is not on lipid-lowering therapy.  Past Medical History  Diagnosis Date  . Fibromyalgia   . MVP (mitral valve prolapse)   . Vertigo   . Anemia   . Anxiety   . Symptomatic  bradycardia     s/p Biotronik Evia dual chamber pacemaker implant 02/16/2013 by Dr Johney Frame  . Arthritis   . Chronic headaches   . Depression   . GERD (gastroesophageal reflux disease)   . Hyperlipemia   . IBS (irritable bowel syndrome)   . Pneumonia   . UTI (lower urinary tract infection)   . PONV (postoperative nausea and vomiting)   . S/P cardiac pacemaker procedure, 02/16/13, Bio tronik device 02/19/2013    Past Surgical History  Procedure Laterality Date  . Tonsillectomy and adenoidectomy    . Tympanoplasty    . Dilation and curettage of uterus    . Total abdominal hysterectomy    . Bladder suspension    . Cesarean section    . Pacemaker insertion  02/16/2013    Biotronik Evia dual chamber pacemaker implanted by Dr Johney Frame    Allergies  Allergen Reactions  . Contrast Media [Iodinated Diagnostic Agents] Shortness Of Breath    "difficulty breathing"  . Ciprofloxacin Itching    Pt stated   . Cefuroxime Axetil     REACTION: Rash  . Codeine     REACTION: Reaction not known  . Naproxen     REACTION: Reaction not known  . Nitrofurantoin     REACTION: Increase LFT's  . Sulfonamide Derivatives     REACTION: Reaction not known  . Sulindac     REACTION: Reaction not known    Current Outpatient Prescriptions  Medication Sig Dispense Refill  . ALPRAZolam (XANAX) 1 MG tablet Take 1  tablet (1 mg total) by mouth at bedtime as needed.  60 tablet  2  . aspirin EC 81 MG tablet Take 81 mg by mouth at bedtime.      . B Complex-C (B-COMPLEX WITH VITAMIN C) tablet Take 1 tablet by mouth daily.      Marland Kitchen buPROPion (WELLBUTRIN XL) 150 MG 24 hr tablet take 1 tablet by mouth every morning  30 tablet  11  . calcium-vitamin D (CALCIUM 500+D) 500-200 MG-UNIT per tablet Take 1 tablet by mouth daily with breakfast.      . co-enzyme Q-10 50 MG capsule Take 50 mg by mouth 2 (two) times daily.      . Creatinine POWD Take by mouth.      Marland Kitchen FLUoxetine (PROZAC) 40 MG capsule take 1 capsule by mouth once  daily  30 capsule  PRN  . HYDROcodone-acetaminophen (LORCET) 10-650 MG per tablet take 1 tablet by mouth every 8 hours if needed for pain  90 tablet  3  . Magnesium Oxide (MAG-200 PO) Take by mouth.        . pantoprazole (PROTONIX) 40 MG tablet Take 1 tablet (40 mg total) by mouth 2 (two) times daily.  60 tablet  6  . Thiamine HCl (THIAMINE PO) Take by mouth.       . TRAMADOL HCL PO Take by mouth 2 (two) times daily. 50MG        No current facility-administered medications for this visit.    History   Social History  . Marital Status: Married    Spouse Name: N/A    Number of Children: N/A  . Years of Education: N/A   Occupational History  . Not on file.   Social History Main Topics  . Smoking status: Never Smoker   . Smokeless tobacco: Never Used  . Alcohol Use: No  . Drug Use: No  . Sexual Activity: Yes   Other Topics Concern  . Not on file   Social History Narrative  . No narrative on file    ROS is negative for fevers, chills or night sweats. She does note weakness. History of fibromyalgia as well as anxiety. She notes shortness of breath. Mild visual symptoms but without ptosis. She denies bleeding. She denies significant edema she denies tremors. Time she does have some visual changes.  Other system review is negative.  PE BP 122/84  Pulse 75  Ht 5\' 11"  (1.803 m)  Wt 163 lb 4.8 oz (74.072 kg)  BMI 22.79 kg/m2  General: Alert, oriented, no distress.  Skin: normal turgor, no rashes HEENT: Normocephalic, atraumatic. Pupils round and reactive; sclera anicteric;no lid lag.  Nose without nasal septal hypertrophy Mouth/Parynx benign; Mallinpatti scale 2 Neck: No JVD, no carotid briuts Lungs: clear to ausculatation and percussion; no wheezing or rales Heart: RRR, s1 s2 normal 1/6 sem Abdomen: soft, nontender; no hepatosplenomehaly, BS+; abdominal aorta nontender and not dilated by palpation. Pulses 2+ Extremities: no clubbing cyanosis or edema, Homan's sign negative    Neurologic: grossly nonfocal  ECG: AV paced rhythm  LABS:  BMET    Component Value Date/Time   NA 133* 02/17/2013 0515   K 4.6 02/17/2013 0515   CL 103 02/17/2013 0515   CO2 15* 02/17/2013 0515   GLUCOSE 86 02/17/2013 0515   BUN 14 02/17/2013 0515   CREATININE 0.53 02/17/2013 0515   CREATININE 0.67 07/13/2012 1005   CALCIUM 8.3* 02/17/2013 0515   GFRNONAA >90 02/17/2013 0515   GFRAA >90 02/17/2013 0515  Hepatic Function Panel     Component Value Date/Time   PROT 6.1 02/14/2013 1508   ALBUMIN 3.4* 02/14/2013 1508   AST 20 02/14/2013 1508   ALT 19 02/14/2013 1508   ALKPHOS 73 02/14/2013 1508   BILITOT 0.3 02/14/2013 1508     CBC    Component Value Date/Time   WBC 6.7 02/14/2013 1508   RBC 4.18 02/14/2013 1508   HGB 12.0 02/14/2013 1508   HCT 35.5* 02/14/2013 1508   PLT 218 02/14/2013 1508   MCV 84.9 02/14/2013 1508   MCH 28.7 02/14/2013 1508   MCHC 33.8 02/14/2013 1508   RDW 12.9 02/14/2013 1508   LYMPHSABS 2.6 02/14/2013 1508   MONOABS 0.7 02/14/2013 1508   EOSABS 0.1 02/14/2013 1508   BASOSABS 0.0 02/14/2013 1508     BNP No results found for this basename: probnp    Lipid Panel     Component Value Date/Time   CHOL 275* 07/13/2012 1005   TRIG 82 03/20/2013 1019   TRIG 100 07/13/2012 1005   HDL 61 07/13/2012 1005   CHOLHDL 4.5 07/13/2012 1005   VLDL 20 07/13/2012 1005   LDLCALC 153* 03/20/2013 1019   LDLCALC 194* 07/13/2012 1005     RADIOLOGY: No results found.    ASSESSMENT AND PLAN: Ms. Voight is a 67 year old female with a diagnosis of mitochondrial myopathy who is one month status post insertion of a pacemaker for bradycardia induced presyncope. She does have a history of hyperlipidemia, currently untreated. She also has very strong family history for coronary artery disease with essentially all family members having heart disease before age 6. She has experienced episodes of shortness of breath with activity and has noticed some vague chest sensations. I am recommending that she be  referred for a nuclear perfusion scan to assess for potential ischemia in light of her symptoms and strong family history for premature coronary artery disease. I also am recommending she undergo followup laboratory with an NMR lipoprofile  and if elevated initiate therapy to reduce potential future cardiovascular risk. I will see her back in the office in followup of the above studies and further recommendations will be made at that time.     Lennette Bihari, MD, [facility]  03/23/2013 12:51 PM

## 2013-03-23 NOTE — Procedures (Addendum)
[facility] [address] [place] [address] [phone]  Cardiology Nuclear Med Study  Wendy Rose is a 67 y.o. female     MRN : [id number]     DOB: 1946-07-27  Procedure Date: 03/23/2013  Nuclear Med Background Indication for Stress Test:  Evaluation for Ischemia and Post Hospital History:  Pacer Cardiac Risk Factors: Family History - CAD and Lipids  Symptoms:  Fatigue   Nuclear Pre-Procedure Caffeine/Decaff Intake:  1:00am NPO After: 11:00am   IV Site: R Antecubital  IV 0.9% NS with Angio Cath:  22g  Chest Size (in):  n/a IV Started by: Koren Shiver, CNMT  Height: 5\' 10"  (1.778 m)  Cup Size: 36D  BMI:  Body mass index is 23.53 kg/(m^2). Weight:  164 lb (74.39 kg)   Tech Comments:  n/a    Nuclear Med Study 1 or 2 day study: 1 day  Stress Test Type:  Lexiscan  Order Authorizing Provider:  Nicki Guadalajara, MD   Resting Radionuclide: Technetium 49m Sestamibi  Resting Radionuclide Dose: 10.4 mCi   Stress Radionuclide:  Technetium 20m Sestamibi  Stress Radionuclide Dose: 30.2 mCi           Stress Protocol Rest HR: 67 Stress HR:83  Rest BP:149/83 Stress BP: 152/79  Exercise Time (min): n/a METS: n/a          Dose of Adenosine (mg):  n/a Dose of Lexiscan: 0.4 mg  Dose of Atropine (mg): n/a Dose of Dobutamine: n/a mcg/kg/min (at max HR)  Stress Test Technologist: Ernestene Mention, CCT Nuclear Technologist: Koren Shiver, CNMT   Rest Procedure:  Myocardial perfusion imaging was performed at rest 45 minutes following the intravenous administration of Technetium 63m Sestamibi. Stress Procedure:  The patient received IV Lexiscan 0.4 mg over 15-seconds.  Technetium 48m Sestamibi injected at 30-seconds.  There were no significant changes with Lexiscan.  Quantitative spect images were obtained after a 45 minute delay.  Transient Ischemic Dilatation (Normal <1.22):  1.05 Lung/Heart Ratio (Normal <0.45):  0.28 QGS EDV:  97  ml QGS ESV:  40 ml LV Ejection Fraction: 59%     Rest ECG: AV sequential paced  Stress ECG: No significant change from baseline ECG  QPS Raw Data Images:  Normal; no motion artifact; normal heart/lung ratio. Stress Images:  There is decreased uptake in the apical septum. Rest Images:  There is decreased uptake in the apical septum. Subtraction (SDS):  No evidence of ischemia.  Impression Exercise Capacity:  Lexiscan with no exercise. BP Response:  Normal blood pressure response. Clinical Symptoms:  No significant symptoms noted. ECG Impression:  No significant ECG changes with Lexiscan. Comparison with Prior Nuclear Study: No previous nuclear study performed  Overall Impression:  Small fixed apical septal defect may a pacing related artifact. Otherwise normal perfusion pattern. No reversible ischemia. This is a low risk study.  LV Wall Motion:  There is paradoxical septal motion and apical dyssynchrony, pacing related. Normal overall LV systolic function.   Wendy Fair, MD  03/23/2013 5:15 PM

## 2013-03-27 ENCOUNTER — Other Ambulatory Visit: Payer: Self-pay | Admitting: *Deleted

## 2013-03-27 MED ORDER — ATORVASTATIN CALCIUM 20 MG PO TABS: 20.0000 mg | ORAL_TABLET | Freq: Every day | ORAL | Status: DC

## 2013-03-27 NOTE — Progress Notes (Signed)
Quick Note:    Informed patient of results.

## 2013-03-27 NOTE — Progress Notes (Signed)
Quick Note:  Verbally informed patient of results. Also released into my chart. ______

## 2013-03-27 NOTE — Progress Notes (Signed)
Quick Note:  Spoke with patient informing her of dr. Landry Dyke findings and recommendations. Also released results into My chart. ______

## 2013-04-03 ENCOUNTER — Ambulatory Visit (INDEPENDENT_AMBULATORY_CARE_PROVIDER_SITE_OTHER): Payer: Medicare Other | Admitting: Internal Medicine

## 2013-04-03 ENCOUNTER — Encounter: Payer: Self-pay | Admitting: Internal Medicine

## 2013-04-03 VITALS — BP 136/82 | HR 80 | Temp 98.9°F | Wt 160.0 lb

## 2013-04-03 DIAGNOSIS — L259 Unspecified contact dermatitis, unspecified cause: Secondary | ICD-10-CM

## 2013-04-03 MED ORDER — HYDROCORTISONE 2.5 % EX CREA: TOPICAL_CREAM | Freq: Two times a day (BID) | CUTANEOUS | Status: DC

## 2013-04-03 MED ORDER — METHYLPREDNISOLONE ACETATE 80 MG/ML IJ SUSP
80.0000 mg | Freq: Once | INTRAMUSCULAR | Status: AC
  Administered 2013-04-03: 80 mg via INTRAMUSCULAR

## 2013-04-03 NOTE — Patient Instructions (Addendum)
Use Hytone cream 2 1/2% in axillae 3 times a day until rash is resolved. Given Depo-Medrol IM today. Stop using any deodorant whatsoever for 10 days. Then try Mitchum unscented deodorant.

## 2013-04-03 NOTE — Progress Notes (Signed)
  Subjective:    Patient ID: Wendy Rose, female    DOB: [birth year], 67 y.o.   MRN: [id number]  HPI Patient noticed a erythematous rash that was itchy in left tags a lot about a week ago. Stopped wearing deodorant. Rash has not significantly improved. It is still a bit itchy. She's never had anything like this before. Now beginning to have similar rash right axilla. Patient was hospitalized recently with significant bradycardia and received a pacemaker. Apparently during that hospitalization had allergic reaction to Cipro and IV contrast.    Review of Systems     Objective:   Physical Exam macular erythematous area left  axilla with discrete borders consistent with contact dermatitis. Beginning similar rash in right axilla. No other rashes.        Assessment & Plan:  Contact dermatitis  Plan: Stop using the deodorant. Do not use any deodorant for 7-10 days. May try unscented Mitchum deodorant after 10 days. Prescribed Hytone cream 2 1/2% to use in axillae 3 times a day sparingly. Depo-Medrol 80 mg IM given.

## 2013-04-25 ENCOUNTER — Ambulatory Visit (INDEPENDENT_AMBULATORY_CARE_PROVIDER_SITE_OTHER): Payer: Medicare Other | Admitting: Cardiovascular Disease

## 2013-04-25 ENCOUNTER — Encounter: Payer: Self-pay | Admitting: Cardiovascular Disease

## 2013-04-25 VITALS — BP 130/86 | HR 66 | Ht 70.5 in | Wt 157.9 lb

## 2013-04-25 DIAGNOSIS — G7289 Other specified myopathies: Secondary | ICD-10-CM

## 2013-04-25 DIAGNOSIS — I119 Hypertensive heart disease without heart failure: Secondary | ICD-10-CM

## 2013-04-25 DIAGNOSIS — Z950 Presence of cardiac pacemaker: Secondary | ICD-10-CM

## 2013-04-25 DIAGNOSIS — Z8679 Personal history of other diseases of the circulatory system: Secondary | ICD-10-CM

## 2013-04-25 DIAGNOSIS — G713 Mitochondrial myopathy, not elsewhere classified: Secondary | ICD-10-CM

## 2013-04-25 DIAGNOSIS — E785 Hyperlipidemia, unspecified: Secondary | ICD-10-CM

## 2013-04-25 DIAGNOSIS — E782 Mixed hyperlipidemia: Secondary | ICD-10-CM

## 2013-04-25 NOTE — Patient Instructions (Addendum)
Your physician recommends that you schedule a follow-up appointment in: 6 MONTHS. No changes were made today in your therapy.Your physician recommends that you return for lab work in: November.

## 2013-04-26 ENCOUNTER — Encounter: Payer: Self-pay | Admitting: Cardiovascular Disease

## 2013-04-26 NOTE — Progress Notes (Signed)
Patient ID: Wendy Rose, female   DOB: [birth year], 67 y.o.   MRN: [id number]       HPI:  Wendy Rose is a 67 year old female who presents to the office today for cardiology followup evaluation. I saw her on 03/14/2013 following her [facility] hospitalization.  has been diagnosed with mitochondrial myopathy. She also has been told in the past of having mitral valve prolapse. She has episodes of chronic weakness on 02/14/2013 she developed profound presyncopal spell and also did have some chest tightness. Hypotensive and bradycardic with heart rates in the 30s. She was seen initially by me [facility] emergency room and was transported to [facility]. She was started on dopamine. When I had seen her in the hospital questioned whether or not her conduction abnormalities were related to the possibility of Kearns-Sayre syndrome variant of mitochondrial myopathy. She has had some difficulty with vision but denied any episodes of ptosis. An echo Doppler study showed an ejection fraction of 55-60% without wall motion abnormalities. She did have a mildly calcified aortic annulus. She had mild MR. There was mild TR, and mild pulmonary hypertension with estimated RV systolic pressure at 40 mm. She ultimately required insertion of a permanent pacemaker which was done by Dr. Johney Frame on 02/16/2013 and  a Biotronik  pacemaker was inserted. She has felt improved with reference to any further episodes of presyncope. Family history is notable in that essentially all of her family members died with heart disease before the age 64. Most had had cardiac events in their 11s. Has noticed shortness of breath with activity. She does note weakness. She does note vague chest sensation. She presents for followup evaluation. She has been followed by Dr. Eden Emms Baxley  who has monitored her lipids and has not been on lipid-lowering therapy. When I recently saw her because of vague symptoms and shortness of breath and her strong family  history for coronary artery disease I recommended that she undergo a nuclear perfusion study to assess for potential ischemia. I also recommended advanced lipid testing. The nuclear study was done on 03/23/2013 and was essentially normal. There was a minimal apical defect with paradoxical septal motion with apical dyssynergy most likely related to her pacemaker. And overall normal systolic function. Was no evidence for ischemia. Advanced lipid testing revealed a total cholesterol 233 triglycerides 82 HDL 64 LDL 153. She has significant increased LDL particle number of 1937 Nanomoles per liter and had 627 small LDL particles. Insulin resistance score was excellent. As result, she was notified in atorvastatin 20 mg was initiated along with coenzyme Q 10.  Past Medical History  Diagnosis Date  . Fibromyalgia   . MVP (mitral valve prolapse)   . Vertigo   . Anemia   . Anxiety   . Symptomatic bradycardia     s/p Biotronik Evia dual chamber pacemaker implant 02/16/2013 by Dr Johney Frame  . Arthritis   . Chronic headaches   . Depression   . GERD (gastroesophageal reflux disease)   . Hyperlipemia   . IBS (irritable bowel syndrome)   . Pneumonia   . UTI (lower urinary tract infection)   . PONV (postoperative nausea and vomiting)   . S/P cardiac pacemaker procedure, 02/16/13, Bio tronik device 02/19/2013    Past Surgical History  Procedure Laterality Date  . Tonsillectomy and adenoidectomy    . Tympanoplasty    . Dilation and curettage of uterus    . Total abdominal hysterectomy    . Bladder suspension    .  Cesarean section    . Pacemaker insertion  02/16/2013    Biotronik Evia dual chamber pacemaker implanted by Dr Johney Frame    Allergies  Allergen Reactions  . Contrast Media [Iodinated Diagnostic Agents] Shortness Of Breath    "difficulty breathing"  . Ciprofloxacin Itching    Pt stated   . Cefuroxime Axetil     REACTION: Rash  . Codeine     REACTION: Reaction not known  . Naproxen      REACTION: Reaction not known  . Nitrofurantoin     REACTION: Increase LFT's  . Sulfonamide Derivatives     REACTION: Reaction not known  . Sulindac     REACTION: Reaction not known    Current Outpatient Prescriptions  Medication Sig Dispense Refill  . ALPRAZolam (XANAX) 1 MG tablet Take 1 tablet (1 mg total) by mouth at bedtime as needed.  60 tablet  2  . aspirin EC 81 MG tablet Take 81 mg by mouth at bedtime.      Marland Kitchen atorvastatin (LIPITOR) 20 MG tablet Take 1 tablet (20 mg total) by mouth daily.  30 tablet  6  . B Complex-C (B-COMPLEX WITH VITAMIN C) tablet Take 1 tablet by mouth daily.      Marland Kitchen buPROPion (WELLBUTRIN XL) 150 MG 24 hr tablet take 1 tablet by mouth every morning  30 tablet  11  . calcium-vitamin D (CALCIUM 500+D) 500-200 MG-UNIT per tablet Take 1 tablet by mouth daily with breakfast.      . co-enzyme Q-10 50 MG capsule Take 50 mg by mouth 2 (two) times daily.      . Creatinine POWD Take by mouth.      Marland Kitchen FLUoxetine (PROZAC) 40 MG capsule take 1 capsule by mouth once daily  30 capsule  PRN  . FLUZONE HIGH-DOSE injection       . HYDROcodone-acetaminophen (LORCET) 10-650 MG per tablet take 1 tablet by mouth every 8 hours if needed for pain  90 tablet  3  . hydrocortisone 2.5 % cream Apply topically 2 (two) times daily.  30 g  1  . lidocaine (LIDODERM) 5 % Place 1 patch onto the skin as needed.      . Magnesium Oxide (MAG-200 PO) Take by mouth.        . pantoprazole (PROTONIX) 40 MG tablet Take 1 tablet (40 mg total) by mouth 2 (two) times daily.  60 tablet  6  . Thiamine HCl (THIAMINE PO) Take by mouth.       . TRAMADOL HCL PO Take by mouth 2 (two) times daily. 50MG        No current facility-administered medications for this visit.    History   Social History  . Marital Status: Married    Spouse Name: N/A    Number of Children: N/A  . Years of Education: N/A   Occupational History  . Not on file.   Social History Main Topics  . Smoking status: Never Smoker   .  Smokeless tobacco: Never Used  . Alcohol Use: No  . Drug Use: No  . Sexual Activity: Yes   Other Topics Concern  . Not on file   Social History Narrative  . No narrative on file    ROS is negative for fevers, chills or night sweats. She does note weakness. History of fibromyalgia as well as anxiety. She notes shortness of breath. Mild visual symptoms but without ptosis. She denies abdominal pain. There is no change in bowel or bladder habits.  She denies bleeding. She denies significant edema; she denies tremors. .  Other system review is negative.  PE BP 130/86  Pulse 66  Ht 5' 10.5" (1.791 m)  Wt 157 lb 14.4 oz (71.623 kg)  BMI 22.33 kg/m2  General: Alert, oriented, no distress.  Skin: normal turgor, no rashes HEENT: Normocephalic, atraumatic. Pupils round and reactive; sclera anicteric;no lid lag.  Nose without nasal septal hypertrophy Mouth/Parynx benign; Mallinpatti scale 2 Neck: No JVD, no carotid briuts Lungs: clear to ausculatation and percussion; no wheezing or rales Heart: RRR, s1 s2 normal 1/6 sem Abdomen: soft, nontender; no hepatosplenomehaly, BS+; abdominal aorta nontender and not dilated by palpation. Pulses 2+ Extremities: no clubbing cyanosis or edema, Homan's sign negative  Neurologic: grossly nonfocal   LABS:  BMET    Component Value Date/Time   NA 133* 02/17/2013 0515   K 4.6 02/17/2013 0515   CL 103 02/17/2013 0515   CO2 15* 02/17/2013 0515   GLUCOSE 86 02/17/2013 0515   BUN 14 02/17/2013 0515   CREATININE 0.53 02/17/2013 0515   CREATININE 0.67 07/13/2012 1005   CALCIUM 8.3* 02/17/2013 0515   GFRNONAA >90 02/17/2013 0515   GFRAA >90 02/17/2013 0515     Hepatic Function Panel     Component Value Date/Time   PROT 6.1 02/14/2013 1508   ALBUMIN 3.4* 02/14/2013 1508   AST 20 02/14/2013 1508   ALT 19 02/14/2013 1508   ALKPHOS 73 02/14/2013 1508   BILITOT 0.3 02/14/2013 1508     CBC    Component Value Date/Time   WBC 6.7 02/14/2013 1508   RBC 4.18 02/14/2013  1508   HGB 12.0 02/14/2013 1508   HCT 35.5* 02/14/2013 1508   PLT 218 02/14/2013 1508   MCV 84.9 02/14/2013 1508   MCH 28.7 02/14/2013 1508   MCHC 33.8 02/14/2013 1508   RDW 12.9 02/14/2013 1508   LYMPHSABS 2.6 02/14/2013 1508   MONOABS 0.7 02/14/2013 1508   EOSABS 0.1 02/14/2013 1508   BASOSABS 0.0 02/14/2013 1508     BNP No results found for this basename: probnp    Lipid Panel     Component Value Date/Time   CHOL 275* 07/13/2012 1005   TRIG 82 03/20/2013 1019   TRIG 100 07/13/2012 1005   HDL 61 07/13/2012 1005   CHOLHDL 4.5 07/13/2012 1005   VLDL 20 07/13/2012 1005   LDLCALC 153* 03/20/2013 1019   LDLCALC 194* 07/13/2012 1005     RADIOLOGY: No results found.    ASSESSMENT AND PLAN: Ms. Ries is a 67 year old female with a diagnosis of mitochondrial myopathy who isstatus post insertion of a pacemaker for bradycardia induced presyncope.  She also has very strong family history for coronary artery disease with essentially all family members having heart disease before age 45. She has experienced episodes of shortness of breath with activity and has noticed some vague chest sensations. Her recent nuclear perfusion study arteries again significant ischemia. She did have mild apical septal defect related to her conduction abnormality. I did review her NMR lipoprofile in detail. She does have significant increased LDL particles. She seems to be tolerating the initiation of atorvastatin 20 mg. She apparently will be going out of town on October. She has not yet seen Dr. Johney Frame in followup of her pacemaker and this will need to be arranged. I'm recommending laboratory be rechecked upon her return in November. I will see her in 6 months for followup evaluation or sooner if problems arise.    Carol Loftin A.  Tresa Endo, MD, [facility]  04/26/2013 2:46 PM

## 2013-04-29 ENCOUNTER — Other Ambulatory Visit: Payer: Self-pay | Admitting: Internal Medicine

## 2013-04-29 NOTE — Telephone Encounter (Signed)
Pt. Should check with Cardiologist to see if he wants her to continue on this med. We will refill for 30 days only until she can find this out.

## 2013-05-03 ENCOUNTER — Other Ambulatory Visit: Payer: Self-pay

## 2013-05-03 MED ORDER — BUPROPION HCL ER (XL) 150 MG PO TB24: 150.0000 mg | ORAL_TABLET | ORAL | Status: DC

## 2013-05-09 ENCOUNTER — Telehealth: Payer: Self-pay | Admitting: Internal Medicine

## 2013-05-09 NOTE — Telephone Encounter (Signed)
Spoke with patient and gave her Dr. Aris Lot at [facility] @ [phone].  Patient will call to get an appointment.

## 2013-05-09 NOTE — Telephone Encounter (Signed)
Pt should call her dermatologist.

## 2013-05-10 ENCOUNTER — Telehealth: Payer: Self-pay | Admitting: Physician Assistant

## 2013-05-10 NOTE — Telephone Encounter (Addendum)
Pt called after-hours. She left to go to [facility] for an extended period of time and forgot the cord to the box that she uses to transmit her pacer interrogations. Otherwise she feels fine and has no other questions. I told her I would pass along to EP team to see if they have suggestions or information on locating one, and to call us back if she hasn't heard by the afternoon tomorrow. She verbalized understanding and gratitude. Irma Roulhac PA-C

## 2013-05-10 NOTE — Telephone Encounter (Signed)
Received page that pt called answering service requesting call back. I called back x 2 at number provided but got voicemail. LMOM to call us back if she still needs assistance. Lucindia Lemley PA-C

## 2013-05-11 ENCOUNTER — Telehealth: Payer: Self-pay | Admitting: Internal Medicine

## 2013-05-11 NOTE — Telephone Encounter (Signed)
New problem   Pt is waiting on a call for infor about getting a cord for her pacer. Please call pt

## 2013-05-12 ENCOUNTER — Telehealth: Payer: Self-pay | Admitting: *Deleted

## 2013-05-12 NOTE — Telephone Encounter (Signed)
Returned pt's call about forgetting her power cord.  She will be in [place] for "about a month."  I let her know it is okay for that duration of time due to having a ppm vs. ICD. She can reconnect when she returns to [place].  ROV w/ Dr. Johney Frame 07/28/13

## 2013-06-08 ENCOUNTER — Telehealth: Payer: Self-pay | Admitting: Cardiovascular Disease

## 2013-06-08 NOTE — Telephone Encounter (Signed)
Please call-concerning her Welbutrin-Dr Lenord Fellers wants to know if Dr Tresa Endo wants her to continue to take this?

## 2013-06-09 NOTE — Telephone Encounter (Signed)
Message forwarded to Dr. Kelly/Wanda, CMA.  

## 2013-06-12 NOTE — Telephone Encounter (Signed)
Spoke with patient in reference to the message she left about her wellbutrin. She states that she has been out of town and when she returned, she had a message on her answering machine to be sure that Dr. Tresa Endo knew that she is on the wellbutrin and call to see if he wants her to continue it. I recommended to patient to call Dr. Beryle Quant office to find out what they are referring to. Dr. Tresa Endo needs to know what they are referring to before he can answer the question. Patient states she will call their office to find out more information.

## 2013-06-14 ENCOUNTER — Telehealth: Payer: Self-pay | Admitting: Internal Medicine

## 2013-06-14 DIAGNOSIS — F329 Major depressive disorder, single episode, unspecified: Secondary | ICD-10-CM

## 2013-06-15 MED ORDER — BUPROPION HCL ER (XL) 150 MG PO TB24: 150.0000 mg | ORAL_TABLET | ORAL | Status: DC

## 2013-06-15 NOTE — Telephone Encounter (Signed)
Will refill. Please call her and let her know I will do electronically

## 2013-06-15 NOTE — Telephone Encounter (Signed)
Cardiologist says no concern with Wellbutrin. Refill for one year.

## 2013-06-19 ENCOUNTER — Other Ambulatory Visit: Payer: Medicare Other | Admitting: Internal Medicine

## 2013-06-19 DIAGNOSIS — E785 Hyperlipidemia, unspecified: Secondary | ICD-10-CM

## 2013-06-19 DIAGNOSIS — Z79899 Other long term (current) drug therapy: Secondary | ICD-10-CM

## 2013-06-19 LAB — LIPID PANEL
Cholesterol: 160 mg/dL (ref 0–200)
HDL: 69 mg/dL (ref >39)
Total CHOL/HDL Ratio: 2.3 Ratio
Triglycerides: 60 mg/dL (ref <150)
VLDL: 12 mg/dL (ref 0–40)

## 2013-06-19 LAB — HEPATIC FUNCTION PANEL
ALT: 18 U/L (ref 0–35)
Albumin: 4.2 g/dL (ref 3.5–5.2)
Bilirubin, Direct: 0.1 mg/dL (ref 0.0–0.3)
Indirect Bilirubin: 0.3 mg/dL (ref 0.0–0.9)
Total Protein: 6.2 g/dL (ref 6.0–8.3)

## 2013-06-20 ENCOUNTER — Ambulatory Visit (INDEPENDENT_AMBULATORY_CARE_PROVIDER_SITE_OTHER): Payer: Medicare Other | Admitting: Internal Medicine

## 2013-06-20 ENCOUNTER — Encounter: Payer: Self-pay | Admitting: Internal Medicine

## 2013-06-20 VITALS — BP 136/80 | HR 80 | Temp 98.3°F | Ht 71.0 in | Wt 159.0 lb

## 2013-06-20 DIAGNOSIS — F32A Depression, unspecified: Secondary | ICD-10-CM

## 2013-06-20 DIAGNOSIS — R413 Other amnesia: Secondary | ICD-10-CM

## 2013-06-20 DIAGNOSIS — F341 Dysthymic disorder: Secondary | ICD-10-CM

## 2013-06-20 DIAGNOSIS — G713 Mitochondrial myopathy, not elsewhere classified: Secondary | ICD-10-CM

## 2013-06-20 DIAGNOSIS — Z950 Presence of cardiac pacemaker: Secondary | ICD-10-CM

## 2013-06-20 DIAGNOSIS — Z8679 Personal history of other diseases of the circulatory system: Secondary | ICD-10-CM

## 2013-06-20 DIAGNOSIS — F329 Major depressive disorder, single episode, unspecified: Secondary | ICD-10-CM

## 2013-06-20 DIAGNOSIS — G7289 Other specified myopathies: Secondary | ICD-10-CM

## 2013-06-20 DIAGNOSIS — J309 Allergic rhinitis, unspecified: Secondary | ICD-10-CM

## 2013-06-20 NOTE — Patient Instructions (Signed)
Restart Wellbutrin. Return May or June 2015 for physical examination. Have neuropsychological testing done for memory loss.

## 2013-06-20 NOTE — Progress Notes (Signed)
  Subjective:    Patient ID: Wendy Rose, female    DOB: [birth year], 67 y.o.   MRN: [id number]  HPI  Had flu vaccine  [facility] on [employer] in late September. Pt thinks mitochondrial myopathy is worse. Feels weaker.  History of allergies to trees,dust, cats,tobacco. Pt has been coughing before Fall. Intermittent coughing associated with runny nose. Has been allergy tested over 25 years ago by Dr. Andria Meuse. May need to see allergist again.  Has had recent CXR in hospital. Pt had bradycardia, was hospitalized, and received a pacemaker.Dr. Tresa Endo says OK to refill Wellbutrin. A cousin has been diagnosed with Factor V Leiden mutation. Pt has never had any clots and takes ASA daily. Has chronic dysthymia and depression. Says she cannot remember anything. Wants to be tested for dementia. Mother had dementia in her 71's.     Review of Systems     Objective:   Physical Exam skin is warm and dry. Nodes none. HEENT exam: TMs and pharynx are clear. Neck is supple without JVD thyromegaly carotid bruits. Cardiac exam regular rate and rhythm. No hepatosplenomegaly. Extremities without edema. Affect is the same. She jumps from complaint to complaint and it's hard to keep her staying on track with reason for her visit. This is not new but may be a bit worse today. She was to have psychological testing.        Assessment & Plan:  History of mitochondrial myopathy  Recent pacemaker insertion  History of depression and anxiety  Plan: Patient was given name of [facility] Fl[facility] plus a written order for neuropsychological testing. Return Spring 2015.  25 minutes spent with patient. She was not aware her Wellbutrin had been refilled via e-scribe. I told her I refilled it last week so she can restart it.  Perhaps this will help her depression

## 2013-07-17 ENCOUNTER — Other Ambulatory Visit: Payer: Self-pay | Admitting: *Deleted

## 2013-07-17 MED ORDER — HYDROCODONE-ACETAMINOPHEN 10-650 MG PO TABS: 1.0000 | ORAL_TABLET | Freq: Four times a day (QID) | ORAL | Status: DC | PRN

## 2013-07-25 ENCOUNTER — Other Ambulatory Visit: Payer: Self-pay | Admitting: *Deleted

## 2013-07-25 MED ORDER — HYDROCODONE-ACETAMINOPHEN 10-325 MG PO TABS: 1.0000 | ORAL_TABLET | Freq: Four times a day (QID) | ORAL | Status: DC | PRN

## 2013-07-28 ENCOUNTER — Ambulatory Visit (INDEPENDENT_AMBULATORY_CARE_PROVIDER_SITE_OTHER): Payer: Medicare Other | Admitting: Internal Medicine

## 2013-07-28 ENCOUNTER — Encounter: Payer: Self-pay | Admitting: Internal Medicine

## 2013-07-28 VITALS — BP 126/73 | HR 79 | Ht 70.0 in | Wt 163.0 lb

## 2013-07-28 DIAGNOSIS — I495 Sick sinus syndrome: Secondary | ICD-10-CM

## 2013-07-28 DIAGNOSIS — Z950 Presence of cardiac pacemaker: Secondary | ICD-10-CM

## 2013-07-28 LAB — MDC_IDC_ENUM_SESS_TYPE_INCLINIC
Lead Channel Impedance Value: 760 Ohm
Lead Channel Pacing Threshold Amplitude: 1.1 V
Lead Channel Pacing Threshold Pulse Width: 0.4 ms
Lead Channel Pacing Threshold Pulse Width: 0.4 ms
Lead Channel Sensing Intrinsic Amplitude: 4.5 mV
Lead Channel Setting Pacing Amplitude: 1 V
Lead Channel Setting Pacing Pulse Width: 0.4 ms

## 2013-07-28 NOTE — Patient Instructions (Signed)
Remote monitoring is used to monitor your Pacemaker or ICD from home. This monitoring reduces the number of office visits required to check your device to one time per year. It allows [place] to keep an eye on the functioning of your device to ensure it is working properly. You are scheduled for a device check from home on 11/02/13. You may send your transmission at any time that day. If you have a wireless device, the transmission will be sent automatically. After your physician reviews your transmission, you will receive a postcard with your next transmission date.     Your physician wants you to follow-up in: July 2015 You will receive a reminder letter in the mail two months in advance. If you don't receive a letter, please call our office to schedule the follow-up appointment.

## 2013-07-28 NOTE — Progress Notes (Signed)
PCP: Margaree Mackintosh, MD Primary Cardiologist:  Dr Revonda Humphrey Pellegrini is a 67 y.o. female who presents today for routine electrophysiology followup.  Since last being seen in our clinic, the patient reports doing very well.  Today, she denies symptoms of palpitations, chest pain, shortness of breath,  lower extremity edema, dizziness, presyncope, or syncope.  The patient is otherwise without complaint today.   Past Medical History  Diagnosis Date  . Fibromyalgia   . MVP (mitral valve prolapse)   . Vertigo   . Anemia   . Anxiety   . Symptomatic bradycardia     s/p Biotronik Evia dual chamber pacemaker implant 02/16/2013 by Dr Johney Frame  . Arthritis   . Chronic headaches   . Depression   . GERD (gastroesophageal reflux disease)   . Hyperlipemia   . IBS (irritable bowel syndrome)   . Pneumonia   . UTI (lower urinary tract infection)   . PONV (postoperative nausea and vomiting)   . S/P cardiac pacemaker procedure, 02/16/13, Bio tronik device 02/19/2013   Past Surgical History  Procedure Laterality Date  . Tonsillectomy and adenoidectomy    . Tympanoplasty    . Dilation and curettage of uterus    . Total abdominal hysterectomy    . Bladder suspension    . Cesarean section    . Pacemaker insertion  02/16/2013    Biotronik Evia dual chamber pacemaker implanted by Dr Johney Frame    Current Outpatient Prescriptions  Medication Sig Dispense Refill  . ALPRAZolam (XANAX) 1 MG tablet Take 1 tablet (1 mg total) by mouth at bedtime as needed.  60 tablet  2  . aspirin EC 81 MG tablet Take 81 mg by mouth at bedtime.      Marland Kitchen atorvastatin (LIPITOR) 20 MG tablet Take 1 tablet (20 mg total) by mouth daily.  30 tablet  6  . B Complex-C (B-COMPLEX WITH VITAMIN C) tablet Take 1 tablet by mouth daily.      Marland Kitchen buPROPion (WELLBUTRIN XL) 150 MG 24 hr tablet Take 1 tablet (150 mg total) by mouth every morning.  30 tablet  11  . calcium-vitamin D (CALCIUM 500+D) 500-200 MG-UNIT per tablet Take 1 tablet by mouth daily  with breakfast.      . co-enzyme Q-10 50 MG capsule Take 50 mg by mouth 2 (two) times daily.      . Creatinine POWD Take by mouth as directed.       Marland Kitchen FLUoxetine (PROZAC) 40 MG capsule take 1 capsule by mouth once daily  30 capsule  PRN  . HYDROcodone-acetaminophen (NORCO) 10-325 MG per tablet Take 1 tablet by mouth every 6 (six) hours as needed.  90 tablet  0  . hydrocortisone 2.5 % cream Apply topically 2 (two) times daily.  30 g  1  . lidocaine (LIDODERM) 5 % Place 1 patch onto the skin as needed.      . Magnesium Oxide (MAG-200 PO) Take 1 tablet by mouth daily.       . pantoprazole (PROTONIX) 40 MG tablet Take 1 tablet (40 mg total) by mouth 2 (two) times daily.  60 tablet  6  . Thiamine HCl (THIAMINE PO) Take 1 tablet by mouth daily.       . TRAMADOL HCL PO Take by mouth 2 (two) times daily. 50MG        No current facility-administered medications for this visit.    Physical Exam: Filed Vitals:   07/28/13 1105  BP: 126/73  Pulse: 79  Height: 5\' 10"  (1.778 m)  Weight: 163 lb (73.936 kg)    GEN- The patient is well appearing, alert and oriented x 3 today.   Head- normocephalic, atraumatic Eyes-  Sclera clear, conjunctiva pink Ears- hearing intact Oropharynx- clear Lungs- Clear to ausculation bilaterally, normal work of breathing Chest- pacemaker pocket is well healed Heart- Regular rate and rhythm, no murmurs, rubs or gallops, PMI not laterally displaced GI- soft, NT, ND, + BS Extremities- no clubbing, cyanosis, or edema  Pacemaker interrogation- reviewed in detail today,  See PACEART report  Assessment and Plan:  1. Sinus bradycardia/ sick sinus syndrome Normal pacemaker function See Pace Art report No changes today  Remote monitoring every 3 months Return to see me annually for pacemaker follow-up

## 2013-07-30 ENCOUNTER — Other Ambulatory Visit: Payer: Self-pay | Admitting: Internal Medicine

## 2013-08-06 ENCOUNTER — Other Ambulatory Visit: Payer: Self-pay | Admitting: Internal Medicine

## 2013-08-06 NOTE — Telephone Encounter (Signed)
I thought we refilled this for 6 months. Please check.

## 2013-08-07 ENCOUNTER — Other Ambulatory Visit: Payer: Self-pay | Admitting: Internal Medicine

## 2013-09-04 DIAGNOSIS — F341 Dysthymic disorder: Secondary | ICD-10-CM

## 2013-09-04 DIAGNOSIS — R41840 Attention and concentration deficit: Secondary | ICD-10-CM

## 2013-09-04 DIAGNOSIS — R413 Other amnesia: Secondary | ICD-10-CM

## 2013-09-06 DIAGNOSIS — R413 Other amnesia: Secondary | ICD-10-CM

## 2013-09-06 DIAGNOSIS — F341 Dysthymic disorder: Secondary | ICD-10-CM

## 2013-09-06 DIAGNOSIS — R41840 Attention and concentration deficit: Secondary | ICD-10-CM

## 2013-09-14 ENCOUNTER — Telehealth: Payer: Self-pay | Admitting: *Deleted

## 2013-09-29 ENCOUNTER — Encounter: Payer: Self-pay | Admitting: Internal Medicine

## 2013-10-18 ENCOUNTER — Other Ambulatory Visit: Payer: Self-pay | Admitting: Internal Medicine

## 2013-10-19 NOTE — Telephone Encounter (Signed)
I believe she is taking this in place of Hydrocodone? Please check

## 2013-10-19 NOTE — Telephone Encounter (Signed)
Per letter from Dr. Estanislado Pandy re: pain contract. She will not refill Tramadol for her, since she takes Hydrocodone as well. Per verbal order of Dr. Renold Genta, will take over refills of her Tramodol.

## 2013-10-26 ENCOUNTER — Ambulatory Visit (INDEPENDENT_AMBULATORY_CARE_PROVIDER_SITE_OTHER): Payer: Medicare Other | Admitting: Cardiovascular Disease

## 2013-10-26 ENCOUNTER — Encounter: Payer: Self-pay | Admitting: Cardiovascular Disease

## 2013-10-26 VITALS — BP 138/70 | HR 79 | Ht 71.0 in | Wt 164.8 lb

## 2013-10-26 DIAGNOSIS — G713 Mitochondrial myopathy, not elsewhere classified: Secondary | ICD-10-CM

## 2013-10-26 DIAGNOSIS — G7289 Other specified myopathies: Secondary | ICD-10-CM

## 2013-10-26 DIAGNOSIS — M797 Fibromyalgia: Secondary | ICD-10-CM

## 2013-10-26 DIAGNOSIS — I495 Sick sinus syndrome: Secondary | ICD-10-CM

## 2013-10-26 DIAGNOSIS — Z8679 Personal history of other diseases of the circulatory system: Secondary | ICD-10-CM

## 2013-10-26 DIAGNOSIS — IMO0001 Reserved for inherently not codable concepts without codable children: Secondary | ICD-10-CM

## 2013-10-26 DIAGNOSIS — E785 Hyperlipidemia, unspecified: Secondary | ICD-10-CM

## 2013-10-26 DIAGNOSIS — Z950 Presence of cardiac pacemaker: Secondary | ICD-10-CM

## 2013-10-26 NOTE — Patient Instructions (Signed)
Your physician recommends that you schedule a follow-up appointment in: 1 year. No changes were made today in your therapy. 

## 2013-11-02 ENCOUNTER — Encounter: Payer: Medicare Other | Admitting: *Deleted

## 2013-11-15 ENCOUNTER — Encounter: Payer: Self-pay | Admitting: Cardiovascular Disease

## 2013-11-15 NOTE — Progress Notes (Signed)
Patient ID: AMIRA PODOLAK, female   DOB: 1946/01/05, 68 y.o.   MRN: [id number]      HPI:  Ms. Mcnorton is a 68 year old female who presents to the office today for a six month cardiology followup evaluation.   Ms Johannsen has been diagnosed with mitochondrial myopathy. She also has been told in the past of having mitral valve prolapse. She has episodes of chronic weakness on 02/14/2013 she developed profound presyncopal spell and also did have some chest tightness. She was hypotensive and bradycardic with heart rates in the 30s. She was seen initially by me [facility] emergency room and was transported to [facility]. She was started on dopamine. When I had seen her in the hospital questioned whether or not her conduction abnormalities were related to the possibility of Kearns-Sayre syndrome variant of mitochondrial myopathy. She has had some difficulty with vision but denied any episodes of ptosis. An echo Doppler study showed an ejection fraction of 55-60% without wall motion abnormalities. She did have a mildly calcified aortic annulus. She had mild MR. There was mild TR, and mild pulmonary hypertension with estimated RV systolic pressure at 40 mm. She ultimately required insertion of a permanent pacemaker which was done by Dr. Rayann Heman on 02/16/2013 and  a Biotronik  pacemaker was inserted. She has felt improved with reference to any further episodes of presyncope.  Family history is notable in that essentially all of her family members died with heart disease before the age 43. Most had had cardiac events in their 38s. Has noticed shortness of breath with activity. She does note weakness. She does note vague chest sensation. She presents for followup evaluation. She has been followed by Dr. Tommie Ard Baxley  who has monitored her lipids and has not been on lipid-lowering therapy. When I recently saw her because of vague symptoms and shortness of breath and her strong family history for coronary artery  disease I recommended that she undergo a nuclear perfusion study to assess for potential ischemia. I also recommended advanced lipid testing. A nuclear study was done on 03/23/2013 and was essentially normal. There was a minimal apical defect with paradoxical septal motion with apical dyssynergy most likely related to her pacemaker with overall normal systolic function and without  evidence for ischemia. Advanced lipid testing revealed a total cholesterol 233 triglycerides 82 HDL 64 LDL 153. She has significant increased LDL particle number of 1937 Nanomoles per liter and had 627 small LDL particles. Insulin resistance score was excellent. As result, she was notified in atorvastatin 20 mg was initiated along with coenzyme Q 10.  As I last saw her, she has felt well.  She denies significant further episodes of hypotension or presyncope since her permanent pacemaker implantation.  She denies episodes of chest pain.  She denies PND or orthopnea.  Subsequent laboratory has shown marked improvement in her lipid status with total cholesterol 160 triglycerides 60 LDL cholesterol 79, HDL cholesterol 69.  She presents for evaluation.  Past Medical History  Diagnosis Date  . Fibromyalgia   . MVP (mitral valve prolapse)   . Vertigo   . Anemia   . Anxiety   . Symptomatic bradycardia     s/p Biotronik Evia dual chamber pacemaker implant 02/16/2013 by Dr Rayann Heman  . Arthritis   . Chronic headaches   . Depression   . GERD (gastroesophageal reflux disease)   . Hyperlipemia   . IBS (irritable bowel syndrome)   . Pneumonia   . UTI (lower  urinary tract infection)   . PONV (postoperative nausea and vomiting)   . S/P cardiac pacemaker procedure, 02/16/13, Bio tronik device 02/19/2013    Past Surgical History  Procedure Laterality Date  . Tonsillectomy and adenoidectomy    . Tympanoplasty    . Dilation and curettage of uterus    . Total abdominal hysterectomy    . Bladder suspension    . Cesarean section    .  Pacemaker insertion  02/16/2013    Biotronik Evia dual chamber pacemaker implanted by Dr Rayann Heman    Allergies  Allergen Reactions  . Contrast Media [Iodinated Diagnostic Agents] Shortness Of Breath    "difficulty breathing"  . Ciprofloxacin Itching    Pt stated   . Cefuroxime Axetil     REACTION: Rash  . Codeine     REACTION: Reaction not known  . Naproxen     REACTION: Reaction not known  . Nitrofurantoin     REACTION: Increase LFT's  . Sulfonamide Derivatives     REACTION: Reaction not known  . Sulindac     REACTION: Reaction not known    Current Outpatient Prescriptions  Medication Sig Dispense Refill  . ALPRAZolam (XANAX) 1 MG tablet take 1 tablet by mouth at bedtime if needed  60 tablet  3  . aspirin EC 81 MG tablet Take 81 mg by mouth at bedtime.      Marland Kitchen atorvastatin (LIPITOR) 20 MG tablet Take 1 tablet (20 mg total) by mouth daily.  30 tablet  6  . B Complex-C (B-COMPLEX WITH VITAMIN C) tablet Take 1 tablet by mouth daily.      Marland Kitchen buPROPion (WELLBUTRIN XL) 150 MG 24 hr tablet Take 1 tablet (150 mg total) by mouth every morning.  30 tablet  11  . calcium-vitamin D (CALCIUM 500+D) 500-200 MG-UNIT per tablet Take 1 tablet by mouth daily with breakfast.      . co-enzyme Q-10 50 MG capsule Take 50 mg by mouth 2 (two) times daily.      . Creatinine POWD Take by mouth as directed.       Marland Kitchen FLUoxetine (PROZAC) 40 MG capsule take 1 capsule by mouth once daily  30 capsule  PRN  . HYDROcodone-acetaminophen (NORCO) 10-325 MG per tablet Take 1 tablet by mouth every 6 (six) hours as needed.  90 tablet  0  . hydrocortisone 2.5 % cream Apply topically 2 (two) times daily.  30 g  1  . lidocaine (LIDODERM) 5 % Place 1 patch onto the skin as needed.      . Magnesium Oxide (MAG-200 PO) Take 1 tablet by mouth daily.       . pantoprazole (PROTONIX) 40 MG tablet take 1 tablet by mouth twice a day  60 tablet  6  . Thiamine HCl (THIAMINE PO) Take 1 tablet by mouth daily.       . traMADol (ULTRAM)  50 MG tablet take 1 to 2 tablets by mouth twice a day  120 tablet  1   No current facility-administered medications for this visit.    History   Social History  . Marital Status: Married    Spouse Name: N/A    Number of Children: N/A  . Years of Education: N/A   Occupational History  . Not on file.   Social History Main Topics  . Smoking status: Never Smoker   . Smokeless tobacco: Never Used  . Alcohol Use: No  . Drug Use: No  . Sexual Activity: Yes  Other Topics Concern  . Not on file   Social History Narrative  . No narrative on file    ROS is negative for fevers, chills or night sweats.  She denies change in weight.  She denies a sense visual changes, no ptosis.  She denies change in hearing.  She does note weakness. History of fibromyalgia as well as anxiety. She notes shortness of breath.  No chest tightness She denies abdominal pain. There is no change in bowel or bladder habits. She denies bleeding. She denies significant edema; she denies tremors.  No claudication.  No seizures. Other comprehensive 14 point system review is negative.  PE BP 138/70  Pulse 79  Ht 5\' 11"  (1.803 m)  Wt 164 lb 12.8 oz (74.753 kg)  BMI 23.00 kg/m2  Repeat blood pressure by me was 140/70, supine, and was 126/72 standing. General: Alert, oriented, no distress.  Skin: normal turgor, no rashes HEENT: Normocephalic, atraumatic. Pupils round and reactive; sclera anicteric;no lid lag.  Nose without nasal septal hypertrophy Mouth/Parynx benign; Mallinpatti scale 2 Neck: No JVD, no carotid bruits Lungs: clear to ausculatation and percussion; no wheezing or rales Heart: RRR, s1 s2 normal 1/6 sem Abdomen: soft, nontender; no hepatosplenomehaly, BS+; abdominal aorta nontender and not dilated by palpation. Pulses 2+ Extremities: no clubbing cyanosis or edema, Homan's sign negative  Neurologic: grossly nonfocal Psychological: Normal affect and mood  ECG (independently read by me): AV paced  rhythm at 79 beats per minute.  LABS:  BMET    Component Value Date/Time   NA 133* 02/17/2013 0515   K 4.6 02/17/2013 0515   CL 103 02/17/2013 0515   CO2 15* 02/17/2013 0515   GLUCOSE 86 02/17/2013 0515   BUN 14 02/17/2013 0515   CREATININE 0.53 02/17/2013 0515   CREATININE 0.67 07/13/2012 1005   CALCIUM 8.3* 02/17/2013 0515   GFRNONAA >90 02/17/2013 0515   GFRAA >90 02/17/2013 0515     Hepatic Function Panel     Component Value Date/Time   PROT 6.2 06/19/2013 0947   ALBUMIN 4.2 06/19/2013 0947   AST 18 06/19/2013 0947   ALT 18 06/19/2013 0947   ALKPHOS 71 06/19/2013 0947   BILITOT 0.4 06/19/2013 0947   BILIDIR 0.1 06/19/2013 0947   IBILI 0.3 06/19/2013 0947     CBC    Component Value Date/Time   WBC 6.7 02/14/2013 1508   RBC 4.18 02/14/2013 1508   HGB 12.0 02/14/2013 1508   HCT 35.5* 02/14/2013 1508   PLT 218 02/14/2013 1508   MCV 84.9 02/14/2013 1508   MCH 28.7 02/14/2013 1508   MCHC 33.8 02/14/2013 1508   RDW 12.9 02/14/2013 1508   LYMPHSABS 2.6 02/14/2013 1508   MONOABS 0.7 02/14/2013 1508   EOSABS 0.1 02/14/2013 1508   BASOSABS 0.0 02/14/2013 1508     BNP No results found for this basename: probnp    Lipid Panel     Component Value Date/Time   CHOL 160 06/19/2013 0947   TRIG 60 06/19/2013 0947   TRIG 82 03/20/2013 1019   HDL 69 06/19/2013 0947   CHOLHDL 2.3 06/19/2013 0947   VLDL 12 06/19/2013 0947   LDLCALC 79 06/19/2013 0947   LDLCALC 153* 03/20/2013 1019     RADIOLOGY: No results found.    ASSESSMENT AND PLAN: Ms. Adcox is a 68 year old female with a diagnosis of mitochondrial myopathy and underwent permanent  Pacemaker insertion  for bradycardia induced presyncope.  She also has very strong family history for coronary artery  disease with essentially all family members having heart disease before age 31. She has experienced episodes of shortness of breath with activity and has noticed some vague chest sensations. Her recent nuclear perfusion study argues against  significant ischemia. She did have mild apical septal defect related to her conduction abnormality.  Her EKG shows an AV paced rhythm with 100% capture. Her lipid studies have improved with a change of present therapy, which she has tolerated.  Her blood pressure today is stable.  She is not significantly orthostatic: From supine to standing.  She's not having signs of edema.  She's not having any present myalgias.  As long as she remained stable, I will see her in one year for cardiology followup evaluation  Troy Sine, MD, [facility]  11/15/2013 6:16 PM

## 2013-11-17 ENCOUNTER — Other Ambulatory Visit: Payer: Self-pay | Admitting: *Deleted

## 2013-11-17 MED ORDER — ATORVASTATIN CALCIUM 20 MG PO TABS: 20.0000 mg | ORAL_TABLET | Freq: Every day | ORAL | Status: DC

## 2013-12-06 ENCOUNTER — Encounter (INDEPENDENT_AMBULATORY_CARE_PROVIDER_SITE_OTHER): Payer: Medicare Other | Admitting: Ophthalmology

## 2013-12-06 DIAGNOSIS — H2510 Age-related nuclear cataract, unspecified eye: Secondary | ICD-10-CM

## 2013-12-06 DIAGNOSIS — H43819 Vitreous degeneration, unspecified eye: Secondary | ICD-10-CM

## 2013-12-06 DIAGNOSIS — H35379 Puckering of macula, unspecified eye: Secondary | ICD-10-CM

## 2013-12-11 NOTE — Telephone Encounter (Signed)
Encounter Closed---12/11/13 TP 

## 2013-12-28 ENCOUNTER — Ambulatory Visit (INDEPENDENT_AMBULATORY_CARE_PROVIDER_SITE_OTHER): Payer: Medicare Other | Admitting: Internal Medicine

## 2013-12-28 ENCOUNTER — Encounter: Payer: Self-pay | Admitting: Internal Medicine

## 2013-12-28 VITALS — BP 148/82 | HR 84 | Temp 98.7°F | Wt 167.0 lb

## 2013-12-28 DIAGNOSIS — R82998 Other abnormal findings in urine: Secondary | ICD-10-CM

## 2013-12-28 DIAGNOSIS — N390 Urinary tract infection, site not specified: Secondary | ICD-10-CM

## 2013-12-28 DIAGNOSIS — R350 Frequency of micturition: Secondary | ICD-10-CM

## 2013-12-28 DIAGNOSIS — R3915 Urgency of urination: Secondary | ICD-10-CM

## 2013-12-28 LAB — POCT URINALYSIS DIPSTICK
Bilirubin, UA: NEGATIVE
Glucose, UA: NEGATIVE
Ketones, UA: NEGATIVE
Nitrite, UA: POSITIVE
PROTEIN UA: NEGATIVE
RBC UA: NEGATIVE
SPEC GRAV UA: 1.015
Urobilinogen, UA: NEGATIVE
pH, UA: 6

## 2013-12-28 MED ORDER — LEVOFLOXACIN 500 MG PO TABS: 500.0000 mg | ORAL_TABLET | Freq: Every day | ORAL | Status: DC

## 2013-12-28 NOTE — Patient Instructions (Signed)
Patient is not very clear if she truly has an allergy to Cipro. Says arm began to itch when she was given IV Cipro in the hospital at one point. Never broke out in a generalized rash. Were going to try Levaquin 500 milligrams daily for 7 days. She will call me if she has a reaction.

## 2013-12-28 NOTE — Progress Notes (Signed)
   Subjective:    Patient ID: Wendy Rose, female    DOB: [birth year], 69 y.o.   MRN: [id number]  HPI UTI symptoms with urgency and his urea. No fever or shaking chills. No back pain. There is a question as to whether or not she is allergic to Cipro. Says when she was in the hospital with a heart condition she was given IV Cipro and developed itching on her arm but never developed a generalized rash. Apparently Ceftin causes a rash and she is intolerant of sulfa drugs. Says urologist as given her trimethoprim in the past. I am concerned organism may not be sensitive to trimethoprim. Says that Leland caused elevated liver functions.    Review of Systems     Objective:   Physical Exam  Abnormal urinalysis. Culture taken. No CVA tenderness.      Assessment & Plan:  Urinary tract infection  Plan: Trial of Levaquin 500 milligrams daily with a meal for 7 days.

## 2013-12-31 LAB — URINE CULTURE: Colony Count: >=100000

## 2014-01-02 ENCOUNTER — Other Ambulatory Visit: Payer: Self-pay

## 2014-01-02 MED ORDER — AMOXICILLIN-POT CLAVULANATE 500-125 MG PO TABS: 1.0000 | ORAL_TABLET | Freq: Three times a day (TID) | ORAL | Status: DC

## 2014-01-02 NOTE — Progress Notes (Signed)
Patient informed. augmentin sent to [facility].

## 2014-01-03 ENCOUNTER — Other Ambulatory Visit: Payer: Self-pay | Admitting: Internal Medicine

## 2014-01-03 NOTE — Telephone Encounter (Signed)
Refill x 90 days 

## 2014-02-19 ENCOUNTER — Other Ambulatory Visit: Payer: Medicare Other | Admitting: Internal Medicine

## 2014-02-19 DIAGNOSIS — Z130 Encounter for screening for diseases of the blood and blood-forming organs and certain disorders involving the immune mechanism: Secondary | ICD-10-CM

## 2014-02-19 DIAGNOSIS — E785 Hyperlipidemia, unspecified: Secondary | ICD-10-CM

## 2014-02-19 DIAGNOSIS — Z1329 Encounter for screening for other suspected endocrine disorder: Secondary | ICD-10-CM

## 2014-02-19 DIAGNOSIS — Z79899 Other long term (current) drug therapy: Secondary | ICD-10-CM

## 2014-02-19 LAB — COMPREHENSIVE METABOLIC PANEL
ALT: 17 U/L (ref 0–35)
AST: 16 U/L (ref 0–37)
Albumin: 4.2 g/dL (ref 3.5–5.2)
Alkaline Phosphatase: 69 U/L (ref 39–117)
BILIRUBIN TOTAL: 0.6 mg/dL (ref 0.2–1.2)
BUN: 20 mg/dL (ref 6–23)
CO2: 29 mEq/L (ref 19–32)
CREATININE: 0.65 mg/dL (ref 0.50–1.10)
Calcium: 9 mg/dL (ref 8.4–10.5)
Chloride: 102 mEq/L (ref 96–112)
Glucose, Bld: 86 mg/dL (ref 70–99)
Potassium: 4.3 mEq/L (ref 3.5–5.3)
Sodium: 138 mEq/L (ref 135–145)
Total Protein: 5.8 g/dL — ABNORMAL LOW (ref 6.0–8.3)

## 2014-02-19 LAB — CBC WITH DIFFERENTIAL/PLATELET
BASOS ABS: 0 10*3/uL (ref 0.0–0.1)
Basophils Relative: 0 % (ref 0–1)
EOS ABS: 0.2 10*3/uL (ref 0.0–0.7)
Eosinophils Relative: 3 % (ref 0–5)
HCT: 38 % (ref 36.0–46.0)
Hemoglobin: 12.6 g/dL (ref 12.0–15.0)
Lymphocytes Relative: 43 % (ref 12–46)
Lymphs Abs: 2.4 10*3/uL (ref 0.7–4.0)
MCH: 28.1 pg (ref 26.0–34.0)
MCHC: 33.2 g/dL (ref 30.0–36.0)
MCV: 84.6 fL (ref 78.0–100.0)
Monocytes Absolute: 0.6 10*3/uL (ref 0.1–1.0)
Monocytes Relative: 11 % (ref 3–12)
Neutro Abs: 2.4 10*3/uL (ref 1.7–7.7)
Neutrophils Relative %: 43 % (ref 43–77)
PLATELETS: 219 10*3/uL (ref 150–400)
RBC: 4.49 MIL/uL (ref 3.87–5.11)
RDW: 14 % (ref 11.5–15.5)
WBC: 5.5 10*3/uL (ref 4.0–10.5)

## 2014-02-19 LAB — LIPID PANEL
CHOL/HDL RATIO: 2.5 ratio
CHOLESTEROL: 150 mg/dL (ref 0–200)
HDL: 60 mg/dL (ref >39)
LDL Cholesterol: 75 mg/dL (ref 0–99)
Triglycerides: 77 mg/dL (ref <150)
VLDL: 15 mg/dL (ref 0–40)

## 2014-02-20 ENCOUNTER — Other Ambulatory Visit: Payer: Medicare Other | Admitting: Internal Medicine

## 2014-02-20 LAB — TSH: TSH: 1.467 u[IU]/mL (ref 0.350–4.500)

## 2014-02-22 ENCOUNTER — Encounter: Payer: Self-pay | Admitting: Internal Medicine

## 2014-02-22 ENCOUNTER — Ambulatory Visit (INDEPENDENT_AMBULATORY_CARE_PROVIDER_SITE_OTHER): Payer: Medicare Other | Admitting: Internal Medicine

## 2014-02-22 VITALS — BP 120/80 | HR 76 | Temp 99.4°F | Ht 70.0 in | Wt 163.5 lb

## 2014-02-22 DIAGNOSIS — Z950 Presence of cardiac pacemaker: Secondary | ICD-10-CM

## 2014-02-22 DIAGNOSIS — N390 Urinary tract infection, site not specified: Secondary | ICD-10-CM

## 2014-02-22 DIAGNOSIS — G609 Hereditary and idiopathic neuropathy, unspecified: Secondary | ICD-10-CM

## 2014-02-22 DIAGNOSIS — IMO0001 Reserved for inherently not codable concepts without codable children: Secondary | ICD-10-CM

## 2014-02-22 DIAGNOSIS — M797 Fibromyalgia: Secondary | ICD-10-CM

## 2014-02-22 DIAGNOSIS — E785 Hyperlipidemia, unspecified: Secondary | ICD-10-CM

## 2014-02-22 DIAGNOSIS — F439 Reaction to severe stress, unspecified: Secondary | ICD-10-CM

## 2014-02-22 DIAGNOSIS — F411 Generalized anxiety disorder: Secondary | ICD-10-CM

## 2014-02-22 DIAGNOSIS — R82998 Other abnormal findings in urine: Secondary | ICD-10-CM

## 2014-02-22 DIAGNOSIS — Z733 Stress, not elsewhere classified: Secondary | ICD-10-CM

## 2014-02-22 DIAGNOSIS — G7289 Other specified myopathies: Secondary | ICD-10-CM

## 2014-02-22 DIAGNOSIS — F3289 Other specified depressive episodes: Secondary | ICD-10-CM

## 2014-02-22 DIAGNOSIS — G8929 Other chronic pain: Secondary | ICD-10-CM

## 2014-02-22 DIAGNOSIS — Z0000 Encounter for general adult medical examination without abnormal findings: Secondary | ICD-10-CM

## 2014-02-22 DIAGNOSIS — Z7189 Other specified counseling: Secondary | ICD-10-CM

## 2014-02-22 DIAGNOSIS — Z8709 Personal history of other diseases of the respiratory system: Secondary | ICD-10-CM

## 2014-02-22 DIAGNOSIS — F329 Major depressive disorder, single episode, unspecified: Secondary | ICD-10-CM

## 2014-02-22 DIAGNOSIS — Z8679 Personal history of other diseases of the circulatory system: Secondary | ICD-10-CM

## 2014-02-22 DIAGNOSIS — F32A Depression, unspecified: Secondary | ICD-10-CM

## 2014-02-22 DIAGNOSIS — Z23 Encounter for immunization: Secondary | ICD-10-CM

## 2014-02-22 DIAGNOSIS — Z87898 Personal history of other specified conditions: Secondary | ICD-10-CM

## 2014-02-22 DIAGNOSIS — G713 Mitochondrial myopathy, not elsewhere classified: Secondary | ICD-10-CM

## 2014-02-22 LAB — POCT URINALYSIS DIPSTICK
Bilirubin, UA: NEGATIVE
Blood, UA: NEGATIVE
Glucose, UA: NEGATIVE
KETONES UA: NEGATIVE
Nitrite, UA: POSITIVE
PH UA: 7.5
PROTEIN UA: NEGATIVE
Spec Grav, UA: <=1.005
Urobilinogen, UA: NEGATIVE

## 2014-02-22 MED ORDER — TETANUS-DIPHTH-ACELL PERTUSSIS 5-2.5-18.5 LF-MCG/0.5 IM SUSP: 0.5000 mL | Freq: Once | INTRAMUSCULAR | Status: DC

## 2014-02-26 ENCOUNTER — Other Ambulatory Visit: Payer: Self-pay

## 2014-02-26 ENCOUNTER — Telehealth: Payer: Self-pay | Admitting: Internal Medicine

## 2014-02-26 LAB — URINE CULTURE: Colony Count: >=100000

## 2014-02-26 MED ORDER — CEPHALEXIN 500 MG PO CAPS: 500.0000 mg | ORAL_CAPSULE | Freq: Four times a day (QID) | ORAL | Status: AC

## 2014-02-26 NOTE — Progress Notes (Signed)
Patient informed. 

## 2014-02-26 NOTE — Telephone Encounter (Signed)
Patient called  asking about bone density study. Faxed an order over to [facility] for mammogram and bone density study today

## 2014-03-21 ENCOUNTER — Other Ambulatory Visit: Payer: Self-pay | Admitting: Internal Medicine

## 2014-03-21 NOTE — Telephone Encounter (Signed)
Please refill Prozac for one year. Request came by fax. Refill Xanax for 6 months and Tramadol for 3 months at

## 2014-03-23 ENCOUNTER — Encounter: Payer: Self-pay | Admitting: *Deleted

## 2014-03-29 ENCOUNTER — Other Ambulatory Visit: Payer: Self-pay | Admitting: *Deleted

## 2014-03-29 MED ORDER — FLUOXETINE HCL 40 MG PO CAPS: 40.0000 mg | ORAL_CAPSULE | Freq: Every day | ORAL | Status: DC

## 2014-03-29 NOTE — Telephone Encounter (Signed)
rx sent to pharmacy by e-script  

## 2014-04-05 DIAGNOSIS — G609 Hereditary and idiopathic neuropathy, unspecified: Secondary | ICD-10-CM | POA: Insufficient documentation

## 2014-04-09 ENCOUNTER — Ambulatory Visit (INDEPENDENT_AMBULATORY_CARE_PROVIDER_SITE_OTHER): Payer: Medicare Other | Admitting: *Deleted

## 2014-04-09 ENCOUNTER — Other Ambulatory Visit (INDEPENDENT_AMBULATORY_CARE_PROVIDER_SITE_OTHER): Payer: Medicare Other

## 2014-04-09 DIAGNOSIS — I495 Sick sinus syndrome: Secondary | ICD-10-CM

## 2014-04-09 DIAGNOSIS — Z1211 Encounter for screening for malignant neoplasm of colon: Secondary | ICD-10-CM

## 2014-04-09 LAB — HEMOCCULT GUIAC POC 1CARD (OFFICE)
Card #2 Fecal Occult Blod, POC: NEGATIVE
Card #3 Fecal Occult Blood, POC: NEGATIVE
Fecal Occult Blood, POC: NEGATIVE

## 2014-04-09 LAB — MDC_IDC_ENUM_SESS_TYPE_INCLINIC
Brady Statistic RV Percent Paced: 100 %
Date Time Interrogation Session: 20150831144551
Implantable Pulse Generator Model: 359529
Lead Channel Impedance Value: 760 Ohm
Lead Channel Impedance Value: 799 Ohm
Lead Channel Pacing Threshold Amplitude: 0.3 V
Lead Channel Pacing Threshold Amplitude: 0.3 V
Lead Channel Pacing Threshold Amplitude: 0.4 V
Lead Channel Pacing Threshold Amplitude: 0.9 V
Lead Channel Pacing Threshold Amplitude: 0.9 V
Lead Channel Pacing Threshold Pulse Width: 0.4 ms
Lead Channel Pacing Threshold Pulse Width: 0.4 ms
Lead Channel Pacing Threshold Pulse Width: 0.4 ms
Lead Channel Sensing Intrinsic Amplitude: 25.1 mV
Lead Channel Sensing Intrinsic Amplitude: 3.7 mV
Lead Channel Setting Pacing Amplitude: 0.9 V
Lead Channel Setting Pacing Pulse Width: 0.4 ms
MDC IDC MSMT LEADCHNL RA PACING THRESHOLD PULSEWIDTH: 0.4 ms
MDC IDC MSMT LEADCHNL RV PACING THRESHOLD PULSEWIDTH: 0.4 ms
MDC IDC MSMT LEADCHNL RV SENSING INTR AMPL: 3.7 mV
MDC IDC PG SERIAL: 66385898
MDC IDC SET LEADCHNL RA PACING AMPLITUDE: 1.8 V
MDC IDC STAT BRADY RA PERCENT PACED: 75 %

## 2014-04-09 NOTE — Progress Notes (Signed)
Pacemaker check in clinic. Normal device function. Thresholds, sensing, impedances consistent with previous measurements. Device programmed to maximize longevity. 7 AT/AF recordings--- 0% burden but longest episode 5hr61min; no anti-coag due to fall risk. No high ventricular rates noted. Device programmed at appropriate safety margins. Histogram distribution appropriate for patient activity level. Device programmed to optimize intrinsic conduction. Estimated longevity 28yrs5mo. ROV w/ Dr. Rayann Heman in 65mo.

## 2014-04-12 ENCOUNTER — Telehealth: Payer: Self-pay | Admitting: Internal Medicine

## 2014-04-12 NOTE — Telephone Encounter (Signed)
New message     Pt has a pacemaker.  Can she have an EMG test at [place]?

## 2014-04-12 NOTE — Telephone Encounter (Signed)
Patient was advised to have dept at [facility] to call [phone] for Biotronik tech support and talk with them about stimulation test.  Patient is in agreement.

## 2014-04-12 NOTE — Telephone Encounter (Signed)
Patient states she has had diarrhea x 2 weeks.  It did stop briefly and is not back.  No fever, no blood/mucous in stools, no pain.  States she feels fine, just loose stools.  Adv to change her diet to clear liquids for 48-72 hours and give stomach a rest.  No milk products.  Then, advance diet slowly.  Patient to call us back should she develop fever, pain or see blood in stools which could be indicative of IBS.    Patient verbalized understanding of these instructions.

## 2014-04-17 ENCOUNTER — Encounter: Payer: Self-pay | Admitting: Internal Medicine

## 2014-04-24 ENCOUNTER — Encounter: Payer: Self-pay | Admitting: Internal Medicine

## 2014-04-27 ENCOUNTER — Encounter: Payer: Self-pay | Admitting: Internal Medicine

## 2014-04-27 ENCOUNTER — Encounter: Payer: Self-pay | Admitting: Physician Assistant

## 2014-04-27 ENCOUNTER — Ambulatory Visit (INDEPENDENT_AMBULATORY_CARE_PROVIDER_SITE_OTHER): Payer: Medicare Other | Admitting: Internal Medicine

## 2014-04-27 VITALS — BP 140/88 | HR 80 | Temp 97.6°F | Ht 70.0 in | Wt 160.0 lb

## 2014-04-27 DIAGNOSIS — Z733 Stress, not elsewhere classified: Secondary | ICD-10-CM

## 2014-04-27 DIAGNOSIS — K589 Irritable bowel syndrome without diarrhea: Secondary | ICD-10-CM

## 2014-04-27 DIAGNOSIS — F411 Generalized anxiety disorder: Secondary | ICD-10-CM

## 2014-04-27 DIAGNOSIS — R197 Diarrhea, unspecified: Secondary | ICD-10-CM

## 2014-04-27 DIAGNOSIS — F439 Reaction to severe stress, unspecified: Secondary | ICD-10-CM

## 2014-04-27 MED ORDER — ALPRAZOLAM 1 MG PO TABS: ORAL_TABLET | ORAL | Status: DC
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
## 2014-04-28 NOTE — Patient Instructions (Signed)
Continue same medications and return in 6 months. 3 Hemoccult cards given. Have colonoscopy.

## 2014-04-28 NOTE — Patient Instructions (Signed)
Appointment made with GI physician October 1. Increase Xanax as directed. Consider counseling.

## 2014-04-28 NOTE — Progress Notes (Signed)
Subjective:    Patient ID: Wendy Rose, female    DOB: 13-Oct-1945, 68 y.o.   MRN: [id number]  HPI Patient called to say she was continuing to have issues with diarrhea. Office visit was advised today. Patient says that about 3 weeks ago on a Saturday night she because of frozen yogurt and developed diarrhea thereafter. She had several episodes through the night and finally took some Imodium. Diarrhea persisted. She called here for advice and was told to stay with clear liquids and to advance diet slowly. Says she's been maintaining a brat diet is still is having several episodes of brown liquid stool daily. No blood in stool. No mucus in stool. Has tried to cut back on lactose but has not completely cut out yogurt so it's not clear if she is lactose intolerant. Has not traveled anywhere. She's under a great deal of stress. Her mother passed away 3 years ago. She is in the process of settling some affairs with her mother's estate and it has been quite stressful. She and her husband want to go to the beach for a month beginning October 1 but he has been diagnosed with pneumonia. A maternal niece has been diagnosed with colitis. Relatives on husband's side of the family are under stress with family member diagnosed with cancer/lymphoma. Patient is alarmed about all of this. She talked nonstop about this today.  Patient has a pacemaker. Is under the care of Dr. Rayann Heman. Patient has declined to be placed on a blood thinner. She has a history of mitochondrial myopathy seen at the [facility]. She has also a history of idiopathic peripheral neuropathy. Long-standing history of anxiety.  In 2012, she had an upper endoscopy by Dr. Scarlette Shorts.  Has been diagnosed with GE reflux. Continues to take PPI. Also has been diagnosed with irritable bowel syndrome. Has never had colonoscopy. Did return in 3 Hemoccult cards after physical examination in July which were negative for occult blood.  She is on Prozac for  long-standing history of depression. She takes hydrocodone/APAP for fibromyalgia pain. Has been taking Xanax just at night to sleep. Also has taken Wellbutrin. Is on Lipitor. Is on Protonix generic. Takes all [place] for pain as well.  Says she's reluctant to take blood thinner because she has had some falls likely due to the peripheral neuropathy.  Social history: Married. One adult son. Does not smoke.  Family history: Mother died with complications of a stroke. Father died at age 42 of an MI. One brother died of an MI with history of pacemaker and congenital heart defect. One sister with history of mitral valve prolapse, hypertension, congestive heart failure and pacemaker.  She's lost 3 lbs. 8 oz. from physical exam appointment in July.      Review of Systems  Constitutional: Positive for fatigue.  Cardiovascular:       History of pacemaker  Genitourinary:       History of recurrent urinary tract infections  Neurological:       Peripheral neuropathy and  mitochondrial myopathy  Psychiatric/Behavioral:       Long-standing history of anxiety depression.       Objective:   Physical Exam  Vitals reviewed. Constitutional: She is oriented to person, place, and time. She appears well-developed and well-nourished. No distress.  HENT:  Head: Normocephalic and atraumatic.  Neck: Neck supple. No thyromegaly present.  Cardiovascular: Normal rate and regular rhythm.   Pulmonary/Chest: Effort normal and breath sounds normal. No respiratory distress. She  has no wheezes. She has no rales.  Abdominal: Soft. Bowel sounds are normal. She exhibits no distension and no mass. There is no tenderness. There is no rebound and no guarding.  Neurological: She is alert and oriented to person, place, and time. She has normal reflexes. No cranial nerve deficit.  Skin: Skin is warm and dry. She is not diaphoretic.  Psychiatric: Her behavior is normal. Judgment and thought content normal.  Alvester Chou anxious.  Talking nonstop about stress in life.          Assessment & Plan:  Diarrhea-my feeling is that she has irritable bowel syndrome. She says that she had an episode like this when she was married to her first husband. This reminds her of the same thing.  Anxiety  History of pacemaker  History of fibromyalgia  Mitochondrial myopathy  Idiopathic peripheral neuropathy  Plan: GI consultation with Dr. Henrene Pastor. Appointment made for October 1. I'm going to increase her Xanax to 0.5 mg in the morning 0.5 mg in the afternoon and continue 1 mg at bedtime. Hopefully this will help with the herbal bowel symptoms and diarrhea. She may need to return to counseling for stress.

## 2014-05-10 ENCOUNTER — Encounter: Payer: Self-pay | Admitting: Physician Assistant

## 2014-05-10 ENCOUNTER — Ambulatory Visit (INDEPENDENT_AMBULATORY_CARE_PROVIDER_SITE_OTHER): Payer: Medicare Other | Admitting: Physician Assistant

## 2014-05-10 VITALS — BP 136/70 | HR 72 | Ht 70.0 in | Wt 163.0 lb

## 2014-05-10 DIAGNOSIS — K589 Irritable bowel syndrome without diarrhea: Secondary | ICD-10-CM

## 2014-05-10 DIAGNOSIS — R197 Diarrhea, unspecified: Secondary | ICD-10-CM

## 2014-05-10 MED ORDER — MOVIPREP 100 G PO SOLR
1.0000 | ORAL | Status: DC
Start: 2014-05-10 — End: 2014-08-24

## 2014-05-10 NOTE — Progress Notes (Signed)
Agree with initial assessment and plans 

## 2014-05-10 NOTE — Progress Notes (Signed)
Subjective:    Patient ID: Wendy Rose, female    DOB: 12/05/1945, 68 y.o.   MRN: [id number]  HPI  Wendy Rose  is a very nice 68 year old white female referred today by Dr. Renold Rose. She is known to Dr. Scarlette Rose with prior history of chronic GERD. She did undergo endoscopy in 2012 which was normal. She did have multiple benign fundic gland polyps. Patient was referred for diarrhea. She says this was an acute episode in last for about 2 weeks but has since resolved. She says a friend of hers had had a viral illness and she wonders if she had contracted that. She has not had any diarrhea in the past week and a half. She does state that she has IBS and generally has 2-3 bowel movements every day. She generally does not have any ongoing abdominal pain, no melena or hematochezia. Her appetite has been fine and her weight has been stable. Family history is negative for colon cancer polyps. Patient has not had a prior colonoscopy. She admits that she has been very anxious and distressed lately do to multiple family members being ill including her husband and she wonders if her diarrhea may been stress-induced. Her Xanax has been increasing she does feel that better. Medical problems include sick sinus syndrome status post pacemaker placement, depression, hyperlipidemia, fibromyalgia, and a mitochondrial myopathy. She says her muscles of her upper and lower extremities are week and she has to ambulate with a cane.    Review of Systems  HENT: Negative.   Eyes: Negative.   Respiratory: Negative.   Cardiovascular: Negative.   Gastrointestinal: Positive for diarrhea.  Endocrine: Negative.   Musculoskeletal: Positive for gait problem and myalgias.  Allergic/Immunologic: Negative.   Neurological: Positive for weakness.  Hematological: Negative.   Psychiatric/Behavioral: The patient is nervous/anxious.    Outpatient Prescriptions Prior to Visit  Medication Sig Dispense Refill  . ALPRAZolam (XANAX) 1 MG  tablet Take 1/2 tablet in the morning, take 1/2 tablet in evening and 1 at bedtime.  60 tablet  3  . aspirin EC 81 MG tablet Take 81 mg by mouth at bedtime.      Marland Kitchen atorvastatin (LIPITOR) 20 MG tablet Take 1 tablet (20 mg total) by mouth daily.  30 tablet  6  . B Complex-C (B-COMPLEX WITH VITAMIN C) tablet Take 1 tablet by mouth daily.      Marland Kitchen buPROPion (WELLBUTRIN XL) 150 MG 24 hr tablet Take 1 tablet (150 mg total) by mouth every morning.  30 tablet  11  . calcium-vitamin D (CALCIUM 500+D) 500-200 MG-UNIT per tablet Take 1 tablet by mouth 2 (two) times daily.       Marland Kitchen co-enzyme Q-10 50 MG capsule Take 50 mg by mouth 2 (two) times daily.      . Creatinine POWD Take by mouth as directed.       Marland Kitchen FLUoxetine (PROZAC) 40 MG capsule Take 1 capsule (40 mg total) by mouth daily.  30 capsule  11  . HYDROcodone-acetaminophen (NORCO) 10-325 MG per tablet Take 1 tablet by mouth every 6 (six) hours as needed.  90 tablet  0  . hydrocortisone 2.5 % cream Apply topically 2 (two) times daily.  30 g  1  . lidocaine (LIDODERM) 5 % Place 1 patch onto the skin as needed.      . Magnesium Oxide (MAG-200 PO) Take 1 tablet by mouth 2 (two) times daily.       . pantoprazole (PROTONIX) 40 MG  tablet take 1 tablet by mouth twice a day  60 tablet  6  . traMADol (ULTRAM) 50 MG tablet take 1 to 2 tablets by mouth twice a day if needed  120 tablet  2   Facility-Administered Medications Prior to Visit  Medication Dose Route Frequency Provider Last Rate Last Dose  . Tdap (BOOSTRIX) injection 0.5 mL  0.5 mL Intramuscular Once Elby Showers, MD       Allergies  Allergen Reactions  . Contrast Media [Iodinated Diagnostic Agents] Shortness Of Breath    "difficulty breathing"  . Ciprofloxacin Itching    Pt stated   . Cefuroxime Axetil     REACTION: Rash  . Codeine     REACTION: Reaction not known  . Naproxen     REACTION: Reaction not known  . Sulfonamide Derivatives     REACTION: Reaction not known  . Sulindac      REACTION: Reaction not known  . Nitrofurantoin Rash    REACTION: Increase LFT's   Patient Active Problem List   Diagnosis Date Noted  . Shortness of breath 03/23/2013  . S/P cardiac pacemaker procedure, 02/16/13, Bio tronik device 02/19/2013  . UTI (urinary tract infection) 02/17/2013  . Sick sinus syndrome 02/14/2013  . Hypotension 02/14/2013  . Near syncope 02/14/2013  . Cephalalgia 07/14/2012  . Mitochondrial myopathy 06/10/2012  . Proximal muscle weakness 06/10/2012  . Hyperlipidemia 06/16/2011  . Depression 06/16/2011  . Fibromyalgia syndrome 06/16/2011  . Anxiety 06/16/2011  . History of mitral valve prolapse 06/16/2011  . Allergic rhinitis 06/16/2011  . GE reflux 06/16/2011  . History of migraine headaches 06/16/2011   History  Substance Use Topics  . Smoking status: Never Smoker   . Smokeless tobacco: Never Used  . Alcohol Use: No      family history includes Diabetes in her sister; Heart disease in her brother and father; Hypertension in her mother. There is no history of Colon cancer, Esophageal cancer, Stomach cancer, or Rectal cancer. Objective:   Physical Exam   well-developed anxious white female in no acute distress, quite pleasant blood pressure 136/70 pulse 72 height 5 foot 10 weight 163. HEENT; nontraumatic normocephalic EOMI PERRLA sclera anicteric, Supple; no JVD, Cardiovascular; regular rate and rhythm with S1-S2 no murmur or gallop she does have a pacemaker, Pulmonary; clear bilaterally, Abdomen; soft nontender nondistended bowel sounds are active there is no palpable mass or hepatosplenomegaly, Rectal exam not done, Extremities; no clubbing cyanosis or edema skin warm and dry, Psych ;mood and affect appropriate     Assessment & Plan:   #54   68 year old female with a self-limited episode of diarrhea and underlying IBS. #2 colon neoplasia surveillance patient has not had prior colonoscopy #3 chronic GERD #4 sick sinus syndrome status post pacemaker  placement #5 mitochondrial myopathy #6 chronic anxiety-likely exacerbating #1  Plan ; schedule for colonoscopy with Dr. Scarlette Rose.Procedure discussed in detail with the patient and she is agreeable to proceed. Patient will call if she has any recurrence of prolonged diarrhea. She will continue daily probiotic use and when necessary Imodium.

## 2014-05-10 NOTE — Patient Instructions (Signed)
You have been scheduled for a colonoscopy. Please follow written instructions given to you at your visit today.  Please pick up your prep kit at the pharmacy within the next 1-3 days.RIte Aid [place] If you use inhalers (even only as needed), please bring them with you on the day of your procedure. Your physician has requested that you go to www.startemmi.com and enter the access code given to you at your visit today. This web site gives a general overview about your procedure. However, you should still follow specific instructions given to you by our office regarding your preparation for the procedure.

## 2014-05-14 ENCOUNTER — Telehealth: Payer: Self-pay | Admitting: Internal Medicine

## 2014-05-14 MED ORDER — HYDROCODONE-ACETAMINOPHEN 10-325 MG PO TABS: 1.0000 | ORAL_TABLET | Freq: Four times a day (QID) | ORAL | Status: DC | PRN

## 2014-05-14 NOTE — Telephone Encounter (Signed)
Hydrocodone rx printed to be signed.  Patient aware.

## 2014-05-14 NOTE — Telephone Encounter (Signed)
Hydrocodone 10-325 mg.  Take 1 tablet every 6 hours as needed for pain (normally takes the Tramadol), but states weather has been causing her to hurt more.  Was last filled in December.   Please call when ready to pick up.

## 2014-05-14 NOTE — Telephone Encounter (Signed)
Please refill Norco as requested

## 2014-05-16 ENCOUNTER — Encounter: Payer: Self-pay | Admitting: Internal Medicine

## 2014-05-25 ENCOUNTER — Encounter: Payer: Self-pay | Admitting: Internal Medicine

## 2014-05-28 ENCOUNTER — Telehealth: Payer: Self-pay | Admitting: *Deleted

## 2014-05-28 NOTE — Telephone Encounter (Signed)
LMOVM to call my line back directly in regards to pacemaker remote.

## 2014-05-31 NOTE — Telephone Encounter (Signed)
Left 2nd msg w/ my direct # to call back.

## 2014-06-08 ENCOUNTER — Telehealth: Payer: Self-pay | Admitting: *Deleted

## 2014-06-08 NOTE — Telephone Encounter (Signed)
Pt has been out of town at [employer], she is returning the messages we have left. Pt now having 95% atrial burden per home monitoring. Seeing Roderic Palau, NP on 06/11/14 to discuss anti-coagulation.

## 2014-06-11 ENCOUNTER — Encounter: Payer: Self-pay | Admitting: Internal Medicine

## 2014-06-11 ENCOUNTER — Other Ambulatory Visit (HOSPITAL_COMMUNITY): Payer: Self-pay | Admitting: Nurse Practitioner

## 2014-06-11 ENCOUNTER — Ambulatory Visit (INDEPENDENT_AMBULATORY_CARE_PROVIDER_SITE_OTHER): Payer: Medicare Other | Admitting: Internal Medicine

## 2014-06-11 VITALS — BP 118/64 | HR 57 | Ht 71.0 in | Wt 165.0 lb

## 2014-06-11 DIAGNOSIS — I4891 Unspecified atrial fibrillation: Secondary | ICD-10-CM

## 2014-06-11 DIAGNOSIS — Z45018 Encounter for adjustment and management of other part of cardiac pacemaker: Secondary | ICD-10-CM

## 2014-06-11 DIAGNOSIS — I484 Atypical atrial flutter: Secondary | ICD-10-CM

## 2014-06-11 LAB — MDC_IDC_ENUM_SESS_TYPE_INCLINIC
Brady Statistic RA Percent Paced: 62 %
Implantable Pulse Generator Model: 359529
Lead Channel Impedance Value: 760 Ohm
Lead Channel Impedance Value: 760 Ohm
Lead Channel Pacing Threshold Amplitude: 0.4 V
Lead Channel Pacing Threshold Pulse Width: 0.4 ms
Lead Channel Pacing Threshold Pulse Width: 0.4 ms
Lead Channel Sensing Intrinsic Amplitude: 25.5 mV
Lead Channel Sensing Intrinsic Amplitude: 5.9 mV
MDC IDC MSMT LEADCHNL RA SENSING INTR AMPL: 5.9 mV
MDC IDC MSMT LEADCHNL RV PACING THRESHOLD AMPLITUDE: 0.4 V
MDC IDC PG SERIAL: 66385898
MDC IDC SESS DTM: 20151102112427
MDC IDC SET LEADCHNL RA PACING AMPLITUDE: 1.8 V
MDC IDC SET LEADCHNL RV PACING AMPLITUDE: 2.4 V
MDC IDC SET LEADCHNL RV PACING PULSEWIDTH: 0.4 ms
MDC IDC STAT BRADY RV PERCENT PACED: 99 %

## 2014-06-11 MED ORDER — APIXABAN 5 MG PO TABS: 5.0000 mg | ORAL_TABLET | Freq: Two times a day (BID) | ORAL | Status: DC

## 2014-06-11 NOTE — Patient Instructions (Signed)
Your physician has recommended you make the following change in your medication:  1) STOP Aspirin 2) START Eliquis 5 mg twice a day  Your physician recommends that you schedule a follow-up appointment in: 3 weeks with Roderic Palau, NP & Dr. Rayann Heman.

## 2014-06-11 NOTE — Progress Notes (Signed)
PCP: Elby Showers, MD Primary Cardiologist:  Dr Murtis Sink Wendy Rose is a 68 y.o. female who presents today for routine electrophysiology followup. She has h/o PPM 02/2013 for bradycardia. Some high rate atrial episodes have been noted since implantation of device, but she was called to the office, for constant aflutter since 10/17 from which she is asymptomatic. Of note, pt has mitochrondia myopathy and walks with a cane, prone to falls, but no actual falls. She has resisted blood thinners in the past, Has a  CHA2DS2-VASc Score and unadjusted Ischemic Stroke Rate (% per year) is equal to2.2 % stroke rate/year from a score of 2 for age and being female.Crcl calculated at 96.6m/min.No bleeding risks.  Today, she denies symptoms of palpitations, chest pain, shortness of breath,  lower extremity edema, dizziness, presyncope, or syncope.  The patient is otherwise without complaint today.   Past Medical History  Diagnosis Date  . Fibromyalgia   . MVP (mitral valve prolapse)   . Vertigo   . Anemia   . Anxiety   . Symptomatic bradycardia     s/p Biotronik Evia dual chamber pacemaker implant 02/16/2013 by Dr ARayann Heman . Arthritis   . Chronic headaches   . Depression   . GERD (gastroesophageal reflux disease)   . Hyperlipemia   . IBS (irritable bowel syndrome)   . Pneumonia   . UTI (lower urinary tract infection)   . PONV (postoperative nausea and vomiting)   . S/P cardiac pacemaker procedure, 02/16/13, Bio tronik device 02/19/2013   Past Surgical History  Procedure Laterality Date  . Tonsillectomy and adenoidectomy    . Tympanoplasty    . Dilation and curettage of uterus    . Total abdominal hysterectomy    . Bladder suspension    . Cesarean section    . Pacemaker insertion  02/16/2013    Biotronik Evia dual chamber pacemaker implanted by Dr ARayann Heman   Current Outpatient Prescriptions  Medication Sig Dispense Refill  . ALPRAZolam (XANAX) 1 MG tablet Take 1/2 tablet in the morning, take 1/2  tablet in evening and 1 at bedtime. 60 tablet 3  . aspirin EC 81 MG tablet Take 81 mg by mouth at bedtime.    .Marland Kitchenatorvastatin (LIPITOR) 20 MG tablet Take 1 tablet (20 mg total) by mouth daily. 30 tablet 6  . B Complex-C (B-COMPLEX WITH VITAMIN C) tablet Take 1 tablet by mouth daily.    .Marland KitchenbuPROPion (WELLBUTRIN XL) 150 MG 24 hr tablet Take 1 tablet (150 mg total) by mouth every morning. 30 tablet 11  . calcium-vitamin D (CALCIUM 500+D) 500-200 MG-UNIT per tablet Take 1 tablet by mouth 2 (two) times daily.     .Marland Kitchenco-enzyme Q-10 50 MG capsule Take 50 mg by mouth 2 (two) times daily.    . Creatinine POWD Take by mouth as directed.     .Marland KitchenFLUoxetine (PROZAC) 40 MG capsule Take 1 capsule (40 mg total) by mouth daily. 30 capsule 11  . HYDROcodone-acetaminophen (NORCO) 10-325 MG per tablet Take 1 tablet by mouth every 6 (six) hours as needed. 90 tablet 0  . hydrocortisone 2.5 % cream Apply topically 2 (two) times daily. 30 g 1  . lidocaine (LIDODERM) 5 % Place 1 patch onto the skin as needed.    . Magnesium Oxide (MAG-200 PO) Take 1 tablet by mouth 2 (two) times daily.     .Marland KitchenMOVIPREP 100 G SOLR Take 1 kit (200 g total) by mouth as directed. 1 kit  0  . pantoprazole (PROTONIX) 40 MG tablet take 1 tablet by mouth twice a day 60 tablet 6  . traMADol (ULTRAM) 50 MG tablet take 1 to 2 tablets by mouth twice a day if needed 120 tablet 2   Current Facility-Administered Medications  Medication Dose Route Frequency Provider Last Rate Last Dose  . Tdap (BOOSTRIX) injection 0.5 mL  0.5 mL Intramuscular Once Elby Showers, MD        Physical Exam: Filed Vitals:   06/11/14 1011  BP: 118/64  Pulse: 57  Height: _0  (1.803 m)  Weight: 165 lb (74.844 kg)    GEN- The patient is well appearing, alert and oriented x 3 today.   Head- normocephalic, atraumatic Eyes-  Sclera clear, conjunctiva pink Ears- hearing intact Oropharynx- clear Lungs- Clear to ausculation bilaterally, normal work of breathing Chest-  pacemaker pocket is well healed Heart- Regular rate and rhythm, no murmurs, rubs or gallops, PMI not laterally displaced GI- soft, NT, ND, + BS Extremities- no clubbing, cyanosis, or edema  Pacemaker interrogation- reviewed in detail today,  See PACEART report EKG- shows atypical aflutter at 57 bpm, nsst/tw changes.  Assessment and Plan:  1. Sinus bradycardia/ sick sinus syndrome Normal pacemaker function See Pace Art report N o changes today  2. Asymptomatic atypical aflutter/stroke risk Risk vrs benefit of anticoagulation explained to pt with pt choosing to take NOAC,  Eliquis 5 mg bid(crcl 96.5).  Stop ASA F/u with Dr. Rayann Heman in 3 weeks for further treatment of arrhythmia.

## 2014-06-12 ENCOUNTER — Encounter: Payer: Self-pay | Admitting: Internal Medicine

## 2014-06-18 ENCOUNTER — Encounter: Payer: Self-pay | Admitting: Internal Medicine

## 2014-06-18 ENCOUNTER — Encounter: Payer: Medicare Other | Admitting: Internal Medicine

## 2014-06-26 ENCOUNTER — Telehealth: Payer: Self-pay | Admitting: *Deleted

## 2014-06-26 NOTE — Telephone Encounter (Signed)
Called in [place] for Eliquis. Eliquis approved through 06/27/2015.

## 2014-06-27 NOTE — Progress Notes (Unsigned)
Patient ID: Wendy Rose, female   DOB: 14-May-1946, 68 y.o.   MRN: [id number] Patient was approved for eliquis patient assistance until 11.17.16 Patient ID # [id number]

## 2014-06-29 ENCOUNTER — Ambulatory Visit (HOSPITAL_COMMUNITY): Payer: Medicare Other | Admitting: Nurse Practitioner

## 2014-07-02 ENCOUNTER — Ambulatory Visit (INDEPENDENT_AMBULATORY_CARE_PROVIDER_SITE_OTHER): Payer: Medicare Other | Admitting: Internal Medicine

## 2014-07-02 ENCOUNTER — Encounter: Payer: Self-pay | Admitting: Internal Medicine

## 2014-07-02 ENCOUNTER — Encounter: Payer: Medicare Other | Admitting: Internal Medicine

## 2014-07-02 VITALS — BP 130/80 | HR 61 | Ht 71.0 in | Wt 168.0 lb

## 2014-07-02 DIAGNOSIS — I484 Atypical atrial flutter: Secondary | ICD-10-CM

## 2014-07-02 DIAGNOSIS — I495 Sick sinus syndrome: Secondary | ICD-10-CM

## 2014-07-02 LAB — MDC_IDC_ENUM_SESS_TYPE_INCLINIC
Brady Statistic RA Percent Paced: 2 %
Implantable Pulse Generator Model: 359529
Lead Channel Impedance Value: 663 Ohm
Lead Channel Impedance Value: 799 Ohm
Lead Channel Pacing Threshold Amplitude: 0.4 V
Lead Channel Pacing Threshold Pulse Width: 0.4 ms
Lead Channel Pacing Threshold Pulse Width: 0.4 ms
Lead Channel Setting Pacing Amplitude: 0.9 V
Lead Channel Setting Pacing Amplitude: 1.8 V
Lead Channel Setting Pacing Pulse Width: 0.4 ms
MDC IDC MSMT LEADCHNL RA PACING THRESHOLD AMPLITUDE: 1.1 V
MDC IDC MSMT LEADCHNL RA SENSING INTR AMPL: 3.5 mV
MDC IDC MSMT LEADCHNL RV SENSING INTR AMPL: 25.7 mV
MDC IDC PG SERIAL: 66385898
MDC IDC SESS DTM: 20151123154442
MDC IDC STAT BRADY RV PERCENT PACED: 82 %

## 2014-07-02 NOTE — Patient Instructions (Signed)
Your physician recommends that you schedule a follow-up appointment in: 4  Months with Roderic Palau, NP in the afib clinic at the hospital

## 2014-07-02 NOTE — Progress Notes (Signed)
PCP: Elby Showers, MD Primary Cardiologist:  Dr Murtis Sink Cullinan is a 68 y.o. female who presents today for routine electrophysiology followup. She has h/o PPM 02/2013 for bradycardia. Some high rate atrial episodes have been noted since implantation of device, but she was called to the office, for constant aflutter since 10/17 from which she is asymptomatic. Of note, pt has mitochrondia myopathy and walks with a cane, prone to falls, but no actual falls. She has resisted blood thinners in the past, Has a  CHA2DS2-VASc Score and unadjusted Ischemic Stroke Rate (% per year) is equal to2.2 % stroke rate/year from a score of 2 for age and being female.Crcl calculated at 96.36m/min.No bleeding risks.  Interrogation of device reveals she went back in into SR yesterday. Again, she  Is asymptomatic and was unaware she was in aflutter and feels no different bing in SR.Tolerating NOAC started on last visit. Walks with a cane due to mitochrondia myopathy, has had a few stumbles but no significant falls, but this will have to watched in the future if fall risk becomes higher  than benefit of drug.  Today, she denies symptoms of palpitations, chest pain, shortness of breath,  lower extremity edema, dizziness, presyncope, or syncope. No bleeding issues with NOAC. The patient is otherwise without complaint today.   Past Medical History  Diagnosis Date  . Fibromyalgia   . MVP (mitral valve prolapse)   . Vertigo   . Anemia   . Anxiety   . Symptomatic bradycardia     s/p Biotronik Evia dual chamber pacemaker implant 02/16/2013 by Dr ARayann Heman . Arthritis   . Chronic headaches   . Depression   . GERD (gastroesophageal reflux disease)   . Hyperlipemia   . IBS (irritable bowel syndrome)   . Pneumonia   . UTI (lower urinary tract infection)   . PONV (postoperative nausea and vomiting)   . S/P cardiac pacemaker procedure, 02/16/13, Bio tronik device 02/19/2013   Past Surgical History  Procedure Laterality Date   . Tonsillectomy and adenoidectomy    . Tympanoplasty    . Dilation and curettage of uterus    . Total abdominal hysterectomy    . Bladder suspension    . Cesarean section    . Pacemaker insertion  02/16/2013    Biotronik Evia dual chamber pacemaker implanted by Dr ARayann Heman   Current Outpatient Prescriptions  Medication Sig Dispense Refill  . ALPRAZolam (XANAX) 1 MG tablet Take 1/2 tablet in the morning, take 1/2 tablet in evening and 1 at bedtime. (Patient taking differently: Take 1/2 tablet by mouth in the morning, take 1/2 tablet by mouth in evening and 1 tablet by mouth at bedtime.) 60 tablet 3  . apixaban (ELIQUIS) 5 MG TABS tablet Take 1 tablet (5 mg total) by mouth 2 (two) times daily. 60 tablet 0  . atorvastatin (LIPITOR) 20 MG tablet Take 1 tablet (20 mg total) by mouth daily. 30 tablet 6  . B Complex-C (B-COMPLEX WITH VITAMIN C) tablet Take 1 tablet by mouth daily.    .Marland KitchenbuPROPion (WELLBUTRIN XL) 150 MG 24 hr tablet Take 1 tablet (150 mg total) by mouth every morning. 30 tablet 11  . calcium-vitamin D (CALCIUM 500+D) 500-200 MG-UNIT per tablet Take 1 tablet by mouth 2 (two) times daily.     .Marland Kitchenco-enzyme Q-10 50 MG capsule Take 50 mg by mouth 2 (two) times daily.    . Creatinine POWD Take by mouth as directed.     .Marland Kitchen  FLUoxetine (PROZAC) 40 MG capsule Take 1 capsule (40 mg total) by mouth daily. 30 capsule 11  . hydrocortisone 2.5 % cream Apply topically 2 (two) times daily. 30 g 1  . lidocaine (LIDODERM) 5 % Place 1 patch onto the skin daily as needed (pain).     . Magnesium Oxide (MAG-200 PO) Take 1 tablet by mouth 2 (two) times daily.     Marland Kitchen MOVIPREP 100 G SOLR Take 1 kit (200 g total) by mouth as directed. 1 kit 0  . pantoprazole (PROTONIX) 40 MG tablet take 1 tablet by mouth twice a day 60 tablet 6  . traMADol (ULTRAM) 50 MG tablet take 1 to 2 tablets by mouth twice a day if needed (Patient taking differently: take 1 to 2 tablets by mouth twice a day if needed for pain) 120 tablet 2   . HYDROcodone-acetaminophen (NORCO) 10-325 MG per tablet Take 1 tablet by mouth every 6 (six) hours as needed. (Patient not taking: Reported on 07/02/2014) 90 tablet 0   Current Facility-Administered Medications  Medication Dose Route Frequency Provider Last Rate Last Dose  . Tdap (BOOSTRIX) injection 0.5 mL  0.5 mL Intramuscular Once Elby Showers, MD        Physical Exam: Filed Vitals:   07/02/14 1405  BP: 130/80  Pulse: 61  Height: _0  (1.803 m)  Weight: 168 lb (76.204 kg)    GEN- The patient is well appearing, alert and oriented x 3 today.   Head- normocephalic, atraumatic Eyes-  Sclera clear, conjunctiva pink Ears- hearing intact Oropharynx- clear Lungs- Clear to ausculation bilaterally, normal work of breathing Chest- pacemaker pocket is well healed Heart- Regular rate and rhythm, no murmurs, rubs or gallops, PMI not laterally displaced GI- soft, NT, ND, + BS Extremities- no clubbing, cyanosis, or edema  Pacemaker interrogation- reviewed in detail today,  See PACEART report. Resumed SR on 11/22. EKG- none today,  Last visit reveals atypical atrial flutter  Assessment and Plan:  1. Sinus bradycardia/ sick sinus syndrome Normal pacemaker function See Pace Art report No changes today  2. Asymptomatic atypical aflutter/stroke risk Prior ekg reveals atypical but possibly isthmus dependant atrial flutter Continue  Eliquis 5 mg bid(crcl 96.5).  Fall precaution with use of cane. Encouraged to look into a seated walker.  If risks of falls increases then we may have to stop anticoagulation at some point.  She is clear that she would like to avoid ablation at this time. F/u afib clinic in 3 months.

## 2014-07-03 ENCOUNTER — Encounter: Payer: Self-pay | Admitting: Internal Medicine

## 2014-07-09 ENCOUNTER — Other Ambulatory Visit: Payer: Self-pay | Admitting: Internal Medicine

## 2014-07-19 ENCOUNTER — Encounter (HOSPITAL_COMMUNITY): Payer: Self-pay | Admitting: Internal Medicine

## 2014-07-23 ENCOUNTER — Other Ambulatory Visit: Payer: Self-pay | Admitting: Internal Medicine

## 2014-07-23 ENCOUNTER — Other Ambulatory Visit: Payer: Self-pay | Admitting: *Deleted

## 2014-07-23 MED ORDER — APIXABAN 5 MG PO TABS: 5.0000 mg | ORAL_TABLET | Freq: Two times a day (BID) | ORAL | Status: DC

## 2014-07-23 NOTE — Telephone Encounter (Signed)
Refill once 

## 2014-07-23 NOTE — Telephone Encounter (Signed)
Tramadol called into [facility].

## 2014-07-24 ENCOUNTER — Other Ambulatory Visit: Payer: Self-pay

## 2014-07-24 MED ORDER — ATORVASTATIN CALCIUM 20 MG PO TABS: 20.0000 mg | ORAL_TABLET | Freq: Every day | ORAL | Status: DC

## 2014-08-20 ENCOUNTER — Other Ambulatory Visit: Payer: Self-pay | Admitting: Internal Medicine

## 2014-08-22 ENCOUNTER — Encounter: Payer: Self-pay | Admitting: Internal Medicine

## 2014-08-23 ENCOUNTER — Other Ambulatory Visit: Payer: Medicare Other | Admitting: Internal Medicine

## 2014-08-23 DIAGNOSIS — Z9289 Personal history of other medical treatment: Secondary | ICD-10-CM | POA: Diagnosis not present

## 2014-08-23 DIAGNOSIS — Z79899 Other long term (current) drug therapy: Secondary | ICD-10-CM

## 2014-08-23 DIAGNOSIS — E785 Hyperlipidemia, unspecified: Secondary | ICD-10-CM

## 2014-08-23 LAB — HEPATIC FUNCTION PANEL
ALT: 21 U/L (ref 0–35)
AST: 19 U/L (ref 0–37)
Albumin: 4.3 g/dL (ref 3.5–5.2)
Alkaline Phosphatase: 73 U/L (ref 39–117)
BILIRUBIN INDIRECT: 0.5 mg/dL (ref 0.2–1.2)
BILIRUBIN TOTAL: 0.6 mg/dL (ref 0.2–1.2)
Bilirubin, Direct: 0.1 mg/dL (ref 0.0–0.3)
Total Protein: 6.6 g/dL (ref 6.0–8.3)

## 2014-08-23 LAB — LIPID PANEL
CHOLESTEROL: 168 mg/dL (ref 0–200)
HDL: 62 mg/dL (ref >39)
LDL CALC: 88 mg/dL (ref 0–99)
TRIGLYCERIDES: 91 mg/dL (ref <150)
Total CHOL/HDL Ratio: 2.7 Ratio
VLDL: 18 mg/dL (ref 0–40)

## 2014-08-24 ENCOUNTER — Encounter: Payer: Self-pay | Admitting: Internal Medicine

## 2014-08-24 ENCOUNTER — Ambulatory Visit (INDEPENDENT_AMBULATORY_CARE_PROVIDER_SITE_OTHER): Payer: Medicare Other | Admitting: Internal Medicine

## 2014-08-24 VITALS — BP 130/80 | HR 80 | Temp 97.8°F | Wt 163.0 lb

## 2014-08-24 DIAGNOSIS — M25561 Pain in right knee: Secondary | ICD-10-CM | POA: Diagnosis not present

## 2014-08-24 NOTE — Patient Instructions (Addendum)
Take Xanax as prescribed. Return in 6 months. Have colonoscopy. Continue same meds.

## 2014-09-09 NOTE — Progress Notes (Signed)
   Subjective:    Patient ID: Wendy Rose, female    DOB: 30-Oct-1945, 69 y.o.   MRN: [id number]  HPI 69 year old White female with multiple medical problems. Here for six-month recheck. Her husband has been ill recently and she's been going to many doctor's appointments which is been stressful for her. She's here to follow-up on medical issues. She has a history of pacemaker, mitochondrial myopathy, hyperlipidemia, fibromyalgia syndrome, anxiety, GE reflux, migraine headaches, asthma, depression, situational stress, asthma, and recent diarrhea.  It was felt she may have had an exacerbation of irritable bowel syndrome recently. Realizes she's not dealing well with stress in her life.    Review of Systems     Objective:   Physical Exam  Neck is supple without JVD thyromegaly or carotid bruits. Chest clear to auscultation. Cardiac exam regular rate and rhythm normal S1 and S2. Extremities without edema. Psychiatric: she's talking nonstop today about stress in her life and husband's illness.      Assessment & Plan:  Mitochondrial myopathy-followed at [place] reflux-stable  Irritable bowel syndrome-diarrhea needs to be evaluated. Colonoscopy to be arranged.  History of pacemaker  Fibromyalgia  Anxiety depression treated with SSRI and Xanax. Needs to take Xanax more frequently.  Plan: Return July 2016 for physical exam.

## 2014-09-19 ENCOUNTER — Other Ambulatory Visit: Payer: Self-pay

## 2014-09-19 MED ORDER — APIXABAN 5 MG PO TABS: 5.0000 mg | ORAL_TABLET | Freq: Two times a day (BID) | ORAL | Status: DC

## 2014-10-16 ENCOUNTER — Encounter: Payer: Self-pay | Admitting: Nurse Practitioner

## 2014-10-16 ENCOUNTER — Encounter: Payer: Self-pay | Admitting: Internal Medicine

## 2014-10-16 ENCOUNTER — Ambulatory Visit (INDEPENDENT_AMBULATORY_CARE_PROVIDER_SITE_OTHER): Payer: Medicare Other | Admitting: *Deleted

## 2014-10-16 ENCOUNTER — Ambulatory Visit (INDEPENDENT_AMBULATORY_CARE_PROVIDER_SITE_OTHER): Payer: Medicare Other | Admitting: Nurse Practitioner

## 2014-10-16 VITALS — BP 138/80 | HR 60 | Ht 71.0 in | Wt 165.4 lb

## 2014-10-16 DIAGNOSIS — I484 Atypical atrial flutter: Secondary | ICD-10-CM | POA: Diagnosis not present

## 2014-10-16 DIAGNOSIS — I495 Sick sinus syndrome: Secondary | ICD-10-CM | POA: Diagnosis not present

## 2014-10-16 DIAGNOSIS — I4892 Unspecified atrial flutter: Secondary | ICD-10-CM | POA: Diagnosis not present

## 2014-10-16 LAB — BASIC METABOLIC PANEL
BUN: 18 mg/dL (ref 6–23)
CALCIUM: 9.4 mg/dL (ref 8.4–10.5)
CHLORIDE: 105 meq/L (ref 96–112)
CO2: 31 meq/L (ref 19–32)
CREATININE: 0.62 mg/dL (ref 0.40–1.20)
GFR: 101.2 mL/min (ref >60.00)
Glucose, Bld: 90 mg/dL (ref 70–99)
POTASSIUM: 4.3 meq/L (ref 3.5–5.1)
SODIUM: 139 meq/L (ref 135–145)

## 2014-10-16 LAB — MDC_IDC_ENUM_SESS_TYPE_INCLINIC
Battery Remaining Longevity: 9
Implantable Pulse Generator Model: 359529
Implantable Pulse Generator Serial Number: 66385898
Lead Channel Pacing Threshold Amplitude: 0.4 V
Lead Channel Pacing Threshold Amplitude: 0.4 V
Lead Channel Pacing Threshold Amplitude: 0.8 V
Lead Channel Pacing Threshold Pulse Width: 0.4 ms
Lead Channel Pacing Threshold Pulse Width: 0.4 ms
Lead Channel Sensing Intrinsic Amplitude: 24.2 mV
Lead Channel Sensing Intrinsic Amplitude: 4.2 mV
Lead Channel Setting Pacing Amplitude: 1.8 V
Lead Channel Setting Pacing Pulse Width: 0.4 ms
MDC IDC MSMT LEADCHNL RA IMPEDANCE VALUE: 702 Ohm
MDC IDC MSMT LEADCHNL RA PACING THRESHOLD AMPLITUDE: 0.8 V
MDC IDC MSMT LEADCHNL RA PACING THRESHOLD PULSEWIDTH: 0.4 ms
MDC IDC MSMT LEADCHNL RA SENSING INTR AMPL: 4.2 mV
MDC IDC MSMT LEADCHNL RV IMPEDANCE VALUE: 838 Ohm
MDC IDC MSMT LEADCHNL RV PACING THRESHOLD PULSEWIDTH: 0.4 ms
MDC IDC SESS DTM: 20160308170641
MDC IDC SET LEADCHNL RV PACING AMPLITUDE: 2.4 V
MDC IDC STAT BRADY RA PERCENT PACED: 53 %
MDC IDC STAT BRADY RV PERCENT PACED: 96 %

## 2014-10-16 NOTE — Patient Instructions (Signed)
Your physician wants you to follow-up in: 6 months with Dr. Rayann Heman. You will receive a reminder letter in the mail two months in advance. If you don't receive a letter, please call our office to schedule the follow-up appointment.  Your physician recommends that you return for lab work in: today for [employer]

## 2014-10-16 NOTE — Progress Notes (Signed)
PCP: Elby Showers, MD Primary Cardiologist:  Wendy Rose is a 69 y.o. female who presents today for routine electrophysiology followup. She has h/o PPM 02/2013 for bradycardia. Some high rate atrial episodes have been noted since implantation of device, but she was called to the office, for constant aflutter since 10/17 from which she is asymptomatic. Of note, pt has mitochrondia myopathy and walks with a cane, prone to falls. Has had two minor falls since last visit. Will continue to monitor pts fall risk , she understands if it escalates may have to stop blood thinner  Has a  CHA2DS2-VASc Score and unadjusted Ischemic Stroke Rate (% per year) is equal to2.2 % stroke rate/year from a score of 2 for age and being female.Crcl calculated at 96.30ml/min.No bleeding risks.Currently is tolerating eliquis. Has had two minor falls since last visit, did not hit head.. Will continue to monitor pts fall risk , she understands if falls escalate, drug becomes higher risk than benefit.  She also understands if she falls and hits head, she is to report to the ER.  Interrogation of device reveals SR today. Aflutter burden at 14%. Again, she  Is asymptomatic and  unaware when she goes into aflutter and feels no different bing in SR.Marland Kitchen   Today, she denies symptoms of palpitations, chest pain, shortness of breath,  lower extremity edema, dizziness, presyncope, or syncope. No bleeding issues with NOAC. The patient is otherwise without complaint today.   Past Medical History  Diagnosis Date  . Fibromyalgia   . MVP (mitral valve prolapse)   . Vertigo   . Anemia   . Anxiety   . Symptomatic bradycardia     s/p Biotronik Evia dual chamber pacemaker implant 02/16/2013 by Wendy Rayann Heman  . Arthritis   . Chronic headaches   . Depression   . GERD (gastroesophageal reflux disease)   . Hyperlipemia   . IBS (irritable bowel syndrome)   . Pneumonia   . UTI (lower urinary tract infection)   . PONV (postoperative nausea  and vomiting)   . S/P cardiac pacemaker procedure, 02/16/13, Bio tronik device 02/19/2013   Past Surgical History  Procedure Laterality Date  . Tonsillectomy and adenoidectomy    . Tympanoplasty    . Dilation and curettage of uterus    . Total abdominal hysterectomy    . Bladder suspension    . Cesarean section    . Pacemaker insertion  02/16/2013    Biotronik Evia dual chamber pacemaker implanted by Wendy Rayann Heman  . Permanent pacemaker insertion N/A 02/16/2013    Procedure: PERMANENT PACEMAKER INSERTION;  Surgeon: Thompson Grayer, MD;  Location[facility] C[facility] LAB;  Service: Cardiovascular;  Laterality: N/A;    Current Outpatient Prescriptions  Medication Sig Dispense Refill  . ALPRAZolam (XANAX) 1 MG tablet Take 1/2 tablet in the morning, take 1/2 tablet in evening and 1 at bedtime. (Patient taking differently: Take 1/2 tablet by mouth in the morning, take 1/2 tablet by mouth in evening and 1 tablet by mouth at bedtime.) 60 tablet 3  . apixaban (ELIQUIS) 5 MG TABS tablet Take 1 tablet (5 mg total) by mouth 2 (two) times daily. 60 tablet 3  . atorvastatin (LIPITOR) 20 MG tablet Take 1 tablet (20 mg total) by mouth daily. 30 tablet 6  . B Complex-C (B-COMPLEX WITH VITAMIN C) tablet Take 1 tablet by mouth daily.    Marland Kitchen buPROPion (WELLBUTRIN XL) 150 MG 24 hr tablet take 1 tablet by mouth every morning 30  tablet 11  . calcium-vitamin D (CALCIUM 500+D) 500-200 MG-UNIT per tablet Take 1 tablet by mouth 2 (two) times daily.     Marland Kitchen co-enzyme Q-10 50 MG capsule Take 50 mg by mouth 2 (two) times daily.    . Creatinine POWD Take by mouth as directed.     Marland Kitchen FLUoxetine (PROZAC) 40 MG capsule Take 1 capsule (40 mg total) by mouth daily. 30 capsule 11  . HYDROcodone-acetaminophen (NORCO) 10-325 MG per tablet Take 1 tablet by mouth every 6 (six) hours as needed. 90 tablet 0  . hydrocortisone 2.5 % cream Apply topically 2 (two) times daily. 30 g 1  . lidocaine (LIDODERM) 5 % Place 1 patch onto the skin daily as needed  (pain).     . Magnesium Oxide (MAG-200 PO) Take 1 tablet by mouth 2 (two) times daily.     . pantoprazole (PROTONIX) 40 MG tablet take 1 tablet by mouth twice a day 60 tablet 3  . traMADol (ULTRAM) 50 MG tablet take 1 to 2 tablets by mouth twice a day if needed 120 tablet 0   Current Facility-Administered Medications  Medication Dose Route Frequency Provider Last Rate Last Dose  . Tdap (BOOSTRIX) injection 0.5 mL  0.5 mL Intramuscular Once Elby Showers, MD        Physical Exam: Filed Vitals:   10/16/14 1459  BP: 138/80  Pulse: 60  Height: 5\' 11"  (1.803 m)  Weight: 165 lb 6.4 oz (75.025 kg)    GEN- The patient is well appearing, alert and oriented x 3 today.   Head- normocephalic, atraumatic Eyes-  Sclera clear, conjunctiva pink Ears- hearing intact Oropharynx- clear Lungs- Clear to ausculation bilaterally, normal work of breathing Chest- pacemaker pocket is well healed Heart- Regular rate and rhythm, no murmurs, rubs or gallops, PMI not laterally displaced GI- soft, NT, ND, + BS Extremities- no clubbing, cyanosis, or edema  Pacemaker interrogation- reviewed in detail today,  See PACEART report. In SR today. EKG- paced rhythm, LAD.  Assessment and Plan:  1. Sinus bradycardia/ sick sinus syndrome Normal pacemaker function See Pace Art report No changes today  2. Asymptomatic atypical aflutter/stroke risk Prior ekg reveals atypical but possibly isthmus dependant atrial flutter Continue  Eliquis 5 mg bid(crcl 96.5 prior visit) Bmet today to reassess renal function .Fall precaution with use of cane. Encouraged to look into a seated walker.  If risks of falls increases then we may have to stop anticoagulation at some point.  She is clear that she would like to avoid ablation at this time. F/u Wendy. Rayann Heman in 6 months with device check,sooner if needed.Marland Kitchen

## 2014-10-16 NOTE — Progress Notes (Signed)
Pacemaker check in clinic w/Donna Kayleen Memos, NP. Normal device function. Thresholds, sensing, impedances consistent with previous measurements. Device programmed to maximize longevity. 20 mode switches (14%)---max dur. 5 days (2/23), Max V 120 + Eliquis. No high ventricular rates noted. Device programmed at appropriate safety margins. Histogram distribution appropriate for patient activity level. Device programmed to optimize intrinsic conduction. Estimated longevity 9years 5 months. Patient will follow up via [facility] on 6-9 and with JA in 6 months.

## 2014-10-17 ENCOUNTER — Telehealth: Payer: Self-pay | Admitting: *Deleted

## 2014-10-17 NOTE — Telephone Encounter (Signed)
-----   Message from Sherran Needs, NP sent at 10/17/2014  8:40 AM EST ----- Please inform normal results.

## 2014-10-17 NOTE — Telephone Encounter (Signed)
Pt notified of normal lab results.

## 2014-10-24 ENCOUNTER — Encounter: Payer: Self-pay | Admitting: Internal Medicine

## 2014-11-13 ENCOUNTER — Other Ambulatory Visit: Payer: Self-pay | Admitting: Internal Medicine

## 2014-11-13 NOTE — Telephone Encounter (Signed)
Please refill each x 6 months

## 2014-11-13 NOTE — Telephone Encounter (Signed)
Tramadol and xanax refilled

## 2014-11-20 ENCOUNTER — Ambulatory Visit (INDEPENDENT_AMBULATORY_CARE_PROVIDER_SITE_OTHER): Payer: Medicare Other | Admitting: Internal Medicine

## 2014-11-20 ENCOUNTER — Encounter: Payer: Self-pay | Admitting: Internal Medicine

## 2014-11-20 VITALS — BP 130/64 | HR 95 | Temp 97.3°F | Wt 167.0 lb

## 2014-11-20 DIAGNOSIS — G43901 Migraine, unspecified, not intractable, with status migrainosus: Secondary | ICD-10-CM

## 2014-11-20 DIAGNOSIS — G43801 Other migraine, not intractable, with status migrainosus: Secondary | ICD-10-CM

## 2014-11-20 DIAGNOSIS — Z7901 Long term (current) use of anticoagulants: Secondary | ICD-10-CM

## 2014-11-20 MED ORDER — PREDNISONE 10 MG PO KIT: PACK | ORAL | Status: DC

## 2014-11-20 MED ORDER — OXYCODONE-ACETAMINOPHEN 5-500 MG PO CAPS: 1.0000 | ORAL_CAPSULE | Freq: Four times a day (QID) | ORAL | Status: DC | PRN

## 2014-11-20 MED ORDER — ONDANSETRON HCL 4 MG PO TABS: 4.0000 mg | ORAL_TABLET | Freq: Three times a day (TID) | ORAL | Status: DC | PRN

## 2014-11-20 NOTE — Patient Instructions (Signed)
Take prednisone as directed. Take Oxycodone APAP as directed. Have CT tomorrow. Take Zofran if needed for nausea.

## 2014-11-20 NOTE — Progress Notes (Signed)
   Subjective:    Patient ID: Wendy Rose, female    DOB: 03/27/1946, 69 y.o.   MRN: [id number]  HPI  Patient had onset of headache on Friday, April 8. Subsequently had to take her husband to the hospital and spent several hours there. He was not admitted. She was worn out. Initially thought headache wasn't too bad. However has continued throughout the weekend and into the first part of the week. Has felt slightly nauseated at times but no vomiting. Headache initially was frontal in nature but has spread to parietal and occipital regions. She does have a remote history of migraine headaches. She tried taking hydrocodone/APAP but it did not seem to help very much. She is disturbed that the headache is continued for several days. She has no numbness or tingling. No weakness in her extremities. She is on Eliquis for chronic anticoagulation. She has not fallen or struck her head. No vertigo.    Review of Systems     Objective:   Physical Exam  Skin warm and dry. Nodes none. TMs are clear. Pharynx is clear. Fundi are benign. She has a cataract in the right. Extraocular movements are full. No dystaxia was. Cranial nerves II through XII grossly intact. Deep tendon reflexes 2+ and symmetrical. Muscle strength is within normal limits except for quadriceps weakness which is previously been noted due to myopathy. Gait is stable.      Assessment & Plan:  Status migrainosus  Rule out CNS bleed such as subdural hematoma since she is on Eliquis  Plan: She is to have CT without contrast. Was given Sterapred DS 10 mg 6 day dosepak. Oxycodone APAP 5/325 every 8 hours when necessary pain. Take with food. Zofran 4 mg tablets as needed for nausea.  25 minutes spent with patient

## 2014-11-21 ENCOUNTER — Telehealth: Payer: Self-pay | Admitting: *Deleted

## 2014-11-21 ENCOUNTER — Other Ambulatory Visit: Payer: Self-pay | Admitting: Internal Medicine

## 2014-11-21 ENCOUNTER — Ambulatory Visit
Admission: RE | Admit: 2014-11-21 | Discharge: 2014-11-21 | Disposition: A | Discharge status: Home / Self Care | Payer: Medicare Other | Source: Ambulatory Visit | Attending: Internal Medicine | Admitting: Internal Medicine

## 2014-11-21 DIAGNOSIS — G43901 Migraine, unspecified, not intractable, with status migrainosus: Secondary | ICD-10-CM

## 2014-11-21 DIAGNOSIS — R51 Headache: Secondary | ICD-10-CM | POA: Diagnosis not present

## 2014-11-21 MED ORDER — AMOXICILLIN 500 MG PO CAPS: 500.0000 mg | ORAL_CAPSULE | Freq: Three times a day (TID) | ORAL | Status: DC

## 2014-11-21 NOTE — Telephone Encounter (Signed)
Notified patient with CT results. Patient states she has been able to take amoxicillin without complication in past per Dr Renold Genta script for amoxicillin sent to patient pharmacy for otitis media.

## 2014-11-28 ENCOUNTER — Telehealth: Payer: Self-pay | Admitting: *Deleted

## 2014-11-28 NOTE — Telephone Encounter (Signed)
Spoke with patient regarding CT results . She states her ENT is Dr Erik Obey we will fax information to his office . Patient will call to schedule appt

## 2014-12-03 DIAGNOSIS — K219 Gastro-esophageal reflux disease without esophagitis: Secondary | ICD-10-CM | POA: Diagnosis not present

## 2014-12-03 DIAGNOSIS — J309 Allergic rhinitis, unspecified: Secondary | ICD-10-CM | POA: Diagnosis not present

## 2014-12-03 DIAGNOSIS — H6983 Other specified disorders of Eustachian tube, bilateral: Secondary | ICD-10-CM | POA: Diagnosis not present

## 2014-12-03 DIAGNOSIS — H9072 Mixed conductive and sensorineural hearing loss, unilateral, left ear, with unrestricted hearing on the contralateral side: Secondary | ICD-10-CM | POA: Diagnosis not present

## 2014-12-03 DIAGNOSIS — H9041 Sensorineural hearing loss, unilateral, right ear, with unrestricted hearing on the contralateral side: Secondary | ICD-10-CM | POA: Diagnosis not present

## 2014-12-13 DIAGNOSIS — H35373 Puckering of macula, bilateral: Secondary | ICD-10-CM | POA: Diagnosis not present

## 2014-12-13 DIAGNOSIS — H35033 Hypertensive retinopathy, bilateral: Secondary | ICD-10-CM | POA: Diagnosis not present

## 2014-12-13 DIAGNOSIS — H2513 Age-related nuclear cataract, bilateral: Secondary | ICD-10-CM | POA: Diagnosis not present

## 2014-12-13 DIAGNOSIS — H524 Presbyopia: Secondary | ICD-10-CM | POA: Diagnosis not present

## 2014-12-17 ENCOUNTER — Ambulatory Visit (INDEPENDENT_AMBULATORY_CARE_PROVIDER_SITE_OTHER): Payer: Medicare Other | Admitting: Ophthalmology

## 2014-12-26 ENCOUNTER — Other Ambulatory Visit: Payer: Self-pay | Admitting: Internal Medicine

## 2014-12-27 ENCOUNTER — Ambulatory Visit (INDEPENDENT_AMBULATORY_CARE_PROVIDER_SITE_OTHER): Payer: Medicare Other | Admitting: Ophthalmology

## 2014-12-27 DIAGNOSIS — H2513 Age-related nuclear cataract, bilateral: Secondary | ICD-10-CM | POA: Diagnosis not present

## 2014-12-27 DIAGNOSIS — H43813 Vitreous degeneration, bilateral: Secondary | ICD-10-CM

## 2014-12-27 DIAGNOSIS — H35373 Puckering of macula, bilateral: Secondary | ICD-10-CM | POA: Diagnosis not present

## 2015-01-08 DIAGNOSIS — G713 Mitochondrial myopathy, not elsewhere classified: Secondary | ICD-10-CM | POA: Diagnosis not present

## 2015-01-08 DIAGNOSIS — G609 Hereditary and idiopathic neuropathy, unspecified: Secondary | ICD-10-CM | POA: Diagnosis not present

## 2015-01-09 ENCOUNTER — Other Ambulatory Visit: Payer: Self-pay

## 2015-01-09 MED ORDER — APIXABAN 5 MG PO TABS: 5.0000 mg | ORAL_TABLET | Freq: Two times a day (BID) | ORAL | Status: DC

## 2015-01-16 ENCOUNTER — Encounter (HOSPITAL_COMMUNITY): Payer: Self-pay | Admitting: Emergency Medicine

## 2015-01-16 ENCOUNTER — Emergency Department (HOSPITAL_COMMUNITY): Payer: Medicare Other

## 2015-01-16 ENCOUNTER — Emergency Department (HOSPITAL_COMMUNITY)
Admission: EM | Admit: 2015-01-16 | Discharge: 2015-01-17 | Disposition: A | Discharge status: Home / Self Care | Payer: Medicare Other | Attending: Emergency Medicine | Admitting: Emergency Medicine

## 2015-01-16 DIAGNOSIS — F329 Major depressive disorder, single episode, unspecified: Secondary | ICD-10-CM | POA: Diagnosis not present

## 2015-01-16 DIAGNOSIS — F419 Anxiety disorder, unspecified: Secondary | ICD-10-CM | POA: Insufficient documentation

## 2015-01-16 DIAGNOSIS — M1990 Unspecified osteoarthritis, unspecified site: Secondary | ICD-10-CM | POA: Insufficient documentation

## 2015-01-16 DIAGNOSIS — E785 Hyperlipidemia, unspecified: Secondary | ICD-10-CM | POA: Diagnosis not present

## 2015-01-16 DIAGNOSIS — D649 Anemia, unspecified: Secondary | ICD-10-CM | POA: Diagnosis not present

## 2015-01-16 DIAGNOSIS — M797 Fibromyalgia: Secondary | ICD-10-CM | POA: Diagnosis not present

## 2015-01-16 DIAGNOSIS — S0990XA Unspecified injury of head, initial encounter: Secondary | ICD-10-CM | POA: Diagnosis not present

## 2015-01-16 DIAGNOSIS — R42 Dizziness and giddiness: Secondary | ICD-10-CM

## 2015-01-16 DIAGNOSIS — K219 Gastro-esophageal reflux disease without esophagitis: Secondary | ICD-10-CM | POA: Diagnosis not present

## 2015-01-16 DIAGNOSIS — R079 Chest pain, unspecified: Secondary | ICD-10-CM | POA: Diagnosis not present

## 2015-01-16 DIAGNOSIS — R5383 Other fatigue: Secondary | ICD-10-CM | POA: Diagnosis not present

## 2015-01-16 DIAGNOSIS — Z79899 Other long term (current) drug therapy: Secondary | ICD-10-CM | POA: Diagnosis not present

## 2015-01-16 DIAGNOSIS — Z8701 Personal history of pneumonia (recurrent): Secondary | ICD-10-CM | POA: Insufficient documentation

## 2015-01-16 DIAGNOSIS — Z950 Presence of cardiac pacemaker: Secondary | ICD-10-CM | POA: Diagnosis not present

## 2015-01-16 DIAGNOSIS — Z87440 Personal history of urinary (tract) infections: Secondary | ICD-10-CM | POA: Diagnosis not present

## 2015-01-16 DIAGNOSIS — Z7951 Long term (current) use of inhaled steroids: Secondary | ICD-10-CM | POA: Insufficient documentation

## 2015-01-16 DIAGNOSIS — R072 Precordial pain: Secondary | ICD-10-CM | POA: Diagnosis not present

## 2015-01-16 DIAGNOSIS — G8929 Other chronic pain: Secondary | ICD-10-CM | POA: Diagnosis not present

## 2015-01-16 DIAGNOSIS — Z7902 Long term (current) use of antithrombotics/antiplatelets: Secondary | ICD-10-CM | POA: Insufficient documentation

## 2015-01-16 LAB — CBC
HEMATOCRIT: 37.8 % (ref 36.0–46.0)
Hemoglobin: 12.7 g/dL (ref 12.0–15.0)
MCH: 28.5 pg (ref 26.0–34.0)
MCHC: 33.6 g/dL (ref 30.0–36.0)
MCV: 84.9 fL (ref 78.0–100.0)
Platelets: 214 10*3/uL (ref 150–400)
RBC: 4.45 MIL/uL (ref 3.87–5.11)
RDW: 13.4 % (ref 11.5–15.5)
WBC: 6.3 10*3/uL (ref 4.0–10.5)

## 2015-01-16 LAB — BASIC METABOLIC PANEL
ANION GAP: 8 (ref 5–15)
BUN: 22 mg/dL — ABNORMAL HIGH (ref 6–20)
CHLORIDE: 101 mmol/L (ref 101–111)
CO2: 25 mmol/L (ref 22–32)
Calcium: 8.8 mg/dL — ABNORMAL LOW (ref 8.9–10.3)
Creatinine, Ser: 0.81 mg/dL (ref 0.44–1.00)
GFR calc Af Amer: >60 mL/min (ref >60)
Glucose, Bld: 125 mg/dL — ABNORMAL HIGH (ref 65–99)
POTASSIUM: 4.7 mmol/L (ref 3.5–5.1)
SODIUM: 134 mmol/L — AB (ref 135–145)

## 2015-01-16 LAB — I-STAT TROPONIN, ED: Troponin i, poc: 0.01 ng/mL (ref 0.00–0.08)

## 2015-01-16 LAB — BRAIN NATRIURETIC PEPTIDE: B NATRIURETIC PEPTIDE 5: 28.3 pg/mL (ref 0.0–100.0)

## 2015-01-16 NOTE — ED Notes (Signed)
C/o dizziness, bilateral arm weakness, and nagging pain to posterior neck since yesterday.  Also reports sharp chest pain that woke her up at 7:30am.  States pain radiated to back and only lasted a few minutes. C/o sob and nausea.

## 2015-01-17 ENCOUNTER — Emergency Department (HOSPITAL_COMMUNITY): Payer: Medicare Other

## 2015-01-17 ENCOUNTER — Encounter (HOSPITAL_COMMUNITY): Payer: Self-pay

## 2015-01-17 DIAGNOSIS — R42 Dizziness and giddiness: Secondary | ICD-10-CM | POA: Diagnosis not present

## 2015-01-17 DIAGNOSIS — S0990XA Unspecified injury of head, initial encounter: Secondary | ICD-10-CM | POA: Diagnosis not present

## 2015-01-17 LAB — URINALYSIS, ROUTINE W REFLEX MICROSCOPIC
Bilirubin Urine: NEGATIVE
Glucose, UA: NEGATIVE mg/dL
Hgb urine dipstick: NEGATIVE
Ketones, ur: NEGATIVE mg/dL
Leukocytes, UA: NEGATIVE
Nitrite: NEGATIVE
PROTEIN: NEGATIVE mg/dL
Specific Gravity, Urine: 1.009 (ref 1.005–1.030)
Urobilinogen, UA: 0.2 mg/dL (ref 0.0–1.0)
pH: 8 (ref 5.0–8.0)

## 2015-01-17 LAB — I-STAT TROPONIN, ED: Troponin i, poc: 0.01 ng/mL (ref 0.00–0.08)

## 2015-01-17 MED ORDER — SODIUM CHLORIDE 0.9 % IV BOLUS (SEPSIS)
1000.0000 mL | Freq: Once | INTRAVENOUS | Status: AC
  Administered 2015-01-17: 1000 mL via INTRAVENOUS

## 2015-01-17 NOTE — ED Provider Notes (Signed)
CSN: [id number]     Arrival date & time 01/16/15  2027 History   First MD Initiated Contact with Patient 01/16/15 2320     Chief Complaint  Patient presents with  . Chest Pain  . Dizziness     (Consider location/radiation/quality/duration/timing/severity/associated sxs/prior Treatment) Patient is a 69 y.o. female presenting with chest pain.  Chest Pain Pain location:  Substernal area Pain quality: sharp   Pain radiates to:  Does not radiate Pain radiates to the back: no   Pain severity:  Severe Onset quality:  Sudden Duration:  2 minutes Timing:  Constant Progression:  Unchanged Chronicity:  New Context: at rest   Context: not breathing   Context comment:  Ho mitochondrial dysfunction Relieved by:  Nothing Worsened by:  Nothing tried Ineffective treatments:  None tried Associated symptoms: fatigue   Associated symptoms: no abdominal pain     Past Medical History  Diagnosis Date  . Fibromyalgia   . MVP (mitral valve prolapse)   . Vertigo   . Anemia   . Anxiety   . Symptomatic bradycardia     s/p Biotronik Evia dual chamber pacemaker implant 02/16/2013 by Dr Rayann Heman  . Arthritis   . Chronic headaches   . Depression   . GERD (gastroesophageal reflux disease)   . Hyperlipemia   . IBS (irritable bowel syndrome)   . Pneumonia   . UTI (lower urinary tract infection)   . PONV (postoperative nausea and vomiting)   . S/P cardiac pacemaker procedure, 02/16/13, Bio tronik device 02/19/2013   Past Surgical History  Procedure Laterality Date  . Tonsillectomy and adenoidectomy    . Tympanoplasty    . Dilation and curettage of uterus    . Total abdominal hysterectomy    . Bladder suspension    . Cesarean section    . Pacemaker insertion  02/16/2013    Biotronik Evia dual chamber pacemaker implanted by Dr Rayann Heman  . Permanent pacemaker insertion N/A 02/16/2013    Procedure: PERMANENT PACEMAKER INSERTION;  Surgeon: Thompson Grayer, MD;  Location: [facility] CATH LAB;  Service:  Cardiovascular;  Laterality: N/A;   Family History  Problem Relation Age of Onset  . Hypertension Mother   . Heart disease Father   . Diabetes Sister   . Heart disease Brother   . Colon cancer Neg Hx   . Esophageal cancer Neg Hx   . Stomach cancer Neg Hx   . Rectal cancer Neg Hx    History  Substance Use Topics  . Smoking status: Never Smoker   . Smokeless tobacco: Never Used  . Alcohol Use: No   OB History    No data available     Review of Systems  Constitutional: Positive for fatigue.  Cardiovascular: Positive for chest pain.  Gastrointestinal: Negative for abdominal pain.  All other systems reviewed and are negative.     Allergies  Contrast media; Ciprofloxacin; Cefuroxime axetil; Codeine; Naproxen; Nitrofurantoin; Sulfonamide derivatives; and Sulindac  Home Medications   Prior to Admission medications   Medication Sig Start Date End Date Taking? Authorizing Provider  ALPRAZolam Duanne Moron) 1 MG tablet take 1/2 tablets by mouth every morning 1/2 tablets by mouth every evening then 1 at bedtime 11/13/14  Yes Elby Showers, MD  apixaban (ELIQUIS) 5 MG TABS tablet Take 1 tablet (5 mg total) by mouth 2 (two) times daily. 01/09/15  Yes Deboraha Sprang, MD  atorvastatin (LIPITOR) 20 MG tablet Take 1 tablet (20 mg total) by mouth daily. 07/24/14  Yes Marcello Moores  Floyce Stakes, MD  B Complex-C (B-COMPLEX WITH VITAMIN C) tablet Take 1 tablet by mouth daily.   Yes Historical Provider, MD  buPROPion (WELLBUTRIN XL) 150 MG 24 hr tablet take 1 tablet by mouth every morning 07/09/14  Yes Elby Showers, MD  calcium-vitamin D (CALCIUM 500+D) 500-200 MG-UNIT per tablet Take 1 tablet by mouth 2 (two) times daily.    Yes Historical Provider, MD  co-enzyme Q-10 50 MG capsule Take 50 mg by mouth 2 (two) times daily.   Yes Historical Provider, MD  Creatinine POWD Take by mouth as directed.  08/11/12  Yes Historical Provider, MD  FLUoxetine (PROZAC) 40 MG capsule Take 1 capsule (40 mg total) by mouth daily.  03/29/14  Yes Elby Showers, MD  fluticasone (FLONASE) 50 MCG/ACT nasal spray Place 1 spray into both nostrils daily as needed for allergies or rhinitis.   Yes Historical Provider, MD  HYDROcodone-acetaminophen (NORCO) 10-325 MG per tablet Take 1 tablet by mouth every 6 (six) hours as needed. 05/14/14  Yes Elby Showers, MD  lidocaine (LIDODERM) 5 % Place 1 patch onto the skin daily as needed (pain).  03/30/13  Yes Historical Provider, MD  Magnesium Oxide (MAG-200 PO) Take 1 tablet by mouth 2 (two) times daily.    Yes Historical Provider, MD  ondansetron (ZOFRAN) 4 MG tablet Take 1 tablet (4 mg total) by mouth every 8 (eight) hours as needed for nausea or vomiting. 11/20/14  Yes Elby Showers, MD  oxyCODONE-acetaminophen (TYLOX) 5-500 MG per capsule Take 1 capsule by mouth every 6 (six) hours as needed for pain. 11/20/14  Yes Elby Showers, MD  pantoprazole (PROTONIX) 40 MG tablet take 1 tablet by mouth twice a day 12/27/14  Yes Irene Shipper, MD  traMADol Veatrice Bourbon) 50 MG tablet take 1 to 2 tablets by mouth twice a day if needed 11/13/14  Yes Elby Showers, MD  amoxicillin (AMOXIL) 500 MG capsule Take 1 capsule (500 mg total) by mouth 3 (three) times daily. Patient not taking: Reported on 01/17/2015 11/21/14   Elby Showers, MD  hydrocortisone 2.5 % cream Apply topically 2 (two) times daily. Patient not taking: Reported on 01/17/2015 04/03/13   Elby Showers, MD  PredniSONE 10 MG KIT Take in tapering course as directed 6-5-4-3-2-1 taper Patient not taking: Reported on 01/17/2015 11/20/14   Elby Showers, MD   BP 106/62 mmHg  Pulse 65  Temp(Src) 97.6 F (36.4 C) (Oral)  Resp 15  SpO2 100% Physical Exam  Constitutional: She is oriented to person, place, and time. She appears well-developed and well-nourished.  HENT:  Head: Normocephalic and atraumatic.  Right Ear: External ear normal.  Left Ear: External ear normal.  Eyes: Conjunctivae and EOM are normal. Pupils are equal, round, and reactive to light.   Neck: Normal range of motion. Neck supple.  Cardiovascular: Normal rate, regular rhythm, normal heart sounds and intact distal pulses.   Pulmonary/Chest: Effort normal and breath sounds normal.  Abdominal: Soft. Bowel sounds are normal. There is no tenderness.  Musculoskeletal: Normal range of motion.  Neurological: She is alert and oriented to person, place, and time.  Skin: Skin is warm and dry.  Vitals reviewed.   ED Course  Procedures (including critical care time) Labs Review Labs Reviewed  BASIC METABOLIC PANEL - Abnormal; Notable for the following:    Sodium 134 (*)    Glucose, Bld 125 (*)    BUN 22 (*)    Calcium 8.8 (*)  All other components within normal limits  CBC  BRAIN NATRIURETIC PEPTIDE  URINALYSIS, ROUTINE W REFLEX MICROSCOPIC (NOT AT [facility])  Randolm Idol, ED  Randolm Idol, ED    Imaging Review Dg Chest 2 View  01/16/2015   CLINICAL DATA:  Chest pain.  Recurrent dizzy spells  EXAM: CHEST  2 VIEW  COMPARISON:  02/17/2013  FINDINGS: Left-sided pacemaker overlies normal cardiac silhouette. No effusion, infiltrate, or pneumothorax. No acute osseous abnormality.  IMPRESSION: No acute cardiopulmonary process.   Electronically Signed   By: Suzy Bouchard M.D.   On: 01/16/2015 21:52   Ct Head Wo Contrast  01/17/2015   CLINICAL DATA:  Dizziness. BILATERAL arm weakness, and neck pain. Recent fall. History of anticoagulation.  EXAM: CT HEAD WITHOUT CONTRAST  TECHNIQUE: Contiguous axial images were obtained from the base of the skull through the vertex without intravenous contrast.  COMPARISON:  11/21/2014.  FINDINGS: Generalized atrophy, with prominence of the frontal and temporal CSF spaces. Slight white matter hypoattenuation, likely small vessel disease. No acute stroke or hemorrhage. No intracranial mass lesion or posttraumatic extra-axial fluid collection. Calvarium intact. LEFT mastoid fluid persists but is improved when compared with April. No sinus air-fluid  level or RIGHT mastoid fluid. Negative orbits.  IMPRESSION: Frontotemporal atrophy. No acute intracranial findings. No posttraumatic sequelae. Improved LEFT mastoid disease.   Electronically Signed   By: Rolla Flatten M.D.   On: 01/17/2015 01:15     EKG Interpretation   Date/Time:  Wednesday January 16 2015 20:37:25 EDT Ventricular Rate:  79 PR Interval:  204 QRS Duration: 140 QT Interval:  438 QTC Calculation: 502 R Axis:   -91 Text Interpretation:   Suspect arm lead reversal, interpretation  assumes no reversal Atrial-paced rhythm Non-specific intra-ventricular  conduction block Right ventricular hypertrophy Inferior infarct , age  undetermined Anterolateral infarct , age undetermined Abnormal ECG No  significant change since last tracing Confirmed by Debby Freiberg [phone])  on 01/16/2015 11:58:48 PM      MDM   Final diagnoses:  Chest pain, unspecified chest pain type  Lightheadedness    69 y.o. female with pertinent PMH of fibromyalgia, mitochondrial dysfunction presents with chest pain, fatigue as above.  Pain brief, self limited, occurred this am.  Had another brief run later in the day.  Physical exam on arrival as above.  No symptoms at time of my exam.    Elba Barman as above unremarkable, including delta trop.  Pt still asymptomatic.  Suspect dehydration mediated lightheadedness.  DC home in stable condition to fu with pcp and cardiology.  I have reviewed all laboratory and imaging studies if ordered as above  1. Chest pain, unspecified chest pain type   2. Lightheadedness         Debby Freiberg, MD 01/18/15 [phone]

## 2015-01-17 NOTE — ED Notes (Signed)
Pt stated she needed to urinate; pts BP assessed when pt stood from bed; pt orthostatic positive and reported continued dizziness; pt able to use bedside commode to eliminate; pt put back in bed; will continue to monitor

## 2015-01-17 NOTE — Discharge Instructions (Signed)
°Chest Pain (Nonspecific) °It is often hard to give a specific diagnosis for the cause of chest pain. There is always a chance that your pain could be related to something serious, such as a heart attack or a blood clot in the lungs. You need to follow up with your health care provider for further evaluation. °CAUSES  °· Heartburn. °· Pneumonia or bronchitis. °· Anxiety or stress. °· Inflammation around your heart (pericarditis) or lung (pleuritis or pleurisy). °· A blood clot in the lung. °· A collapsed lung (pneumothorax). It can develop suddenly on its own (spontaneous pneumothorax) or from trauma to the chest. °· Shingles infection (herpes zoster virus). °The chest wall is composed of bones, muscles, and cartilage. Any of these can be the source of the pain. °· The bones can be bruised by injury. °· The muscles or cartilage can be strained by coughing or overwork. °· The cartilage can be affected by inflammation and become sore (costochondritis). °DIAGNOSIS  °Lab tests or other studies may be needed to find the cause of your pain. Your health care provider may have you take a test called an ambulatory electrocardiogram (ECG). An ECG records your heartbeat patterns over a 24-hour period. You may also have other tests, such as: °· Transthoracic echocardiogram (TTE). During echocardiography, sound waves are used to evaluate how blood flows through your heart. °· Transesophageal echocardiogram (TEE). °· Cardiac monitoring. This allows your health care provider to monitor your heart rate and rhythm in real time. °· Holter monitor. This is a portable device that records your heartbeat and can help diagnose heart arrhythmias. It allows your health care provider to track your heart activity for several days, if needed. °· Stress tests by exercise or by giving medicine that makes the heart beat faster. °TREATMENT  °· Treatment depends on what may be causing your chest pain. Treatment may include: °· Acid blockers for  heartburn. °· Anti-inflammatory medicine. °· Pain medicine for inflammatory conditions. °· Antibiotics if an infection is present. °· You may be advised to change lifestyle habits. This includes stopping smoking and avoiding alcohol, caffeine, and chocolate. °· You may be advised to keep your head raised (elevated) when sleeping. This reduces the chance of acid going backward from your stomach into your esophagus. °Most of the time, nonspecific chest pain will improve within 2-3 days with rest and mild pain medicine.  °HOME CARE INSTRUCTIONS  °· If antibiotics were prescribed, take them as directed. Finish them even if you start to feel better. °· For the next few days, avoid physical activities that bring on chest pain. Continue physical activities as directed. °· Do not use any tobacco products, including cigarettes, chewing tobacco, or electronic cigarettes. °· Avoid drinking alcohol. °· Only take medicine as directed by your health care provider. °· Follow your health care provider's suggestions for further testing if your chest pain does not go away. °· Keep any follow-up appointments you made. If you do not go to an appointment, you could develop lasting (chronic) problems with pain. If there is any problem keeping an appointment, call to reschedule. °SEEK MEDICAL CARE IF:  °· Your chest pain does not go away, even after treatment. °· You have a rash with blisters on your chest. °· You have a fever. °SEEK IMMEDIATE MEDICAL CARE IF:  °· You have increased chest pain or pain that spreads to your arm, neck, jaw, back, or abdomen. °· You have shortness of breath. °· You have an increasing cough, or you cough   up blood. °· You have severe back or abdominal pain. °· You feel nauseous or vomit. °· You have severe weakness. °· You faint. °· You have chills. °This is an emergency. Do not wait to see if the pain will go away. Get medical help at once. Call your local emergency services (911 in [place] Do not drive  yourself to the hospital. °MAKE SURE YOU:  °· Understand these instructions. °· Will watch your condition. °· Will get help right away if you are not doing well or get worse. °Document Released: 05/06/2005 Document Revised: 08/01/2013 Document Reviewed: 03/01/2008 °Exit[employer]® Patient Information ©2015 [employer]. This information is not intended to replace advice given to you by your health care provider. Make sure you discuss any questions you have with your health care provider. ° ° °Dizziness °Dizziness is a common problem. It is a feeling of unsteadiness or light-headedness. You may feel like you are about to faint. Dizziness can lead to injury if you stumble or fall. A person of any age group can suffer from dizziness, but dizziness is more common in older adults. °CAUSES  °Dizziness can be caused by many different things, including: °· Middle ear problems. °· Standing for too long. °· Infections. °· An allergic reaction. °· Aging. °· An emotional response to something, such as the sight of blood. °· Side effects of medicines. °· Tiredness. °· Problems with circulation or blood pressure. °· Excessive use of alcohol or medicines, or illegal drug use. °· Breathing too fast (hyperventilation). °· An irregular heart rhythm (arrhythmia). °· A low red blood cell count (anemia). °· Pregnancy. °· Vomiting, diarrhea, fever, or other illnesses that cause body fluid loss (dehydration). °· Diseases or conditions such as Parkinson's disease, high blood pressure (hypertension), diabetes, and thyroid problems. °· Exposure to extreme heat. °DIAGNOSIS  °Your health care provider will ask about your symptoms, perform a physical exam, and perform an electrocardiogram (ECG) to record the electrical activity of your heart. Your health care provider may also perform other heart or blood tests to determine the cause of your dizziness. These may include: °· Transthoracic echocardiogram (TTE). During echocardiography, sound waves are  used to evaluate how blood flows through your heart. °· Transesophageal echocardiogram (TEE). °· Cardiac monitoring. This allows your health care provider to monitor your heart rate and rhythm in real time. °· Holter monitor. This is a portable device that records your heartbeat and can help diagnose heart arrhythmias. It allows your health care provider to track your heart activity for several days if needed. °· Stress tests by exercise or by giving medicine that makes the heart beat faster. °TREATMENT  °Treatment of dizziness depends on the cause of your symptoms and can vary greatly. °HOME CARE INSTRUCTIONS  °· Drink enough fluids to keep your urine clear or pale yellow. This is especially important in very hot weather. In older adults, it is also important in cold weather. °· Take your medicine exactly as directed if your dizziness is caused by medicines. When taking blood pressure medicines, it is especially important to get up slowly. °¨ Rise slowly from chairs and steady yourself until you feel okay. °¨ In the morning, first sit up on the side of the bed. When you feel okay, stand slowly while holding onto something until you know your balance is fine. °· Move your legs often if you need to stand in one place for a long time. Tighten and relax your muscles in your legs while standing. °· Have someone stay   with you for 1-2 days if dizziness continues to be a problem. Do this until you feel you are well enough to stay alone. Have the person call your health care provider if he or she notices changes in you that are concerning. °· Do not drive or use heavy machinery if you feel dizzy. °· Do not drink alcohol. °SEEK IMMEDIATE MEDICAL CARE IF:  °· Your dizziness or light-headedness gets worse. °· You feel nauseous or vomit. °· You have problems talking, walking, or using your arms, hands, or legs. °· You feel weak. °· You are not thinking clearly or you have trouble forming sentences. It may take a friend or  family member to notice this. °· You have chest pain, abdominal pain, shortness of breath, or sweating. °· Your vision changes. °· You notice any bleeding. °· You have side effects from medicine that seems to be getting worse rather than better. °MAKE SURE YOU:  °· Understand these instructions. °· Will watch your condition. °· Will get help right away if you are not doing well or get worse. °Document Released: 01/20/2001 Document Revised: 08/01/2013 Document Reviewed: 02/13/2011 °ExitCare® Patient Information ©2015 [employer]. This information is not intended to replace advice given to you by your health care provider. Make sure you discuss any questions you have with your health care provider. ° ° °

## 2015-01-24 ENCOUNTER — Other Ambulatory Visit: Payer: Self-pay | Admitting: Internal Medicine

## 2015-01-24 ENCOUNTER — Encounter: Payer: Self-pay | Admitting: Internal Medicine

## 2015-01-24 ENCOUNTER — Ambulatory Visit (INDEPENDENT_AMBULATORY_CARE_PROVIDER_SITE_OTHER): Payer: Medicare Other | Admitting: Internal Medicine

## 2015-01-24 VITALS — BP 132/82 | HR 87 | Temp 97.5°F | Wt 167.0 lb

## 2015-01-24 DIAGNOSIS — R531 Weakness: Secondary | ICD-10-CM | POA: Diagnosis not present

## 2015-01-24 DIAGNOSIS — F419 Anxiety disorder, unspecified: Secondary | ICD-10-CM

## 2015-01-24 DIAGNOSIS — M797 Fibromyalgia: Secondary | ICD-10-CM | POA: Diagnosis not present

## 2015-01-24 DIAGNOSIS — G713 Mitochondrial myopathy, not elsewhere classified: Secondary | ICD-10-CM

## 2015-01-24 DIAGNOSIS — F329 Major depressive disorder, single episode, unspecified: Secondary | ICD-10-CM

## 2015-01-24 DIAGNOSIS — G729 Myopathy, unspecified: Secondary | ICD-10-CM | POA: Diagnosis not present

## 2015-01-24 DIAGNOSIS — F418 Other specified anxiety disorders: Secondary | ICD-10-CM | POA: Diagnosis not present

## 2015-01-24 NOTE — Telephone Encounter (Signed)
Refill for 3 months. 

## 2015-01-24 NOTE — Progress Notes (Signed)
   Subjective:    Patient ID: CAROLE DONER, female    DOB: [birth year], 69 y.o.   MRN: [id number]  HPI  Seen in ED with c/o hypotension saying BP was 66/56 on several occasions at home with home BP device. Usually BP runs 118/76. Denies N,V,D. Denies CP although ED note says pt had CP. Was monitored for several hours. No arrthymia detected. Was thought to be volume depleted. Was given IV fluids. She felt better and was discharged home. Continues to have a lot of stress. Husband has medical issues as does she. She worries a great deal.    Review of Systems     Objective:   Physical Exam  Blood pressure today is stable. Neck is supple without JVD thyromegaly or carotid bruits. Chest clear to auscultation. Cardiac exam regular rate and rhythm normal S1 and S2. Extremities without edema      Assessment & Plan:  ? Hypotension versus volume depletion resulting in ER visit recently  History of mitochondrial myopathy  Generalized weakness  Fibromyalgia syndrome  Anxiety depression  Plan: Encouraged patient to stay well hydrated during the summer months. Continue same medications for nail. May need to reevaluate some of her pain and anti-depressive medication if symptoms persist.

## 2015-01-28 DIAGNOSIS — H908 Mixed conductive and sensorineural hearing loss, unspecified: Secondary | ICD-10-CM | POA: Diagnosis not present

## 2015-01-28 DIAGNOSIS — H68022 Chronic Eustachian salpingitis, left ear: Secondary | ICD-10-CM | POA: Diagnosis not present

## 2015-01-30 DIAGNOSIS — H73012 Bullous myringitis, left ear: Secondary | ICD-10-CM | POA: Diagnosis not present

## 2015-01-30 DIAGNOSIS — H60322 Hemorrhagic otitis externa, left ear: Secondary | ICD-10-CM | POA: Diagnosis not present

## 2015-02-01 ENCOUNTER — Ambulatory Visit (INDEPENDENT_AMBULATORY_CARE_PROVIDER_SITE_OTHER): Payer: Medicare Other | Admitting: Cardiovascular Disease

## 2015-02-01 ENCOUNTER — Encounter: Payer: Self-pay | Admitting: Cardiovascular Disease

## 2015-02-01 VITALS — BP 144/76 | HR 73 | Ht 71.0 in | Wt 165.0 lb

## 2015-02-01 DIAGNOSIS — I495 Sick sinus syndrome: Secondary | ICD-10-CM | POA: Diagnosis not present

## 2015-02-01 DIAGNOSIS — Z950 Presence of cardiac pacemaker: Secondary | ICD-10-CM

## 2015-02-01 DIAGNOSIS — G729 Myopathy, unspecified: Secondary | ICD-10-CM | POA: Diagnosis not present

## 2015-02-01 DIAGNOSIS — K219 Gastro-esophageal reflux disease without esophagitis: Secondary | ICD-10-CM

## 2015-02-01 DIAGNOSIS — E785 Hyperlipidemia, unspecified: Secondary | ICD-10-CM

## 2015-02-01 DIAGNOSIS — G713 Mitochondrial myopathy, not elsewhere classified: Secondary | ICD-10-CM

## 2015-02-01 NOTE — Patient Instructions (Signed)
Your physician wants you to follow-up in: 1 year or sooner if needed. You will receive a reminder letter in the mail two months in advance. If you don't receive a letter, please call our office to schedule the follow-up appointment.  

## 2015-02-03 ENCOUNTER — Encounter: Payer: Self-pay | Admitting: Cardiovascular Disease

## 2015-02-03 NOTE — Progress Notes (Signed)
Patient ID: Wendy Rose, female   DOB: 05/10/1946, 69 y.o.   MRN: [id number]      HPI:  Wendy Rose is a 69 year old female who presents to the office today for a one year cardiology followup evaluation.   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 She also has been told in the past of having mitral valve prolapse. She has episodes of chronic weakness but on 02/14/2013 she developed profound presyncopal spell and  some chest tightness. She was hypotensive and bradycardic with heart rates in the 30s. She was seen initially by me [facility] emergency room and was transported to [facility]. She was started on dopamine. When I had seen her in the hospital questioned whether or not her conduction abnormalities were related to the possibility of Kearns-Sayre syndrome variant of mitochondrial myopathy. She has had some difficulty with vision but denied any episodes of ptosis. An echo Doppler study showed an ejection fraction of 55-60% without wall motion abnormalities. She did have a mildly calcified aortic annulus. She had mild MR. There was mild TR, and mild pulmonary hypertension with estimated RV systolic pressure at 40 mm. She ultimately required insertion of a permanent pacemaker which was done by Dr. Rayann Heman on 02/16/2013 and  a Biotronik  pacemaker was inserted. She has felt improved with reference to any further episodes of presyncope.  Family history is notable in that essentially all of her family members died with heart disease before the age 73. Most had had cardiac events in their 77s. Has noticed shortness of breath with activity. She does note weakness. She does note vague chest sensation. She presents for followup evaluation. She has been followed by Dr. Tommie Ard Baxley  who has monitored her lipids and has not been on lipid-lowering therapy. When I recently saw her because of vague symptoms and shortness of breath and her strong family history for coronary artery disease I  recommended that she undergo a nuclear perfusion study to assess for potential ischemia. I also recommended advanced lipid testing. A nuclear study was done on 03/23/2013 and was essentially normal. There was a minimal apical defect with paradoxical septal motion with apical dyssynergy most likely related to her pacemaker with overall normal systolic function and without  evidence for ischemia. Advanced lipid testing revealed a total cholesterol 233 triglycerides 82 HDL 64 LDL 153. She has significant increased LDL particle number of 1937 Nanomoles per liter and had 627 small LDL particles. Insulin resistance score was excellent. As result, she was notified in atorvastatin 20 mg was initiated along with coenzyme Q 10.  Inside last saw her one year ago, she has been seen in the EP clinic for atrial flutter and sick sinus syndrome.  She has intact pacemaker function.  She is on chronic anticoagulation with eliquis.  She last saw Roderic Palau in March 2016.  Recently, she has had some issues with low blood pressure.  She was evaluated in the emergency room on 01/18/2015.  The patient states prior to going to the emergency room, her blood pressure had dropped to 66/56, but on presentation, her blood pressure was 106/62.  Troponins were negative.  She was mildly dehydrated and her BUN had risen from 14-22 and creatinine 0.53-0.81 from one year ago.  Subsequently, she has felt improved.  She does have occasional reflux symptoms and also has fibromyalgia.  She will be seeing Dr. Rayann Heman for further evaluation of her paroxysmal atrial flutter.  He denies any exertional chest tightness.  She is unaware of recent palpitations.    Past Medical History  Diagnosis Date  . Fibromyalgia   . MVP (mitral valve prolapse)   . Vertigo   . Anemia   . Anxiety   . Symptomatic bradycardia     s/p Biotronik Evia dual chamber pacemaker implant 02/16/2013 by Dr Rayann Heman  . Arthritis   . Chronic headaches   . Depression   .  GERD (gastroesophageal reflux disease)   . Hyperlipemia   . IBS (irritable bowel syndrome)   . Pneumonia   . UTI (lower urinary tract infection)   . PONV (postoperative nausea and vomiting)   . S/P cardiac pacemaker procedure, 02/16/13, Bio tronik device 02/19/2013    Past Surgical History  Procedure Laterality Date  . Tonsillectomy and adenoidectomy    . Tympanoplasty    . Dilation and curettage of uterus    . Total abdominal hysterectomy    . Bladder suspension    . Cesarean section    . Pacemaker insertion  02/16/2013    Biotronik Evia dual chamber pacemaker implanted by Dr Rayann Heman  . Permanent pacemaker insertion N/A 02/16/2013    Procedure: PERMANENT PACEMAKER INSERTION;  Surgeon: Thompson Grayer, MD;  Location: [facility] CATH LAB;  Service: Cardiovascular;  Laterality: N/A;    Allergies  Allergen Reactions  . Contrast Media [Iodinated Diagnostic Agents] Shortness Of Breath    "difficulty breathing"  . Ciprofloxacin Itching    Pt stated itching   . Cefuroxime Axetil Rash    REACTION: Rash  . Codeine Other (See Comments)    REACTION: Reaction not known  . Naproxen Other (See Comments)    REACTION: Reaction not known  . Nitrofurantoin Rash    REACTION: Increase LFT's  . Sulfonamide Derivatives Other (See Comments)    REACTION: Reaction not known  . Sulindac Other (See Comments)    REACTION: Reaction not known    Current Outpatient Prescriptions  Medication Sig Dispense Refill  . ALPRAZolam (XANAX) 1 MG tablet take 1/2 tablets by mouth every morning 1/2 tablets by mouth every evening then 1 at bedtime 60 tablet 3  . apixaban (ELIQUIS) 5 MG TABS tablet Take 1 tablet (5 mg total) by mouth 2 (two) times daily. 60 tablet 3  . atorvastatin (LIPITOR) 20 MG tablet Take 1 tablet (20 mg total) by mouth daily. 30 tablet 6  . B Complex-C (B-COMPLEX WITH VITAMIN C) tablet Take 1 tablet by mouth daily.    Marland Kitchen buPROPion (WELLBUTRIN XL) 150 MG 24 hr tablet take 1 tablet by mouth every morning 30  tablet 11  . calcium-vitamin D (CALCIUM 500+D) 500-200 MG-UNIT per tablet Take 1 tablet by mouth 2 (two) times daily.     Marland Kitchen co-enzyme Q-10 50 MG capsule Take 50 mg by mouth 2 (two) times daily.    . Creatinine POWD Take by mouth as directed.     Marland Kitchen FLUoxetine (PROZAC) 40 MG capsule Take 1 capsule (40 mg total) by mouth daily. 30 capsule 11  . fluticasone (FLONASE) 50 MCG/ACT nasal spray Place 1 spray into both nostrils daily as needed for allergies or rhinitis.    Marland Kitchen HYDROcodone-acetaminophen (NORCO) 10-325 MG per tablet Take 1 tablet by mouth every 6 (six) hours as needed. 90 tablet 0  . hydrocortisone 2.5 % cream Apply topically 2 (two) times daily. 30 g 1  . lidocaine (LIDODERM) 5 % apply 1 to 3 patches to affected area 12 HOURS OUT OF 24 HOURS 60 patch 2  . Magnesium Oxide (  MAG-200 PO) Take 1 tablet by mouth 2 (two) times daily.     . ondansetron (ZOFRAN) 4 MG tablet Take 1 tablet (4 mg total) by mouth every 8 (eight) hours as needed for nausea or vomiting. 20 tablet 0  . oxyCODONE-acetaminophen (TYLOX) 5-500 MG per capsule Take 1 capsule by mouth every 6 (six) hours as needed for pain. 15 capsule 0  . pantoprazole (PROTONIX) 40 MG tablet take 1 tablet by mouth twice a day 60 tablet 3  . traMADol (ULTRAM) 50 MG tablet take 1 to 2 tablets by mouth twice a day if needed 120 tablet 2   No current facility-administered medications for this visit.    History   Social History  . Marital Status: Married    Spouse Name: N/A  . Number of Children: N/A  . Years of Education: N/A   Occupational History  . Not on file.   Social History Main Topics  . Smoking status: Never Smoker   . Smokeless tobacco: Never Used  . Alcohol Use: No  . Drug Use: No  . Sexual Activity: Yes   Other Topics Concern  . Not on file   Social History Narrative   ROS General: Negative; No fevers, chills, or night sweats;  HEENT: Negative; No changes in vision or hearing, sinus congestion, difficulty  swallowing Pulmonary: Negative; No cough, wheezing, shortness of breath, hemoptysis Cardiovascular: Negative; No chest pain, presyncope, syncope, palpitations GI: Positive for GERD GU: Negative; No dysuria, hematuria, or difficulty voiding Musculoskeletal: Negative; no myalgias, joint pain, or weakness Hematologic/Oncology: Negative; no easy bruising, bleeding Endocrine: Negative; no heat/cold intolerance; no diabetes Rheumatologic: Positive for fibromyalgia Neuro: Negative; no changes in balance, headaches Skin: Negative; No rashes or skin lesions Psychiatric: Negative; No behavioral problems, depression Sleep: Negative; No snoring, daytime sleepiness, hypersomnolence, bruxism, restless legs, hypnogognic hallucinations, no cataplexy Other comprehensive 14 point system review is negative.   PE BP 144/76 mmHg  Pulse 73  Ht '5\' 11"'  (1.803 m)  Wt 165 lb (74.844 kg)  BMI 23.02 kg/m2  Repeat blood pressure by me was 140/70, supine, and was 132/72 standing.  Wt Readings from Last 3 Encounters:  02/01/15 165 lb (74.844 kg)  01/24/15 167 lb (75.751 kg)  11/20/14 167 lb (75.751 kg)   General: Alert, oriented, no distress.  Skin: normal turgor, no rashes HEENT: Normocephalic, atraumatic. Pupils round and reactive; sclera anicteric;no lid lag.  Nose without nasal septal hypertrophy Mouth/Parynx benign; Mallinpatti scale 2 Neck: No JVD, no carotid bruits; normal carotid upstroke Lungs: clear to ausculatation and percussion; no wheezing or rales Heart: RRR, s1 s2 normal 1/6 sem; no diastolic murmur.  No rubs thrills or heaves. Abdomen: soft, nontender; no hepatosplenomehaly, BS+; abdominal aorta nontender and not dilated by palpation. Pulses 2+ Extremities: no clubbing cyanosis or edema, Homan's sign negative  Neurologic: grossly nonfocal Psychological: Normal affect and mood  ECG (independently read by me): AV paced rhythm with 100% capture.  Heart rate 73 bpm.  March 2015 ECG  (independently read by me): AV paced rhythm at 79 beats per minute.  LABS: BMP Latest Ref Rng 01/16/2015 10/16/2014 02/19/2014  Glucose 65 - 99 mg/dL 125(H) 90 86  BUN 6 - 20 mg/dL 22(H) 18 20  Creatinine 0.44 - 1.00 mg/dL 0.81 0.62 0.65  Sodium 135 - 145 mmol/L 134(L) 139 138  Potassium 3.5 - 5.1 mmol/L 4.7 4.3 4.3  Chloride 101 - 111 mmol/L 101 105 102  CO2 22 - 32 mmol/L '25 31 29  ' Calcium  8.9 - 10.3 mg/dL 8.8(L) 9.4 9.0   Hepatic Function Latest Ref Rng 08/23/2014 02/19/2014 06/19/2013  Total Protein 6.0 - 8.3 g/dL 6.6 5.8(L) 6.2  Albumin 3.5 - 5.2 g/dL 4.3 4.2 4.2  AST 0 - 37 U/L '19 16 18  ' ALT 0 - 35 U/L '21 17 18  ' Alk Phosphatase 39 - 117 U/L 73 69 71  Total Bilirubin 0.2 - 1.2 mg/dL 0.6 0.6 0.4  Bilirubin, Direct 0.0 - 0.3 mg/dL 0.1 - 0.1   CBC Latest Ref Rng 01/16/2015 02/19/2014 02/14/2013  WBC 4.0 - 10.5 K/uL 6.3 5.5 6.7  Hemoglobin 12.0 - 15.0 g/dL 12.7 12.6 12.0  Hematocrit 36.0 - 46.0 % 37.8 38.0 35.5(L)  Platelets 150 - 400 K/uL 214 219 218   Lab Results  Component Value Date   MCV 84.9 01/16/2015   MCV 84.6 02/19/2014   MCV 84.9 02/14/2013   Lab Results  Component Value Date   TSH 1.467 02/19/2014   BNP (last 3 results)  Recent Labs  01/16/15 2043  BNP 28.3    ProBNP (last 3 results) No results for input(s): PROBNP in the last 8760 hours.  Lipid Panel     Component Value Date/Time   CHOL 168 08/23/2014 1033   CHOL 233* 03/20/2013 1019   TRIG 91 08/23/2014 1033   TRIG 82 03/20/2013 1019   HDL 62 08/23/2014 1033   HDL 64 03/20/2013 1019   CHOLHDL 2.7 08/23/2014 1033   VLDL 18 08/23/2014 1033   LDLCALC 88 08/23/2014 1033   LDLCALC 153* 03/20/2013 1019    RADIOLOGY: No results found.    ASSESSMENT AND PLAN: Wendy Rose is a 69 year old female with a diagnosis of mitochondrial myopathy and underwent permanent  pacemaker insertion  for bradycardia induced presyncope.  She also has very strong family history for coronary artery disease with essentially all  family members having heart disease before age 27. She has experienced episodes of shortness of breath with activity and has noticed some vague chest sensations. Her  nuclear perfusion study argues against significant ischemia. She did have mild apical septal defect related to her conduction abnormality.  Her blood pressure today is stable, but have any orthostatic symptoms.  She has a history of paroxysmal atrial flutter.  Her ECG today is 100% paced.  Reviewed her office notes from her evaluations with both Dr. Rayann Heman as well as with Roderic Palau over the past year.  She is on eliquis 5 mg twice a day for anticoagulation and has an estimated 2.2% stroke rate per year from her CHA2DS2-VASc Score of 2.  She denies recent bleeding.  She is tolerating atorvastatin 20 mg for hyperlipidemia and her most recent lipid panel in January 2016 showed an LDL of 88, which was improved.  She has GERD but this is stable on Protonix.  She will keep her scheduled follow-up appointment with Dr. Rayann Heman.  I will see her in one year for cardiology evaluation.  Time spent: 25 minutes  Troy Sine, MD, [facility]  02/03/2015 11:43 AM

## 2015-02-05 DIAGNOSIS — R269 Unspecified abnormalities of gait and mobility: Secondary | ICD-10-CM | POA: Diagnosis not present

## 2015-02-05 DIAGNOSIS — R270 Ataxia, unspecified: Secondary | ICD-10-CM | POA: Diagnosis not present

## 2015-02-05 DIAGNOSIS — G713 Mitochondrial myopathy, not elsewhere classified: Secondary | ICD-10-CM | POA: Diagnosis not present

## 2015-02-05 DIAGNOSIS — M6281 Muscle weakness (generalized): Secondary | ICD-10-CM | POA: Diagnosis not present

## 2015-02-06 ENCOUNTER — Encounter: Payer: Self-pay | Admitting: Internal Medicine

## 2015-02-06 DIAGNOSIS — H66012 Acute suppurative otitis media with spontaneous rupture of ear drum, left ear: Secondary | ICD-10-CM | POA: Diagnosis not present

## 2015-02-06 DIAGNOSIS — H73012 Bullous myringitis, left ear: Secondary | ICD-10-CM | POA: Diagnosis not present

## 2015-02-06 NOTE — Patient Instructions (Signed)
Stay well hydrated through the summer months. Continue same medications. Call if symptoms recur.

## 2015-02-08 DIAGNOSIS — M6281 Muscle weakness (generalized): Secondary | ICD-10-CM | POA: Diagnosis not present

## 2015-02-08 DIAGNOSIS — R269 Unspecified abnormalities of gait and mobility: Secondary | ICD-10-CM | POA: Diagnosis not present

## 2015-02-08 DIAGNOSIS — R270 Ataxia, unspecified: Secondary | ICD-10-CM | POA: Diagnosis not present

## 2015-02-08 DIAGNOSIS — G713 Mitochondrial myopathy, not elsewhere classified: Secondary | ICD-10-CM | POA: Diagnosis not present

## 2015-02-11 ENCOUNTER — Other Ambulatory Visit: Payer: Self-pay | Admitting: Internal Medicine

## 2015-02-12 ENCOUNTER — Telehealth: Payer: Self-pay

## 2015-02-12 MED ORDER — PANTOPRAZOLE SODIUM 40 MG PO TBEC: 40.0000 mg | DELAYED_RELEASE_TABLET | Freq: Two times a day (BID) | ORAL | Status: DC

## 2015-02-12 NOTE — Telephone Encounter (Signed)
Please call pharmacy- we cannot get prior auth. Pt has to pay out of pocket OK to refill x 6 months.

## 2015-02-12 NOTE — Telephone Encounter (Signed)
Refilled Pantoprazole 

## 2015-02-13 DIAGNOSIS — M6281 Muscle weakness (generalized): Secondary | ICD-10-CM | POA: Diagnosis not present

## 2015-02-13 DIAGNOSIS — G713 Mitochondrial myopathy, not elsewhere classified: Secondary | ICD-10-CM | POA: Diagnosis not present

## 2015-02-13 DIAGNOSIS — R270 Ataxia, unspecified: Secondary | ICD-10-CM | POA: Diagnosis not present

## 2015-02-13 DIAGNOSIS — R269 Unspecified abnormalities of gait and mobility: Secondary | ICD-10-CM | POA: Diagnosis not present

## 2015-02-15 DIAGNOSIS — M6281 Muscle weakness (generalized): Secondary | ICD-10-CM | POA: Diagnosis not present

## 2015-02-15 DIAGNOSIS — R269 Unspecified abnormalities of gait and mobility: Secondary | ICD-10-CM | POA: Diagnosis not present

## 2015-02-15 DIAGNOSIS — G713 Mitochondrial myopathy, not elsewhere classified: Secondary | ICD-10-CM | POA: Diagnosis not present

## 2015-02-15 DIAGNOSIS — R270 Ataxia, unspecified: Secondary | ICD-10-CM | POA: Diagnosis not present

## 2015-02-19 DIAGNOSIS — G713 Mitochondrial myopathy, not elsewhere classified: Secondary | ICD-10-CM | POA: Diagnosis not present

## 2015-02-19 DIAGNOSIS — M6281 Muscle weakness (generalized): Secondary | ICD-10-CM | POA: Diagnosis not present

## 2015-02-19 DIAGNOSIS — R270 Ataxia, unspecified: Secondary | ICD-10-CM | POA: Diagnosis not present

## 2015-02-19 DIAGNOSIS — R269 Unspecified abnormalities of gait and mobility: Secondary | ICD-10-CM | POA: Diagnosis not present

## 2015-02-21 DIAGNOSIS — H7202 Central perforation of tympanic membrane, left ear: Secondary | ICD-10-CM | POA: Diagnosis not present

## 2015-02-21 DIAGNOSIS — R269 Unspecified abnormalities of gait and mobility: Secondary | ICD-10-CM | POA: Diagnosis not present

## 2015-02-21 DIAGNOSIS — G713 Mitochondrial myopathy, not elsewhere classified: Secondary | ICD-10-CM | POA: Diagnosis not present

## 2015-02-21 DIAGNOSIS — R270 Ataxia, unspecified: Secondary | ICD-10-CM | POA: Diagnosis not present

## 2015-02-21 DIAGNOSIS — M6281 Muscle weakness (generalized): Secondary | ICD-10-CM | POA: Diagnosis not present

## 2015-02-22 ENCOUNTER — Other Ambulatory Visit: Payer: Medicare Other | Admitting: Internal Medicine

## 2015-02-22 DIAGNOSIS — E559 Vitamin D deficiency, unspecified: Secondary | ICD-10-CM

## 2015-02-22 DIAGNOSIS — R5383 Other fatigue: Secondary | ICD-10-CM | POA: Diagnosis not present

## 2015-02-22 DIAGNOSIS — Z79899 Other long term (current) drug therapy: Secondary | ICD-10-CM

## 2015-02-22 DIAGNOSIS — E785 Hyperlipidemia, unspecified: Secondary | ICD-10-CM

## 2015-02-22 LAB — CBC WITH DIFFERENTIAL/PLATELET
BASOS ABS: 0.1 10*3/uL (ref 0.0–0.1)
Basophils Relative: 1 % (ref 0–1)
EOS ABS: 0.3 10*3/uL (ref 0.0–0.7)
Eosinophils Relative: 5 % (ref 0–5)
HCT: 38.9 % (ref 36.0–46.0)
HEMOGLOBIN: 13.1 g/dL (ref 12.0–15.0)
Lymphocytes Relative: 35 % (ref 12–46)
Lymphs Abs: 1.9 10*3/uL (ref 0.7–4.0)
MCH: 28.5 pg (ref 26.0–34.0)
MCHC: 33.7 g/dL (ref 30.0–36.0)
MCV: 84.6 fL (ref 78.0–100.0)
MONO ABS: 0.7 10*3/uL (ref 0.1–1.0)
MONOS PCT: 13 % — AB (ref 3–12)
MPV: 9.9 fL (ref 8.6–12.4)
NEUTROS ABS: 2.5 10*3/uL (ref 1.7–7.7)
NEUTROS PCT: 46 % (ref 43–77)
PLATELETS: 215 10*3/uL (ref 150–400)
RBC: 4.6 MIL/uL (ref 3.87–5.11)
RDW: 14.5 % (ref 11.5–15.5)
WBC: 5.4 10*3/uL (ref 4.0–10.5)

## 2015-02-22 LAB — COMPLETE METABOLIC PANEL WITH GFR
ALBUMIN: 4 g/dL (ref 3.5–5.2)
ALK PHOS: 60 U/L (ref 39–117)
ALT: 24 U/L (ref 0–35)
AST: 24 U/L (ref 0–37)
BILIRUBIN TOTAL: 0.6 mg/dL (ref 0.2–1.2)
BUN: 15 mg/dL (ref 6–23)
CHLORIDE: 103 meq/L (ref 96–112)
CO2: 29 meq/L (ref 19–32)
Calcium: 9.2 mg/dL (ref 8.4–10.5)
Creat: 0.61 mg/dL (ref 0.50–1.10)
GFR, Est African American: >89 mL/min
GFR, Est Non African American: >89 mL/min
Glucose, Bld: 83 mg/dL (ref 70–99)
Potassium: 4.7 mEq/L (ref 3.5–5.3)
SODIUM: 141 meq/L (ref 135–145)
Total Protein: 6.1 g/dL (ref 6.0–8.3)

## 2015-02-22 LAB — LIPID PANEL
CHOL/HDL RATIO: 2.4 ratio
Cholesterol: 153 mg/dL (ref 0–200)
HDL: 64 mg/dL (ref >=46)
LDL Cholesterol: 73 mg/dL (ref 0–99)
Triglycerides: 81 mg/dL (ref <150)
VLDL: 16 mg/dL (ref 0–40)

## 2015-02-22 LAB — TSH: TSH: 1.438 u[IU]/mL (ref 0.350–4.500)

## 2015-02-23 LAB — VITAMIN D 25 HYDROXY (VIT D DEFICIENCY, FRACTURES): Vit D, 25-Hydroxy: 45 ng/mL (ref 30–100)

## 2015-02-25 ENCOUNTER — Encounter: Payer: Self-pay | Admitting: Internal Medicine

## 2015-02-25 ENCOUNTER — Ambulatory Visit (INDEPENDENT_AMBULATORY_CARE_PROVIDER_SITE_OTHER): Payer: Medicare Other | Admitting: Internal Medicine

## 2015-02-25 VITALS — BP 126/70 | HR 84 | Temp 98.8°F | Ht 68.5 in | Wt 165.0 lb

## 2015-02-25 DIAGNOSIS — Z87440 Personal history of urinary (tract) infections: Secondary | ICD-10-CM | POA: Diagnosis not present

## 2015-02-25 DIAGNOSIS — K219 Gastro-esophageal reflux disease without esophagitis: Secondary | ICD-10-CM | POA: Diagnosis not present

## 2015-02-25 DIAGNOSIS — I341 Nonrheumatic mitral (valve) prolapse: Secondary | ICD-10-CM | POA: Diagnosis not present

## 2015-02-25 DIAGNOSIS — Z0000 Encounter for general adult medical examination without abnormal findings: Secondary | ICD-10-CM

## 2015-02-25 DIAGNOSIS — I10 Essential (primary) hypertension: Secondary | ICD-10-CM | POA: Diagnosis not present

## 2015-02-25 DIAGNOSIS — F418 Other specified anxiety disorders: Secondary | ICD-10-CM | POA: Diagnosis not present

## 2015-02-25 DIAGNOSIS — F329 Major depressive disorder, single episode, unspecified: Secondary | ICD-10-CM

## 2015-02-25 DIAGNOSIS — G729 Myopathy, unspecified: Secondary | ICD-10-CM | POA: Diagnosis not present

## 2015-02-25 DIAGNOSIS — F419 Anxiety disorder, unspecified: Secondary | ICD-10-CM

## 2015-02-25 DIAGNOSIS — E785 Hyperlipidemia, unspecified: Secondary | ICD-10-CM | POA: Diagnosis not present

## 2015-02-25 DIAGNOSIS — Z950 Presence of cardiac pacemaker: Secondary | ICD-10-CM | POA: Diagnosis not present

## 2015-02-25 DIAGNOSIS — Z23 Encounter for immunization: Secondary | ICD-10-CM

## 2015-02-25 DIAGNOSIS — J309 Allergic rhinitis, unspecified: Secondary | ICD-10-CM

## 2015-02-25 DIAGNOSIS — G713 Mitochondrial myopathy, not elsewhere classified: Secondary | ICD-10-CM

## 2015-02-25 DIAGNOSIS — Z7901 Long term (current) use of anticoagulants: Secondary | ICD-10-CM | POA: Diagnosis not present

## 2015-02-25 DIAGNOSIS — M797 Fibromyalgia: Secondary | ICD-10-CM

## 2015-02-25 DIAGNOSIS — Z8679 Personal history of other diseases of the circulatory system: Secondary | ICD-10-CM | POA: Diagnosis not present

## 2015-02-25 LAB — POCT URINALYSIS DIPSTICK
BILIRUBIN UA: NEGATIVE
GLUCOSE UA: NEGATIVE
Ketones, UA: NEGATIVE
Nitrite, UA: NEGATIVE
Protein, UA: NEGATIVE
RBC UA: NEGATIVE
Spec Grav, UA: 1.025
UROBILINOGEN UA: NEGATIVE
pH, UA: 6.5

## 2015-02-25 NOTE — Patient Instructions (Addendum)
Continue PT for strengthening. Appt Dr. Annamaria Boots for cough.  Dr. Erik Obey for left ear in November. RTC in 6 months. Continue same meds.

## 2015-02-26 ENCOUNTER — Other Ambulatory Visit: Payer: Medicare Other | Admitting: Internal Medicine

## 2015-02-26 DIAGNOSIS — M6281 Muscle weakness (generalized): Secondary | ICD-10-CM | POA: Diagnosis not present

## 2015-02-26 DIAGNOSIS — R269 Unspecified abnormalities of gait and mobility: Secondary | ICD-10-CM | POA: Diagnosis not present

## 2015-02-26 DIAGNOSIS — G713 Mitochondrial myopathy, not elsewhere classified: Secondary | ICD-10-CM | POA: Diagnosis not present

## 2015-02-26 DIAGNOSIS — R270 Ataxia, unspecified: Secondary | ICD-10-CM | POA: Diagnosis not present

## 2015-02-28 ENCOUNTER — Encounter: Payer: Medicare Other | Admitting: Internal Medicine

## 2015-03-01 DIAGNOSIS — R270 Ataxia, unspecified: Secondary | ICD-10-CM | POA: Diagnosis not present

## 2015-03-01 DIAGNOSIS — G713 Mitochondrial myopathy, not elsewhere classified: Secondary | ICD-10-CM | POA: Diagnosis not present

## 2015-03-01 DIAGNOSIS — M6281 Muscle weakness (generalized): Secondary | ICD-10-CM | POA: Diagnosis not present

## 2015-03-01 DIAGNOSIS — R269 Unspecified abnormalities of gait and mobility: Secondary | ICD-10-CM | POA: Diagnosis not present

## 2015-03-05 DIAGNOSIS — R269 Unspecified abnormalities of gait and mobility: Secondary | ICD-10-CM | POA: Diagnosis not present

## 2015-03-05 DIAGNOSIS — M6281 Muscle weakness (generalized): Secondary | ICD-10-CM | POA: Diagnosis not present

## 2015-03-05 DIAGNOSIS — G713 Mitochondrial myopathy, not elsewhere classified: Secondary | ICD-10-CM | POA: Diagnosis not present

## 2015-03-05 DIAGNOSIS — R270 Ataxia, unspecified: Secondary | ICD-10-CM | POA: Diagnosis not present

## 2015-03-08 DIAGNOSIS — R269 Unspecified abnormalities of gait and mobility: Secondary | ICD-10-CM | POA: Diagnosis not present

## 2015-03-08 DIAGNOSIS — G713 Mitochondrial myopathy, not elsewhere classified: Secondary | ICD-10-CM | POA: Diagnosis not present

## 2015-03-08 DIAGNOSIS — M6281 Muscle weakness (generalized): Secondary | ICD-10-CM | POA: Diagnosis not present

## 2015-03-08 DIAGNOSIS — R270 Ataxia, unspecified: Secondary | ICD-10-CM | POA: Diagnosis not present

## 2015-03-09 ENCOUNTER — Encounter: Payer: Self-pay | Admitting: Internal Medicine
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
## 2015-03-12 DIAGNOSIS — G713 Mitochondrial myopathy, not elsewhere classified: Secondary | ICD-10-CM | POA: Diagnosis not present

## 2015-03-12 DIAGNOSIS — R270 Ataxia, unspecified: Secondary | ICD-10-CM | POA: Diagnosis not present

## 2015-03-12 DIAGNOSIS — R269 Unspecified abnormalities of gait and mobility: Secondary | ICD-10-CM | POA: Diagnosis not present

## 2015-03-12 DIAGNOSIS — M6281 Muscle weakness (generalized): Secondary | ICD-10-CM | POA: Diagnosis not present

## 2015-03-14 DIAGNOSIS — G713 Mitochondrial myopathy, not elsewhere classified: Secondary | ICD-10-CM | POA: Diagnosis not present

## 2015-03-14 DIAGNOSIS — R270 Ataxia, unspecified: Secondary | ICD-10-CM | POA: Diagnosis not present

## 2015-03-14 DIAGNOSIS — R269 Unspecified abnormalities of gait and mobility: Secondary | ICD-10-CM | POA: Diagnosis not present

## 2015-03-14 DIAGNOSIS — M6281 Muscle weakness (generalized): Secondary | ICD-10-CM | POA: Diagnosis not present

## 2015-03-16 ENCOUNTER — Other Ambulatory Visit: Payer: Self-pay | Admitting: Internal Medicine

## 2015-03-18 ENCOUNTER — Other Ambulatory Visit: Payer: Self-pay | Admitting: *Deleted

## 2015-03-18 MED ORDER — ATORVASTATIN CALCIUM 20 MG PO TABS: 20.0000 mg | ORAL_TABLET | Freq: Every day | ORAL | Status: DC

## 2015-03-18 NOTE — Telephone Encounter (Signed)
Refill x 6 months 

## 2015-03-19 DIAGNOSIS — G713 Mitochondrial myopathy, not elsewhere classified: Secondary | ICD-10-CM | POA: Diagnosis not present

## 2015-03-19 DIAGNOSIS — R269 Unspecified abnormalities of gait and mobility: Secondary | ICD-10-CM | POA: Diagnosis not present

## 2015-03-19 DIAGNOSIS — M6281 Muscle weakness (generalized): Secondary | ICD-10-CM | POA: Diagnosis not present

## 2015-03-19 DIAGNOSIS — R270 Ataxia, unspecified: Secondary | ICD-10-CM | POA: Diagnosis not present

## 2015-03-22 DIAGNOSIS — G713 Mitochondrial myopathy, not elsewhere classified: Secondary | ICD-10-CM | POA: Diagnosis not present

## 2015-03-22 DIAGNOSIS — R270 Ataxia, unspecified: Secondary | ICD-10-CM | POA: Diagnosis not present

## 2015-03-22 DIAGNOSIS — M6281 Muscle weakness (generalized): Secondary | ICD-10-CM | POA: Diagnosis not present

## 2015-03-22 DIAGNOSIS — R269 Unspecified abnormalities of gait and mobility: Secondary | ICD-10-CM | POA: Diagnosis not present

## 2015-03-26 ENCOUNTER — Encounter: Payer: Self-pay | Admitting: Internal Medicine

## 2015-03-26 DIAGNOSIS — M6281 Muscle weakness (generalized): Secondary | ICD-10-CM | POA: Diagnosis not present

## 2015-03-26 DIAGNOSIS — R269 Unspecified abnormalities of gait and mobility: Secondary | ICD-10-CM | POA: Diagnosis not present

## 2015-03-26 DIAGNOSIS — G713 Mitochondrial myopathy, not elsewhere classified: Secondary | ICD-10-CM | POA: Diagnosis not present

## 2015-03-26 DIAGNOSIS — R270 Ataxia, unspecified: Secondary | ICD-10-CM | POA: Diagnosis not present

## 2015-03-31 ENCOUNTER — Other Ambulatory Visit: Payer: Self-pay | Admitting: Internal Medicine

## 2015-04-02 DIAGNOSIS — G713 Mitochondrial myopathy, not elsewhere classified: Secondary | ICD-10-CM | POA: Diagnosis not present

## 2015-04-02 DIAGNOSIS — M6281 Muscle weakness (generalized): Secondary | ICD-10-CM | POA: Diagnosis not present

## 2015-04-02 DIAGNOSIS — R270 Ataxia, unspecified: Secondary | ICD-10-CM | POA: Diagnosis not present

## 2015-04-02 DIAGNOSIS — R269 Unspecified abnormalities of gait and mobility: Secondary | ICD-10-CM | POA: Diagnosis not present

## 2015-04-03 ENCOUNTER — Ambulatory Visit (INDEPENDENT_AMBULATORY_CARE_PROVIDER_SITE_OTHER): Payer: Medicare Other | Admitting: Physician Assistant

## 2015-04-03 VITALS — BP 128/64 | HR 98 | Temp 98.3°F | Resp 16 | Ht 70.0 in | Wt 166.4 lb

## 2015-04-03 DIAGNOSIS — N3000 Acute cystitis without hematuria: Secondary | ICD-10-CM | POA: Diagnosis not present

## 2015-04-03 DIAGNOSIS — R300 Dysuria: Secondary | ICD-10-CM | POA: Diagnosis not present

## 2015-04-03 LAB — POCT URINALYSIS DIPSTICK
BILIRUBIN UA: NEGATIVE
Glucose, UA: NEGATIVE
Nitrite, UA: NEGATIVE
PH UA: 5
Protein, UA: NEGATIVE
Spec Grav, UA: >=1.03
Urobilinogen, UA: 0.2

## 2015-04-03 LAB — POCT UA - MICROSCOPIC ONLY
CRYSTALS, UR, HPF, POC: NEGATIVE
Casts, Ur, LPF, POC: NEGATIVE

## 2015-04-03 MED ORDER — FOSFOMYCIN TROMETHAMINE 3 G PO PACK: 3.0000 g | PACK | Freq: Once | ORAL | Status: DC

## 2015-04-03 NOTE — Progress Notes (Signed)
Subjective:    Patient ID: Wendy Rose, female    DOB: 1945/11/04, 69 y.o.   MRN: [id number]  HPI Patient presents for dysuria, decreased vol, frequency, and urgency that have been present since yesterday. Denies hematuria, fever, abdominal pain, N/V. Denies vaginal or bowel sx. Has taken Azo and levaquin from an old rx. Sexually active with husband. Endorses drinking plenty of water.  Allergies  Allergen Reactions  . Contrast Media [Iodinated Diagnostic Agents] Shortness Of Breath    "difficulty breathing"  . Ciprofloxacin Itching    Pt stated itching   . Cefuroxime Axetil Rash    REACTION: Rash  . Codeine Other (See Comments)    REACTION: Reaction not known  . Naproxen Other (See Comments)    REACTION: Reaction not known  . Nitrofurantoin Rash    REACTION: Increase LFT's  . Sulfonamide Derivatives Other (See Comments)    REACTION: Reaction not known  . Sulindac Other (See Comments)    REACTION: Reaction not known   Review of Systems  Constitutional: Negative for fever and chills.  Gastrointestinal: Negative for nausea and vomiting.  Genitourinary: Positive for dysuria, urgency, frequency, decreased urine volume and difficulty urinating. Negative for hematuria, flank pain, vaginal bleeding, vaginal discharge, vaginal pain, pelvic pain and dyspareunia.  Musculoskeletal: Positive for back pain (baseline).       Objective:   Physical Exam  Constitutional: She is oriented to person, place, and time. She appears well-developed and well-nourished. No distress.  Blood pressure 128/64, pulse 98, temperature 98.3 F (36.8 C), temperature source Oral, resp. rate 16, height 5\' 10"  (1.778 m), weight 166 lb 6.4 oz (75.479 kg), SpO2 98 %.  HENT:  Head: Normocephalic and atraumatic.  Right Ear: External ear normal.  Left Ear: External ear normal.  Eyes: Conjunctivae are normal. Right eye exhibits no discharge. Left eye exhibits no discharge. No scleral icterus.  Neck: Neck supple.    Cardiovascular: Normal rate, regular rhythm and normal heart sounds.  Exam reveals no gallop and no friction rub.   No murmur heard. Pulmonary/Chest: Effort normal and breath sounds normal. No respiratory distress. She has no wheezes. She has no rales.  Abdominal: Soft. Bowel sounds are normal. She exhibits no distension. There is no tenderness. There is no rebound, no guarding and no CVA tenderness. No hernia.  Lymphadenopathy:    She has no cervical adenopathy.  Neurological: She is alert and oriented to person, place, and time.  Skin: Skin is warm and dry. No rash noted. She is not diaphoretic. No erythema. No pallor.  Psychiatric: She has a normal mood and affect. Her behavior is normal. Judgment and thought content normal.   Results for orders placed or performed in visit on 04/03/15  POCT urinalysis dipstick  Result Value Ref Range   Color, UA yellow    Clarity, UA cloudy    Glucose, UA neg    Bilirubin, UA neg    Ketones, UA trace    Spec Grav, UA >=1.030    Blood, UA trace-lysed    pH, UA 5.0    Protein, UA neg    Urobilinogen, UA 0.2    Nitrite, UA neg    Leukocytes, UA small (1+) (A) Negative  POCT UA - Microscopic Only  Result Value Ref Range   WBC, Ur, HPF, POC many    RBC, urine, microscopic few    Bacteria, U Microscopic trace    Mucus, UA large    Epithelial cells, urine per micros 0-2  Crystals, Ur, HPF, POC neg    Casts, Ur, LPF, POC neg    Yeast, UA buds    Renal tubular cells few        Assessment & Plan:  1. Acute cystitis without hematuria 2. Dysuria Encouraged to continue to stay hydrated. - fosfomycin (MONUROL) 3 G PACK; Take 3 g by mouth once.  Dispense: 3 g; Refill: 0 - POCT urinalysis dipstick - POCT UA - Microscopic Only - Urine culture  Alveta Heimlich PA-C  [facility] 04/03/2015 1:57 PM

## 2015-04-05 DIAGNOSIS — R270 Ataxia, unspecified: Secondary | ICD-10-CM | POA: Diagnosis not present

## 2015-04-05 DIAGNOSIS — M6281 Muscle weakness (generalized): Secondary | ICD-10-CM | POA: Diagnosis not present

## 2015-04-05 DIAGNOSIS — R269 Unspecified abnormalities of gait and mobility: Secondary | ICD-10-CM | POA: Diagnosis not present

## 2015-04-05 DIAGNOSIS — G713 Mitochondrial myopathy, not elsewhere classified: Secondary | ICD-10-CM | POA: Diagnosis not present

## 2015-04-05 LAB — URINE CULTURE
COLONY COUNT: NO GROWTH
Organism ID, Bacteria: NO GROWTH

## 2015-04-08 ENCOUNTER — Encounter: Payer: Self-pay | Admitting: Physician Assistant

## 2015-04-09 DIAGNOSIS — G713 Mitochondrial myopathy, not elsewhere classified: Secondary | ICD-10-CM | POA: Diagnosis not present

## 2015-04-09 DIAGNOSIS — M6281 Muscle weakness (generalized): Secondary | ICD-10-CM | POA: Diagnosis not present

## 2015-04-09 DIAGNOSIS — R270 Ataxia, unspecified: Secondary | ICD-10-CM | POA: Diagnosis not present

## 2015-04-09 DIAGNOSIS — R269 Unspecified abnormalities of gait and mobility: Secondary | ICD-10-CM | POA: Diagnosis not present

## 2015-04-12 ENCOUNTER — Telehealth: Payer: Self-pay | Admitting: Internal Medicine

## 2015-04-12 ENCOUNTER — Other Ambulatory Visit: Payer: Self-pay | Admitting: *Deleted

## 2015-04-12 DIAGNOSIS — R269 Unspecified abnormalities of gait and mobility: Secondary | ICD-10-CM | POA: Diagnosis not present

## 2015-04-12 DIAGNOSIS — G713 Mitochondrial myopathy, not elsewhere classified: Secondary | ICD-10-CM | POA: Diagnosis not present

## 2015-04-12 DIAGNOSIS — M6281 Muscle weakness (generalized): Secondary | ICD-10-CM | POA: Diagnosis not present

## 2015-04-12 DIAGNOSIS — R270 Ataxia, unspecified: Secondary | ICD-10-CM | POA: Diagnosis not present

## 2015-04-12 MED ORDER — HYDROCODONE-ACETAMINOPHEN 10-325 MG PO TABS: 1.0000 | ORAL_TABLET | Freq: Four times a day (QID) | ORAL | Status: DC | PRN

## 2015-04-12 NOTE — Telephone Encounter (Signed)
Refill once 

## 2015-04-12 NOTE — Telephone Encounter (Signed)
RX printed for patient hydrocodone. Patient being referred to pain management .

## 2015-04-12 NOTE — Telephone Encounter (Signed)
Patient would like to get a refill on her Hydrocodone 10-325 mg.  Is planning to go out of town for approx 1 mo.    Please call when ready for pick up.  Thanks.

## 2015-04-16 DIAGNOSIS — R270 Ataxia, unspecified: Secondary | ICD-10-CM | POA: Diagnosis not present

## 2015-04-16 DIAGNOSIS — R269 Unspecified abnormalities of gait and mobility: Secondary | ICD-10-CM | POA: Diagnosis not present

## 2015-04-16 DIAGNOSIS — G713 Mitochondrial myopathy, not elsewhere classified: Secondary | ICD-10-CM | POA: Diagnosis not present

## 2015-04-16 DIAGNOSIS — M6281 Muscle weakness (generalized): Secondary | ICD-10-CM | POA: Diagnosis not present

## 2015-04-19 DIAGNOSIS — M6281 Muscle weakness (generalized): Secondary | ICD-10-CM | POA: Diagnosis not present

## 2015-04-19 DIAGNOSIS — R270 Ataxia, unspecified: Secondary | ICD-10-CM | POA: Diagnosis not present

## 2015-04-19 DIAGNOSIS — R269 Unspecified abnormalities of gait and mobility: Secondary | ICD-10-CM | POA: Diagnosis not present

## 2015-04-19 DIAGNOSIS — G713 Mitochondrial myopathy, not elsewhere classified: Secondary | ICD-10-CM | POA: Diagnosis not present

## 2015-04-23 DIAGNOSIS — R269 Unspecified abnormalities of gait and mobility: Secondary | ICD-10-CM | POA: Diagnosis not present

## 2015-04-23 DIAGNOSIS — G713 Mitochondrial myopathy, not elsewhere classified: Secondary | ICD-10-CM | POA: Diagnosis not present

## 2015-04-23 DIAGNOSIS — M6281 Muscle weakness (generalized): Secondary | ICD-10-CM | POA: Diagnosis not present

## 2015-04-23 DIAGNOSIS — R270 Ataxia, unspecified: Secondary | ICD-10-CM | POA: Diagnosis not present

## 2015-04-26 DIAGNOSIS — R270 Ataxia, unspecified: Secondary | ICD-10-CM | POA: Diagnosis not present

## 2015-04-26 DIAGNOSIS — R269 Unspecified abnormalities of gait and mobility: Secondary | ICD-10-CM | POA: Diagnosis not present

## 2015-04-26 DIAGNOSIS — G713 Mitochondrial myopathy, not elsewhere classified: Secondary | ICD-10-CM | POA: Diagnosis not present

## 2015-04-26 DIAGNOSIS — M6281 Muscle weakness (generalized): Secondary | ICD-10-CM | POA: Diagnosis not present

## 2015-04-30 DIAGNOSIS — R270 Ataxia, unspecified: Secondary | ICD-10-CM | POA: Diagnosis not present

## 2015-04-30 DIAGNOSIS — M6281 Muscle weakness (generalized): Secondary | ICD-10-CM | POA: Diagnosis not present

## 2015-04-30 DIAGNOSIS — R269 Unspecified abnormalities of gait and mobility: Secondary | ICD-10-CM | POA: Diagnosis not present

## 2015-04-30 DIAGNOSIS — G713 Mitochondrial myopathy, not elsewhere classified: Secondary | ICD-10-CM | POA: Diagnosis not present

## 2015-05-02 DIAGNOSIS — R269 Unspecified abnormalities of gait and mobility: Secondary | ICD-10-CM | POA: Diagnosis not present

## 2015-05-02 DIAGNOSIS — R270 Ataxia, unspecified: Secondary | ICD-10-CM | POA: Diagnosis not present

## 2015-05-02 DIAGNOSIS — G713 Mitochondrial myopathy, not elsewhere classified: Secondary | ICD-10-CM | POA: Diagnosis not present

## 2015-05-02 DIAGNOSIS — M6281 Muscle weakness (generalized): Secondary | ICD-10-CM | POA: Diagnosis not present

## 2015-05-07 DIAGNOSIS — M6281 Muscle weakness (generalized): Secondary | ICD-10-CM | POA: Diagnosis not present

## 2015-05-07 DIAGNOSIS — R269 Unspecified abnormalities of gait and mobility: Secondary | ICD-10-CM | POA: Diagnosis not present

## 2015-05-07 DIAGNOSIS — G713 Mitochondrial myopathy, not elsewhere classified: Secondary | ICD-10-CM | POA: Diagnosis not present

## 2015-05-07 DIAGNOSIS — R270 Ataxia, unspecified: Secondary | ICD-10-CM | POA: Diagnosis not present

## 2015-05-09 DIAGNOSIS — Z1231 Encounter for screening mammogram for malignant neoplasm of breast: Secondary | ICD-10-CM | POA: Diagnosis not present

## 2015-05-10 DIAGNOSIS — R269 Unspecified abnormalities of gait and mobility: Secondary | ICD-10-CM | POA: Diagnosis not present

## 2015-05-10 DIAGNOSIS — M6281 Muscle weakness (generalized): Secondary | ICD-10-CM | POA: Diagnosis not present

## 2015-05-10 DIAGNOSIS — G713 Mitochondrial myopathy, not elsewhere classified: Secondary | ICD-10-CM | POA: Diagnosis not present

## 2015-05-10 DIAGNOSIS — R270 Ataxia, unspecified: Secondary | ICD-10-CM | POA: Diagnosis not present

## 2015-05-14 ENCOUNTER — Encounter: Payer: Self-pay | Admitting: *Deleted

## 2015-05-25 DIAGNOSIS — N390 Urinary tract infection, site not specified: Secondary | ICD-10-CM | POA: Diagnosis not present

## 2015-06-03 ENCOUNTER — Telehealth: Payer: Self-pay

## 2015-06-03 ENCOUNTER — Other Ambulatory Visit: Payer: Self-pay | Admitting: Cardiovascular Disease

## 2015-06-03 MED ORDER — APIXABAN 5 MG PO TABS: 5.0000 mg | ORAL_TABLET | Freq: Two times a day (BID) | ORAL | Status: DC

## 2015-06-03 NOTE — Telephone Encounter (Signed)
Rx(s) sent to pharmacy electronically.  

## 2015-06-03 NOTE — Telephone Encounter (Signed)
°  STAT if patient is at the pharmacy , call can be transferred to refill team.   1. Which medications need to be refilled? Eliquis   2. Which pharmacy/location is medication to be sent to?[facility] in [place] [phone] telephone)   3. Do they need a 30 day or 90 day supply? Box

## 2015-06-03 NOTE — Telephone Encounter (Signed)
Patient called requesting eliquis refill.  Advised patient Dr Caryl Comes prescribes that medication.  Patient will call heartcare and request refill.

## 2015-06-11 ENCOUNTER — Encounter: Payer: Self-pay | Admitting: *Deleted

## 2015-06-13 ENCOUNTER — Encounter: Payer: Self-pay | Admitting: Internal Medicine

## 2015-06-13 ENCOUNTER — Ambulatory Visit (INDEPENDENT_AMBULATORY_CARE_PROVIDER_SITE_OTHER): Payer: Medicare Other | Admitting: Internal Medicine

## 2015-06-13 VITALS — BP 138/82 | HR 80 | Ht 70.0 in | Wt 165.6 lb

## 2015-06-13 DIAGNOSIS — R05 Cough: Secondary | ICD-10-CM | POA: Diagnosis not present

## 2015-06-13 DIAGNOSIS — K219 Gastro-esophageal reflux disease without esophagitis: Secondary | ICD-10-CM | POA: Diagnosis not present

## 2015-06-13 DIAGNOSIS — G713 Mitochondrial myopathy, not elsewhere classified: Secondary | ICD-10-CM

## 2015-06-13 DIAGNOSIS — J309 Allergic rhinitis, unspecified: Secondary | ICD-10-CM | POA: Diagnosis not present

## 2015-06-13 DIAGNOSIS — J302 Other seasonal allergic rhinitis: Secondary | ICD-10-CM

## 2015-06-13 DIAGNOSIS — R059 Cough, unspecified: Secondary | ICD-10-CM | POA: Insufficient documentation

## 2015-06-13 DIAGNOSIS — G729 Myopathy, unspecified: Secondary | ICD-10-CM | POA: Diagnosis not present

## 2015-06-13 DIAGNOSIS — J3089 Other allergic rhinitis: Secondary | ICD-10-CM

## 2015-06-13 DIAGNOSIS — F419 Anxiety disorder, unspecified: Secondary | ICD-10-CM

## 2015-06-13 NOTE — Assessment & Plan Note (Signed)
Reports cough significantly concerns her husband but I note she did not cough at all during our exam today and chest exam was clear. Potentially related to postnasal drip and allergic rhinitis for which we are giving Dymista. More likely related to reflux for which I have asked her to use Protonix twice daily before meals and 2 exercise reflux precautions. Less likely related to asthma/irritable airways but we will let her try sample Breo.

## 2015-06-13 NOTE — Assessment & Plan Note (Signed)
Known history of seasonal allergic rhinitis-spring and fall pollens with postnasal drip which is more perennial Plan-try sample Dymista nasal spray

## 2015-06-13 NOTE — Patient Instructions (Addendum)
Sample Dymista nasal spray    1-2 puffs, each nostril once daily at bedtime  Sample Breo 100    Inhale 1 puff, once daily then rinse mouth  Please ask Dr Renold Genta for suggestions about help for stress  Please let us know how the samples do for you

## 2015-06-13 NOTE — Assessment & Plan Note (Signed)
Significant GERD symptoms despite taking Protonix twice daily and sleeping in recliner. Plan-review reflux precautions, take Protonix before meals,

## 2015-06-13 NOTE — Assessment & Plan Note (Signed)
Substantial stress she attributes mostly to dealing with her husband's problems and anxieties. She is now willing to accept referral to a counselor by her primary physician.

## 2015-06-13 NOTE — Assessment & Plan Note (Signed)
Rare disorder, managed at [facility]

## 2015-06-13 NOTE — Progress Notes (Signed)
06/13/15- 71 yoFnever smoker pt. ref. by dr. Renold Genta. pt. states she's having occ. SOB. denies. wheezing,  chest pain/tightness. persistant  dry cough sometimes prod. clear in color. I have known her because she comes with her husband, followed here for chronic respiratory complaints complicated by GERD and anxiety. She reports he is concerned that her breathing has been labored and that she coughs especially at night. She has been sleeping in a recliner chair. Sometimes as she gets up in the morning she coughs up some clear mucus, otherwise dry. Known history of seasonal allergic rhinitis with postnasal drip but no asthma. Significant GERD despite daily PPI which she was not taking before meals. Cough has not been suppressed by daily use of tramadol and occasional use of hydrocodone. Medical history includes mitochondrial myopathy followed at [facility] and associated with weakness. She admits significant stress associated with husband. CXR 01/16/15 FINDINGS: Left-sided pacemaker overlies normal cardiac silhouette. No effusion, infiltrate, or pneumothorax. No acute osseous abnormality. IMPRESSION: No acute cardiopulmonary process. Electronically Signed  By: Suzy Bouchard M.D.  On: 01/16/2015 21:52  Prior to Admission medications   Medication Sig Start Date End Date Taking? Authorizing Provider  ALPRAZolam Duanne Moron) 1 MG tablet take 1/2 tablets by mouth every morning 1/2 tablets by mouth every evening then 1 at bedtime 11/13/14  Yes Elby Showers, MD  apixaban (ELIQUIS) 5 MG TABS tablet Take 1 tablet (5 mg total) by mouth 2 (two) times daily. 06/03/15  Yes Troy Sine, MD  atorvastatin (LIPITOR) 20 MG tablet Take 1 tablet (20 mg total) by mouth daily. 03/18/15  Yes Troy Sine, MD  B Complex-C (B-COMPLEX WITH VITAMIN C) tablet Take 1 tablet by mouth daily.   Yes Historical Provider, MD  buPROPion (WELLBUTRIN XL) 150 MG 24 hr tablet take 1 tablet by mouth every morning 07/09/14  Yes Elby Showers, MD   calcium-vitamin D (CALCIUM 500+D) 500-200 MG-UNIT per tablet Take 1 tablet by mouth 2 (two) times daily.    Yes Historical Provider, MD  co-enzyme Q-10 50 MG capsule Take 50 mg by mouth 2 (two) times daily.   Yes Historical Provider, MD  Creatinine POWD Take by mouth as directed.  08/11/12  Yes Historical Provider, MD  FLUoxetine (PROZAC) 40 MG capsule take 1 capsule by mouth once daily 04/01/15  Yes Elby Showers, MD  fosfomycin (MONUROL) 3 G PACK Take 3 g by mouth once. 04/03/15  Yes Tishira R Brewington, PA-C  HYDROcodone-acetaminophen (NORCO) 10-325 MG per tablet Take 1 tablet by mouth every 6 (six) hours as needed. 04/12/15  Yes Elby Showers, MD  lidocaine (LIDODERM) 5 % apply 1 to 3 patches to affected area 12 HOURS OUT OF 24 HOURS 02/12/15  Yes Elby Showers, MD  Magnesium Oxide (MAG-200 PO) Take 1 tablet by mouth 2 (two) times daily.    Yes Historical Provider, MD  ondansetron (ZOFRAN) 4 MG tablet Take 1 tablet (4 mg total) by mouth every 8 (eight) hours as needed for nausea or vomiting. 11/20/14  Yes Elby Showers, MD  pantoprazole (PROTONIX) 40 MG tablet Take 1 tablet (40 mg total) by mouth 2 (two) times daily. 02/12/15  Yes Irene Shipper, MD  traMADol Veatrice Bourbon) 50 MG tablet take 1 to 2 tablets by mouth twice a day if needed 03/18/15  Yes Elby Showers, MD  hydrocortisone 2.5 % cream Apply topically 2 (two) times daily. 04/03/13   Elby Showers, MD   Past Medical History  Diagnosis  Date  . Fibromyalgia   . MVP (mitral valve prolapse)   . Vertigo   . Anemia   . Anxiety   . Symptomatic bradycardia     s/p Biotronik Evia dual chamber pacemaker implant 02/16/2013 by Dr Rayann Heman  . Arthritis   . Chronic headaches   . Depression   . GERD (gastroesophageal reflux disease)   . Hyperlipemia   . IBS (irritable bowel syndrome)   . Pneumonia   . UTI (lower urinary tract infection)   . PONV (postoperative nausea and vomiting)   . S/P cardiac pacemaker procedure, 02/16/13, Bio tronik device 02/19/2013    Past Surgical History  Procedure Laterality Date  . Tonsillectomy and adenoidectomy    . Tympanoplasty    . Dilation and curettage of uterus    . Total abdominal hysterectomy    . Bladder suspension    . Cesarean section    . Pacemaker insertion  02/16/2013    Biotronik Evia dual chamber pacemaker implanted by Dr Rayann Heman  . Permanent pacemaker insertion N/A 02/16/2013    Procedure: PERMANENT PACEMAKER INSERTION;  Surgeon: Thompson Grayer, MD;  Location: [facility] CATH LAB;  Service: Cardiovascular;  Laterality: N/A;   Family History  Problem Relation Age of Onset  . Hypertension Mother   . Heart disease Father   . Diabetes Sister   . Heart disease Brother   . Colon cancer Neg Hx   . Esophageal cancer Neg Hx   . Stomach cancer Neg Hx   . Rectal cancer Neg Hx    Social History   Social History  . Marital Status: Married    Spouse Name: N/A  . Number of Children: N/A  . Years of Education: N/A   Occupational History  . Not on file.   Social History Main Topics  . Smoking status: Never Smoker   . Smokeless tobacco: Never Used  . Alcohol Use: No  . Drug Use: No  . Sexual Activity: Yes   Other Topics Concern  . Not on file   Social History Narrative   ROS-see HPI   Negative unless "+" Constitutional:    weight loss, night sweats, fevers, chills, fatigue, lassitude. HEENT:    headaches, difficulty swallowing, tooth/dental problems, sore throat,       sneezing, itching, ear ache, nasal congestion, + post nasal drip, snoring CV:    chest pain, orthopnea, PND, swelling in lower extremities, anasarca,                                                  dizziness, palpitations Resp:   shortness of breath with exertion or at rest.                productive cough,  + non-productive cough, coughing up of blood.              change in color of mucus.  wheezing.   Skin:    rash or lesions. GI:  + heartburn, indigestion, abdominal pain, nausea, vomiting, diarrhea,                 change  in bowel habits, loss of appetite GU: dysuria, change in color of urine, no urgency or frequency.   flank pain. MS:   joint pain, stiffness, decreased range of motion, back pain. Neuro-     nothing unusual Psych:  change in mood or affect.  depression or anxiety.   memory loss.  OBJ- Physical Exam General- Alert, Oriented, Affect-appropriate, Distress- none acute Skin- rash-none, lesions- none, excoriation- none Lymphadenopathy- none Head- atraumatic            Eyes- Gross vision intact, PERRLA, conjunctivae and secretions clear            Ears- Hearing, canals-normal            Nose- Clear, no-Septal dev, mucus, polyps, erosion, perforation             Throat- Mallampati II , mucosa clear , drainage- none, tonsils- atrophic Neck- flexible , trachea midline, no stridor , thyroid nl, carotid no bruit Chest - symmetrical excursion , unlabored           Heart/CV- RRR , no murmur , no gallop  , no rub, nl s1 s2                           - JVD- none , edema- none, stasis changes- none, varices- none           Lung- clear to P&A, wheeze- none, cough- none at this visit , dullness-none, rub- none           Chest wall-  Abd-  Br/ Gen/ Rectal- Not done, not indicated Extrem- cyanosis- none, clubbing, none, atrophy- none, strength + cane Neuro- grossly intact to observation

## 2015-06-17 ENCOUNTER — Ambulatory Visit (INDEPENDENT_AMBULATORY_CARE_PROVIDER_SITE_OTHER): Payer: Medicare Other | Admitting: Internal Medicine

## 2015-06-17 ENCOUNTER — Encounter: Payer: Self-pay | Admitting: Internal Medicine

## 2015-06-17 VITALS — BP 140/72 | HR 70 | Ht 70.0 in | Wt 166.0 lb

## 2015-06-17 DIAGNOSIS — I495 Sick sinus syndrome: Secondary | ICD-10-CM | POA: Diagnosis not present

## 2015-06-17 DIAGNOSIS — I484 Atypical atrial flutter: Secondary | ICD-10-CM

## 2015-06-17 DIAGNOSIS — Z950 Presence of cardiac pacemaker: Secondary | ICD-10-CM

## 2015-06-17 NOTE — Patient Instructions (Signed)
Medication Instructions:  Your physician recommends that you continue on your current medications as directed. Please refer to the Current Medication list given to you today.   Labwork: None ordered   Testing/Procedures: None ordered   Follow-Up: Remote monitoring is used to monitor your Pacemaker  from home. This monitoring reduces the number of office visits required to check your device to one time per year. It allows [place] to keep an eye on the functioning of your device to ensure it is working properly. You are scheduled for a device check from home on 09/11/15. You may send your transmission at any time that day. If you have a wireless device, the transmission will be sent automatically. After your physician reviews your transmission, you will receive a postcard with your next transmission date.  Your physician recommends that you schedule a follow-up appointment in: 12 months with Chanetta Marshall, NP       Any Other Special Instructions Will Be Listed Below (If Applicable).     If you need a refill on your cardiac medications before your next appointment, please call your pharmacy.

## 2015-06-17 NOTE — Progress Notes (Signed)
PCP: Elby Showers, MD Primary Cardiologist:  Dr Murtis Sink Hornstein is a 69 y.o. female who presents today for routine electrophysiology followup.  She is concerned about depression and slow decline.  She is unaware of her arrhythmia.  SOB is stable.  Today, she denies symptoms of palpitations, chest pain,  lower extremity edema, dizziness, presyncope, or syncope. No bleeding issues with NOAC. The patient is otherwise without complaint today.   Past Medical History  Diagnosis Date  . Fibromyalgia   . MVP (mitral valve prolapse)   . Vertigo   . Anemia   . Anxiety   . Symptomatic bradycardia     s/p Biotronik Evia dual chamber pacemaker implant 02/16/2013 by Dr Rayann Heman  . Arthritis   . Chronic headaches   . Depression   . GERD (gastroesophageal reflux disease)   . Hyperlipemia   . IBS (irritable bowel syndrome)   . Pneumonia   . UTI (lower urinary tract infection)   . PONV (postoperative nausea and vomiting)   . S/P cardiac pacemaker procedure, 02/16/13, Bio tronik device 02/19/2013   Past Surgical History  Procedure Laterality Date  . Tonsillectomy and adenoidectomy    . Tympanoplasty    . Dilation and curettage of uterus    . Total abdominal hysterectomy    . Bladder suspension    . Cesarean section    . Pacemaker insertion  02/16/2013    Biotronik Evia dual chamber pacemaker implanted by Dr Rayann Heman  . Permanent pacemaker insertion N/A 02/16/2013    Procedure: PERMANENT PACEMAKER INSERTION;  Surgeon: Thompson Grayer, MD;  Location: [facility] CATH LAB;  Service: Cardiovascular;  Laterality: N/A;    Current Outpatient Prescriptions  Medication Sig Dispense Refill  . ALPRAZolam (XANAX) 1 MG tablet take 1/2 tablets by mouth every morning 1/2 tablets by mouth every evening then 1 at bedtime 60 tablet 3  . apixaban (ELIQUIS) 5 MG TABS tablet Take 1 tablet (5 mg total) by mouth 2 (two) times daily. 180 tablet 0  . atorvastatin (LIPITOR) 20 MG tablet Take 1 tablet (20 mg total) by mouth daily. 30  tablet 11  . B Complex-C (B-COMPLEX WITH VITAMIN C) tablet Take 1 tablet by mouth daily.    Marland Kitchen buPROPion (WELLBUTRIN XL) 150 MG 24 hr tablet take 1 tablet by mouth every morning 30 tablet 11  . calcium-vitamin D (CALCIUM 500+D) 500-200 MG-UNIT per tablet Take 1 tablet by mouth 2 (two) times daily.     Marland Kitchen co-enzyme Q-10 50 MG capsule Take 50 mg by mouth 2 (two) times daily.    . Creatinine POWD Take by mouth as directed.     Marland Kitchen FLUoxetine (PROZAC) 40 MG capsule take 1 capsule by mouth once daily 30 capsule 11  . fosfomycin (MONUROL) 3 G PACK Take 3 g by mouth once. 3 g 0  . HYDROcodone-acetaminophen (NORCO) 10-325 MG tablet Take 1 tablet by mouth every 6 (six) hours as needed (pain).    . hydrocortisone 2.5 % cream Apply 1 application topically 2 (two) times daily.    Marland Kitchen lidocaine (LIDODERM) 5 % apply 1 to 3 patches to affected area 12 HOURS OUT OF 24 HOURS 90 patch 5  . Magnesium Oxide (MAG-200 PO) Take 1 tablet by mouth 2 (two) times daily.     . ondansetron (ZOFRAN) 4 MG tablet Take 1 tablet (4 mg total) by mouth every 8 (eight) hours as needed for nausea or vomiting. 20 tablet 0  . pantoprazole (PROTONIX) 40 MG tablet Take  1 tablet (40 mg total) by mouth 2 (two) times daily. 60 tablet 3  . traMADol (ULTRAM) 50 MG tablet Take 1 to 2 tablets by mouth twice a day if needed for pain     No current facility-administered medications for this visit.    Physical Exam: Filed Vitals:   06/17/15 1115  BP: 140/72  Pulse: 70  Height: 5\' 10"  (1.778 m)  Weight: 166 lb (75.297 kg)    GEN- The patient is well appearing, alert and oriented x 3 today.   Head- normocephalic, atraumatic Eyes-  Sclera clear, conjunctiva pink Ears- hearing intact Oropharynx- clear Lungs- Clear to ausculation bilaterally, normal work of breathing Chest- pacemaker pocket is well healed Heart- Regular rate and rhythm, no murmurs, rubs or gallops, PMI not laterally displaced GI- soft, NT, ND, + BS Extremities- no clubbing,  cyanosis, or edema  Pacemaker interrogation- reviewed in detail today,  See PACEART report.    Assessment and Plan:  1. Sinus bradycardia/ sick sinus syndrome Normal pacemaker function See Pace Art report No changes today  2. Asymptomatic atypical aflutter/stroke risk Continue  Eliquis 5 mg bid(crcl 96.5).  Asymptomatic Will continue to follow  Remotes Return to see EP NP in 1 year Follow-up with Dr Claiborne Billings as scheduled  Thompson Grayer MD, [facility] 06/17/2015 11:52 AM

## 2015-06-18 DIAGNOSIS — M6281 Muscle weakness (generalized): Secondary | ICD-10-CM | POA: Diagnosis not present

## 2015-06-18 DIAGNOSIS — G713 Mitochondrial myopathy, not elsewhere classified: Secondary | ICD-10-CM | POA: Diagnosis not present

## 2015-06-18 DIAGNOSIS — R270 Ataxia, unspecified: Secondary | ICD-10-CM | POA: Diagnosis not present

## 2015-06-18 DIAGNOSIS — R269 Unspecified abnormalities of gait and mobility: Secondary | ICD-10-CM | POA: Diagnosis not present

## 2015-06-20 DIAGNOSIS — M6281 Muscle weakness (generalized): Secondary | ICD-10-CM | POA: Diagnosis not present

## 2015-06-20 DIAGNOSIS — G713 Mitochondrial myopathy, not elsewhere classified: Secondary | ICD-10-CM | POA: Diagnosis not present

## 2015-06-20 DIAGNOSIS — R269 Unspecified abnormalities of gait and mobility: Secondary | ICD-10-CM | POA: Diagnosis not present

## 2015-06-20 DIAGNOSIS — R270 Ataxia, unspecified: Secondary | ICD-10-CM | POA: Diagnosis not present

## 2015-06-26 ENCOUNTER — Emergency Department (HOSPITAL_COMMUNITY)
Admission: EM | Admit: 2015-06-26 | Discharge: 2015-06-26 | Disposition: A | Discharge status: Home / Self Care | Payer: Medicare Other | Source: Home / Self Care

## 2015-06-26 ENCOUNTER — Encounter (HOSPITAL_COMMUNITY): Payer: Self-pay | Admitting: Emergency Medicine

## 2015-06-26 DIAGNOSIS — B029 Zoster without complications: Secondary | ICD-10-CM | POA: Diagnosis not present

## 2015-06-26 MED ORDER — TRIAMCINOLONE ACETONIDE 0.1 % EX CREA: 1.0000 "application " | TOPICAL_CREAM | Freq: Two times a day (BID) | CUTANEOUS | Status: DC

## 2015-06-26 MED ORDER — VALACYCLOVIR HCL 1 G PO TABS: 1000.0000 mg | ORAL_TABLET | Freq: Three times a day (TID) | ORAL | Status: AC

## 2015-06-26 NOTE — Discharge Instructions (Signed)
Shingles Shingles is an infection that causes a painful skin rash and fluid-filled blisters. Shingles is caused by the same virus that causes chickenpox. Shingles only develops in people who:  Have had chickenpox.  Have gotten the chickenpox vaccine. (This is rare.) The first symptoms of shingles may be itching, tingling, or pain in an area on your skin. A rash will follow in a few days or weeks. The rash is usually on one side of the body in a bandlike or beltlike pattern. Over time, the rash turns into fluid-filled blisters that break open, scab over, and dry up. Medicines may:  Help you manage pain.  Help you recover more quickly.  Help to prevent long-term problems. HOME CARE Medicines  Take medicines only as told by your doctor.  Apply an anti-itch or numbing cream to the affected area as told by your doctor. Blister and Rash Care  Take a cool bath or put cool compresses on the area of the rash or blisters as told by your doctor. This may help with pain and itching.  Keep your rash covered with a loose bandage (dressing). Wear loose-fitting clothing.  Keep your rash and blisters clean with mild soap and cool water or as told by your doctor.  Check your rash every day for signs of infection. These include redness, swelling, and pain that lasts or gets worse.  Do not pick your blisters.  Do not scratch your rash. General Instructions  Rest as told by your doctor.  Keep all follow-up visits as told by your doctor. This is important.  Until your blisters scab over, your infection can cause chickenpox in people who have never had it or been vaccinated against it. To prevent this from happening, avoid touching other people or being around other people, especially:  Babies.  Pregnant women.  Children who have eczema.  Elderly people who have transplants.  People who have chronic illnesses, such as leukemia or AIDS. GET HELP IF:  Your pain does not get better with  medicine.  Your pain does not get better after the rash heals.  Your rash looks infected. Signs of infection include:  Redness.  Swelling.  Pain that lasts or gets worse. GET HELP RIGHT AWAY IF:  The rash is on your face or nose.  You have pain in your face, pain around your eye area, or loss of feeling on one side of your face.  You have ear pain or you have ringing in your ear.  You have loss of taste.  Your condition gets worse.   This information is not intended to replace advice given to you by your health care provider. Make sure you discuss any questions you have with your health care provider.   Document Released: 01/13/2008 Document Revised: 08/17/2014 Document Reviewed: 05/08/2014 Elsevier Interactive Patient Education 2016 [employer]  

## 2015-06-26 NOTE — ED Provider Notes (Signed)
CSN: [id number]     Arrival date & time 06/26/15  1512 History   None    Chief Complaint  Patient presents with  . Rash  . Herpes Zoster   (Consider location/radiation/quality/duration/timing/severity/associated sxs/prior Treatment) HPI  She is a 69 year old woman here for evaluation of rash. She states she noticed some itching to her left upper back and shoulder yesterday. This morning she developed a rash on her left shoulder blade. She applied some antibiotic cream which relieved the itching. Some additional spots have come up over the course of the day.  Her husband is currently in the ICU. He was admitted on Monday.  Past Medical History  Diagnosis Date  . Fibromyalgia   . MVP (mitral valve prolapse)   . Vertigo   . Anemia   . Anxiety   . Symptomatic bradycardia     s/p Biotronik Evia dual chamber pacemaker implant 02/16/2013 by Dr Rayann Heman  . Arthritis   . Chronic headaches   . Depression   . GERD (gastroesophageal reflux disease)   . Hyperlipemia   . IBS (irritable bowel syndrome)   . Pneumonia   . UTI (lower urinary tract infection)   . PONV (postoperative nausea and vomiting)   . S/P cardiac pacemaker procedure, 02/16/13, Bio tronik device 02/19/2013   Past Surgical History  Procedure Laterality Date  . Tonsillectomy and adenoidectomy    . Tympanoplasty    . Dilation and curettage of uterus    . Total abdominal hysterectomy    . Bladder suspension    . Cesarean section    . Pacemaker insertion  02/16/2013    Biotronik Evia dual chamber pacemaker implanted by Dr Rayann Heman  . Permanent pacemaker insertion N/A 02/16/2013    Procedure: PERMANENT PACEMAKER INSERTION;  Surgeon: Thompson Grayer, MD;  Location: [facility] CATH LAB;  Service: Cardiovascular;  Laterality: N/A;   Family History  Problem Relation Age of Onset  . Hypertension Mother   . Heart disease Father   . Diabetes Sister   . Heart disease Brother   . Colon cancer Neg Hx   . Esophageal cancer Neg Hx   . Stomach  cancer Neg Hx   . Rectal cancer Neg Hx    Social History  Substance Use Topics  . Smoking status: Never Smoker   . Smokeless tobacco: Never Used  . Alcohol Use: No   OB History    No data available     Review of Systems As in history of present illness Allergies  Contrast media; Ciprofloxacin; Cefuroxime axetil; Codeine; Naproxen; Nitrofurantoin; Sulfonamide derivatives; and Sulindac  Home Medications   Prior to Admission medications   Medication Sig Start Date End Date Taking? Authorizing Provider  ALPRAZolam Duanne Moron) 1 MG tablet take 1/2 tablets by mouth every morning 1/2 tablets by mouth every evening then 1 at bedtime 11/13/14   Elby Showers, MD  apixaban (ELIQUIS) 5 MG TABS tablet Take 1 tablet (5 mg total) by mouth 2 (two) times daily. 06/03/15   Troy Sine, MD  atorvastatin (LIPITOR) 20 MG tablet Take 1 tablet (20 mg total) by mouth daily. 03/18/15   Troy Sine, MD  B Complex-C (B-COMPLEX WITH VITAMIN C) tablet Take 1 tablet by mouth daily.    Historical Provider, MD  buPROPion (WELLBUTRIN XL) 150 MG 24 hr tablet take 1 tablet by mouth every morning 07/09/14   Elby Showers, MD  calcium-vitamin D (CALCIUM 500+D) 500-200 MG-UNIT per tablet Take 1 tablet by mouth 2 (two) times  daily.     Historical Provider, MD  co-enzyme Q-10 50 MG capsule Take 50 mg by mouth 2 (two) times daily.    Historical Provider, MD  Creatinine POWD Take by mouth as directed.  08/11/12   Historical Provider, MD  FLUoxetine (PROZAC) 40 MG capsule take 1 capsule by mouth once daily 04/01/15   Elby Showers, MD  fosfomycin (MONUROL) 3 G PACK Take 3 g by mouth once. 04/03/15   Tishira R Brewington, PA-C  HYDROcodone-acetaminophen (NORCO) 10-325 MG tablet Take 1 tablet by mouth every 6 (six) hours as needed (pain).    Historical Provider, MD  hydrocortisone 2.5 % cream Apply 1 application topically 2 (two) times daily.    Historical Provider, MD  lidocaine (LIDODERM) 5 % apply 1 to 3 patches to affected area  12 HOURS OUT OF 24 HOURS 02/12/15   Elby Showers, MD  Magnesium Oxide (MAG-200 PO) Take 1 tablet by mouth 2 (two) times daily.     Historical Provider, MD  ondansetron (ZOFRAN) 4 MG tablet Take 1 tablet (4 mg total) by mouth every 8 (eight) hours as needed for nausea or vomiting. 11/20/14   Elby Showers, MD  pantoprazole (PROTONIX) 40 MG tablet Take 1 tablet (40 mg total) by mouth 2 (two) times daily. 02/12/15   Irene Shipper, MD  traMADol (ULTRAM) 50 MG tablet Take 1 to 2 tablets by mouth twice a day if needed for pain    Historical Provider, MD  triamcinolone cream (KENALOG) 0.1 % Apply 1 application topically 2 (two) times daily. 06/26/15   Melony Overly, MD  valACYclovir (VALTREX) 1000 MG tablet Take 1 tablet (1,000 mg total) by mouth 3 (three) times daily. 06/26/15 07/10/15  Melony Overly, MD   Meds Ordered and Administered this Visit  Medications - No data to display  BP 136/64 mmHg  Pulse 71  Temp(Src) 97.8 F (36.6 C) (Oral)  Resp 18  SpO2 100% No data found.   Physical Exam  Constitutional: She is oriented to person, place, and time. She appears well-developed and well-nourished. No distress.  Cardiovascular: Normal rate.   Pulmonary/Chest: Effort normal.  Neurological: She is alert and oriented to person, place, and time.  Skin:  Excoriated papules on left posterior shoulder    ED Course  Procedures (including critical care time)  Labs Review Labs Reviewed - No data to display  Imaging Review No results found.       MDM   1. Shingles    Exam is consistent with shingles. Treat with Valtrex and triamcinolone. Follow-up as needed.    Melony Overly, MD 06/26/15 1728

## 2015-06-26 NOTE — ED Notes (Signed)
The patient presented to the [facility] with a complaint of a rash that she believes to be shingles. The patient stated that the effected area was on her right shoulder area.

## 2015-07-07 ENCOUNTER — Other Ambulatory Visit: Payer: Self-pay | Admitting: Internal Medicine

## 2015-07-07 NOTE — Telephone Encounter (Signed)
Refill x 6 months 

## 2015-07-08 NOTE — Telephone Encounter (Signed)
Phoned to [facility]

## 2015-07-10 DIAGNOSIS — G713 Mitochondrial myopathy, not elsewhere classified: Secondary | ICD-10-CM | POA: Diagnosis not present

## 2015-07-10 DIAGNOSIS — G609 Hereditary and idiopathic neuropathy, unspecified: Secondary | ICD-10-CM | POA: Diagnosis not present

## 2015-07-16 DIAGNOSIS — M6281 Muscle weakness (generalized): Secondary | ICD-10-CM | POA: Diagnosis not present

## 2015-07-16 DIAGNOSIS — G713 Mitochondrial myopathy, not elsewhere classified: Secondary | ICD-10-CM | POA: Diagnosis not present

## 2015-07-16 DIAGNOSIS — R270 Ataxia, unspecified: Secondary | ICD-10-CM | POA: Diagnosis not present

## 2015-07-16 DIAGNOSIS — R269 Unspecified abnormalities of gait and mobility: Secondary | ICD-10-CM | POA: Diagnosis not present

## 2015-07-19 ENCOUNTER — Other Ambulatory Visit: Payer: Self-pay | Admitting: Internal Medicine

## 2015-07-19 DIAGNOSIS — R270 Ataxia, unspecified: Secondary | ICD-10-CM | POA: Diagnosis not present

## 2015-07-19 DIAGNOSIS — G713 Mitochondrial myopathy, not elsewhere classified: Secondary | ICD-10-CM | POA: Diagnosis not present

## 2015-07-19 DIAGNOSIS — M6281 Muscle weakness (generalized): Secondary | ICD-10-CM | POA: Diagnosis not present

## 2015-07-19 DIAGNOSIS — R269 Unspecified abnormalities of gait and mobility: Secondary | ICD-10-CM | POA: Diagnosis not present

## 2015-07-23 DIAGNOSIS — R269 Unspecified abnormalities of gait and mobility: Secondary | ICD-10-CM | POA: Diagnosis not present

## 2015-07-23 DIAGNOSIS — R270 Ataxia, unspecified: Secondary | ICD-10-CM | POA: Diagnosis not present

## 2015-07-23 DIAGNOSIS — G713 Mitochondrial myopathy, not elsewhere classified: Secondary | ICD-10-CM | POA: Diagnosis not present

## 2015-07-23 DIAGNOSIS — M6281 Muscle weakness (generalized): Secondary | ICD-10-CM | POA: Diagnosis not present

## 2015-07-26 DIAGNOSIS — M6281 Muscle weakness (generalized): Secondary | ICD-10-CM | POA: Diagnosis not present

## 2015-07-26 DIAGNOSIS — R270 Ataxia, unspecified: Secondary | ICD-10-CM | POA: Diagnosis not present

## 2015-07-26 DIAGNOSIS — G713 Mitochondrial myopathy, not elsewhere classified: Secondary | ICD-10-CM | POA: Diagnosis not present

## 2015-07-26 DIAGNOSIS — R269 Unspecified abnormalities of gait and mobility: Secondary | ICD-10-CM | POA: Diagnosis not present

## 2015-07-30 DIAGNOSIS — M6281 Muscle weakness (generalized): Secondary | ICD-10-CM | POA: Diagnosis not present

## 2015-07-30 DIAGNOSIS — G713 Mitochondrial myopathy, not elsewhere classified: Secondary | ICD-10-CM | POA: Diagnosis not present

## 2015-07-30 DIAGNOSIS — R269 Unspecified abnormalities of gait and mobility: Secondary | ICD-10-CM | POA: Diagnosis not present

## 2015-07-30 DIAGNOSIS — R270 Ataxia, unspecified: Secondary | ICD-10-CM | POA: Diagnosis not present

## 2015-07-31 ENCOUNTER — Other Ambulatory Visit: Payer: Self-pay | Admitting: Internal Medicine

## 2015-08-01 DIAGNOSIS — R269 Unspecified abnormalities of gait and mobility: Secondary | ICD-10-CM | POA: Diagnosis not present

## 2015-08-01 DIAGNOSIS — G713 Mitochondrial myopathy, not elsewhere classified: Secondary | ICD-10-CM | POA: Diagnosis not present

## 2015-08-01 DIAGNOSIS — M6281 Muscle weakness (generalized): Secondary | ICD-10-CM | POA: Diagnosis not present

## 2015-08-01 DIAGNOSIS — R270 Ataxia, unspecified: Secondary | ICD-10-CM | POA: Diagnosis not present

## 2015-08-06 DIAGNOSIS — M6281 Muscle weakness (generalized): Secondary | ICD-10-CM | POA: Diagnosis not present

## 2015-08-06 DIAGNOSIS — G713 Mitochondrial myopathy, not elsewhere classified: Secondary | ICD-10-CM | POA: Diagnosis not present

## 2015-08-06 DIAGNOSIS — R269 Unspecified abnormalities of gait and mobility: Secondary | ICD-10-CM | POA: Diagnosis not present

## 2015-08-06 DIAGNOSIS — R270 Ataxia, unspecified: Secondary | ICD-10-CM | POA: Diagnosis not present

## 2015-08-16 DIAGNOSIS — R269 Unspecified abnormalities of gait and mobility: Secondary | ICD-10-CM | POA: Diagnosis not present

## 2015-08-16 DIAGNOSIS — R270 Ataxia, unspecified: Secondary | ICD-10-CM | POA: Diagnosis not present

## 2015-08-16 DIAGNOSIS — M6281 Muscle weakness (generalized): Secondary | ICD-10-CM | POA: Diagnosis not present

## 2015-08-16 DIAGNOSIS — G713 Mitochondrial myopathy, not elsewhere classified: Secondary | ICD-10-CM | POA: Diagnosis not present

## 2015-08-20 ENCOUNTER — Other Ambulatory Visit: Payer: Self-pay | Admitting: Internal Medicine

## 2015-08-21 ENCOUNTER — Telehealth: Payer: Self-pay

## 2015-08-21 NOTE — Telephone Encounter (Signed)
Prior auth for Eliquis 5 mg sent to [facility]. 

## 2015-08-22 ENCOUNTER — Telehealth: Payer: Self-pay

## 2015-08-22 NOTE — Telephone Encounter (Signed)
Eliquis approved through 08/09/2016. PA # [id number].

## 2015-08-23 DIAGNOSIS — R269 Unspecified abnormalities of gait and mobility: Secondary | ICD-10-CM | POA: Diagnosis not present

## 2015-08-23 DIAGNOSIS — M6281 Muscle weakness (generalized): Secondary | ICD-10-CM | POA: Diagnosis not present

## 2015-08-23 DIAGNOSIS — R270 Ataxia, unspecified: Secondary | ICD-10-CM | POA: Diagnosis not present

## 2015-08-23 DIAGNOSIS — G713 Mitochondrial myopathy, not elsewhere classified: Secondary | ICD-10-CM | POA: Diagnosis not present

## 2015-08-29 DIAGNOSIS — M6281 Muscle weakness (generalized): Secondary | ICD-10-CM | POA: Diagnosis not present

## 2015-08-29 DIAGNOSIS — G713 Mitochondrial myopathy, not elsewhere classified: Secondary | ICD-10-CM | POA: Diagnosis not present

## 2015-08-29 DIAGNOSIS — R270 Ataxia, unspecified: Secondary | ICD-10-CM | POA: Diagnosis not present

## 2015-08-29 DIAGNOSIS — R269 Unspecified abnormalities of gait and mobility: Secondary | ICD-10-CM | POA: Diagnosis not present

## 2015-08-30 ENCOUNTER — Other Ambulatory Visit: Payer: Medicare Other | Admitting: Internal Medicine

## 2015-09-03 ENCOUNTER — Ambulatory Visit: Payer: Medicare Other | Admitting: Internal Medicine

## 2015-09-03 DIAGNOSIS — R270 Ataxia, unspecified: Secondary | ICD-10-CM | POA: Diagnosis not present

## 2015-09-03 DIAGNOSIS — G713 Mitochondrial myopathy, not elsewhere classified: Secondary | ICD-10-CM | POA: Diagnosis not present

## 2015-09-03 DIAGNOSIS — R269 Unspecified abnormalities of gait and mobility: Secondary | ICD-10-CM | POA: Diagnosis not present

## 2015-09-03 DIAGNOSIS — M6281 Muscle weakness (generalized): Secondary | ICD-10-CM | POA: Diagnosis not present

## 2015-09-05 DIAGNOSIS — M6281 Muscle weakness (generalized): Secondary | ICD-10-CM | POA: Diagnosis not present

## 2015-09-05 DIAGNOSIS — R270 Ataxia, unspecified: Secondary | ICD-10-CM | POA: Diagnosis not present

## 2015-09-05 DIAGNOSIS — G713 Mitochondrial myopathy, not elsewhere classified: Secondary | ICD-10-CM | POA: Diagnosis not present

## 2015-09-05 DIAGNOSIS — R269 Unspecified abnormalities of gait and mobility: Secondary | ICD-10-CM | POA: Diagnosis not present

## 2015-09-10 DIAGNOSIS — R269 Unspecified abnormalities of gait and mobility: Secondary | ICD-10-CM | POA: Diagnosis not present

## 2015-09-10 DIAGNOSIS — G713 Mitochondrial myopathy, not elsewhere classified: Secondary | ICD-10-CM | POA: Diagnosis not present

## 2015-09-10 DIAGNOSIS — M6281 Muscle weakness (generalized): Secondary | ICD-10-CM | POA: Diagnosis not present

## 2015-09-10 DIAGNOSIS — R270 Ataxia, unspecified: Secondary | ICD-10-CM | POA: Diagnosis not present

## 2015-09-13 DIAGNOSIS — R269 Unspecified abnormalities of gait and mobility: Secondary | ICD-10-CM | POA: Diagnosis not present

## 2015-09-13 DIAGNOSIS — M6281 Muscle weakness (generalized): Secondary | ICD-10-CM | POA: Diagnosis not present

## 2015-09-13 DIAGNOSIS — G713 Mitochondrial myopathy, not elsewhere classified: Secondary | ICD-10-CM | POA: Diagnosis not present

## 2015-09-13 DIAGNOSIS — R270 Ataxia, unspecified: Secondary | ICD-10-CM | POA: Diagnosis not present

## 2015-09-16 ENCOUNTER — Ambulatory Visit (INDEPENDENT_AMBULATORY_CARE_PROVIDER_SITE_OTHER): Payer: Medicare Other | Admitting: *Deleted

## 2015-09-16 DIAGNOSIS — I495 Sick sinus syndrome: Secondary | ICD-10-CM | POA: Diagnosis not present

## 2015-09-17 NOTE — Progress Notes (Signed)
Remote pacemaker transmission.   

## 2015-09-24 DIAGNOSIS — R269 Unspecified abnormalities of gait and mobility: Secondary | ICD-10-CM | POA: Diagnosis not present

## 2015-09-24 DIAGNOSIS — M6281 Muscle weakness (generalized): Secondary | ICD-10-CM | POA: Diagnosis not present

## 2015-09-24 DIAGNOSIS — G713 Mitochondrial myopathy, not elsewhere classified: Secondary | ICD-10-CM | POA: Diagnosis not present

## 2015-09-24 DIAGNOSIS — R270 Ataxia, unspecified: Secondary | ICD-10-CM | POA: Diagnosis not present

## 2015-09-26 ENCOUNTER — Other Ambulatory Visit: Payer: Self-pay

## 2015-09-26 MED ORDER — TRAMADOL HCL 50 MG PO TABS: ORAL_TABLET | ORAL | Status: DC

## 2015-09-26 NOTE — Telephone Encounter (Signed)
Refill x 3 months 

## 2015-09-26 NOTE — Telephone Encounter (Signed)
Refill request received from pharmacy °

## 2015-09-26 NOTE — Telephone Encounter (Signed)
Phoned to pharmacy 

## 2015-09-27 DIAGNOSIS — R269 Unspecified abnormalities of gait and mobility: Secondary | ICD-10-CM | POA: Diagnosis not present

## 2015-09-27 DIAGNOSIS — R270 Ataxia, unspecified: Secondary | ICD-10-CM | POA: Diagnosis not present

## 2015-09-27 DIAGNOSIS — G713 Mitochondrial myopathy, not elsewhere classified: Secondary | ICD-10-CM | POA: Diagnosis not present

## 2015-09-27 DIAGNOSIS — M6281 Muscle weakness (generalized): Secondary | ICD-10-CM | POA: Diagnosis not present

## 2015-10-03 DIAGNOSIS — G713 Mitochondrial myopathy, not elsewhere classified: Secondary | ICD-10-CM | POA: Diagnosis not present

## 2015-10-03 DIAGNOSIS — R270 Ataxia, unspecified: Secondary | ICD-10-CM | POA: Diagnosis not present

## 2015-10-03 DIAGNOSIS — M6281 Muscle weakness (generalized): Secondary | ICD-10-CM | POA: Diagnosis not present

## 2015-10-03 DIAGNOSIS — R269 Unspecified abnormalities of gait and mobility: Secondary | ICD-10-CM | POA: Diagnosis not present

## 2015-10-04 DIAGNOSIS — R269 Unspecified abnormalities of gait and mobility: Secondary | ICD-10-CM | POA: Diagnosis not present

## 2015-10-04 DIAGNOSIS — G713 Mitochondrial myopathy, not elsewhere classified: Secondary | ICD-10-CM | POA: Diagnosis not present

## 2015-10-04 DIAGNOSIS — M6281 Muscle weakness (generalized): Secondary | ICD-10-CM | POA: Diagnosis not present

## 2015-10-04 DIAGNOSIS — R270 Ataxia, unspecified: Secondary | ICD-10-CM | POA: Diagnosis not present

## 2015-10-08 DIAGNOSIS — R270 Ataxia, unspecified: Secondary | ICD-10-CM | POA: Diagnosis not present

## 2015-10-08 DIAGNOSIS — R269 Unspecified abnormalities of gait and mobility: Secondary | ICD-10-CM | POA: Diagnosis not present

## 2015-10-08 DIAGNOSIS — M6281 Muscle weakness (generalized): Secondary | ICD-10-CM | POA: Diagnosis not present

## 2015-10-08 DIAGNOSIS — G713 Mitochondrial myopathy, not elsewhere classified: Secondary | ICD-10-CM | POA: Diagnosis not present

## 2015-10-09 DIAGNOSIS — L821 Other seborrheic keratosis: Secondary | ICD-10-CM | POA: Diagnosis not present

## 2015-10-11 DIAGNOSIS — R269 Unspecified abnormalities of gait and mobility: Secondary | ICD-10-CM | POA: Diagnosis not present

## 2015-10-11 DIAGNOSIS — R270 Ataxia, unspecified: Secondary | ICD-10-CM | POA: Diagnosis not present

## 2015-10-11 DIAGNOSIS — G713 Mitochondrial myopathy, not elsewhere classified: Secondary | ICD-10-CM | POA: Diagnosis not present

## 2015-10-11 DIAGNOSIS — M6281 Muscle weakness (generalized): Secondary | ICD-10-CM | POA: Diagnosis not present

## 2015-10-14 ENCOUNTER — Ambulatory Visit: Payer: Medicare Other | Admitting: Internal Medicine

## 2015-10-15 DIAGNOSIS — G713 Mitochondrial myopathy, not elsewhere classified: Secondary | ICD-10-CM | POA: Diagnosis not present

## 2015-10-15 DIAGNOSIS — R270 Ataxia, unspecified: Secondary | ICD-10-CM | POA: Diagnosis not present

## 2015-10-15 DIAGNOSIS — M6281 Muscle weakness (generalized): Secondary | ICD-10-CM | POA: Diagnosis not present

## 2015-10-15 DIAGNOSIS — R269 Unspecified abnormalities of gait and mobility: Secondary | ICD-10-CM | POA: Diagnosis not present

## 2015-10-18 DIAGNOSIS — G713 Mitochondrial myopathy, not elsewhere classified: Secondary | ICD-10-CM | POA: Diagnosis not present

## 2015-10-18 DIAGNOSIS — R270 Ataxia, unspecified: Secondary | ICD-10-CM | POA: Diagnosis not present

## 2015-10-18 DIAGNOSIS — R269 Unspecified abnormalities of gait and mobility: Secondary | ICD-10-CM | POA: Diagnosis not present

## 2015-10-18 DIAGNOSIS — M6281 Muscle weakness (generalized): Secondary | ICD-10-CM | POA: Diagnosis not present

## 2015-10-22 DIAGNOSIS — R270 Ataxia, unspecified: Secondary | ICD-10-CM | POA: Diagnosis not present

## 2015-10-22 DIAGNOSIS — R269 Unspecified abnormalities of gait and mobility: Secondary | ICD-10-CM | POA: Diagnosis not present

## 2015-10-22 DIAGNOSIS — G713 Mitochondrial myopathy, not elsewhere classified: Secondary | ICD-10-CM | POA: Diagnosis not present

## 2015-10-22 DIAGNOSIS — M6281 Muscle weakness (generalized): Secondary | ICD-10-CM | POA: Diagnosis not present

## 2015-10-24 DIAGNOSIS — M6281 Muscle weakness (generalized): Secondary | ICD-10-CM | POA: Diagnosis not present

## 2015-10-24 DIAGNOSIS — G713 Mitochondrial myopathy, not elsewhere classified: Secondary | ICD-10-CM | POA: Diagnosis not present

## 2015-10-24 DIAGNOSIS — R270 Ataxia, unspecified: Secondary | ICD-10-CM | POA: Diagnosis not present

## 2015-10-24 DIAGNOSIS — R269 Unspecified abnormalities of gait and mobility: Secondary | ICD-10-CM | POA: Diagnosis not present

## 2015-10-29 DIAGNOSIS — R269 Unspecified abnormalities of gait and mobility: Secondary | ICD-10-CM | POA: Diagnosis not present

## 2015-10-29 DIAGNOSIS — R270 Ataxia, unspecified: Secondary | ICD-10-CM | POA: Diagnosis not present

## 2015-10-29 DIAGNOSIS — M6281 Muscle weakness (generalized): Secondary | ICD-10-CM | POA: Diagnosis not present

## 2015-10-29 DIAGNOSIS — G713 Mitochondrial myopathy, not elsewhere classified: Secondary | ICD-10-CM | POA: Diagnosis not present

## 2015-11-05 DIAGNOSIS — G713 Mitochondrial myopathy, not elsewhere classified: Secondary | ICD-10-CM | POA: Diagnosis not present

## 2015-11-05 DIAGNOSIS — R269 Unspecified abnormalities of gait and mobility: Secondary | ICD-10-CM | POA: Diagnosis not present

## 2015-11-05 DIAGNOSIS — M6281 Muscle weakness (generalized): Secondary | ICD-10-CM | POA: Diagnosis not present

## 2015-11-05 DIAGNOSIS — R270 Ataxia, unspecified: Secondary | ICD-10-CM | POA: Diagnosis not present

## 2015-11-08 DIAGNOSIS — R270 Ataxia, unspecified: Secondary | ICD-10-CM | POA: Diagnosis not present

## 2015-11-08 DIAGNOSIS — R269 Unspecified abnormalities of gait and mobility: Secondary | ICD-10-CM | POA: Diagnosis not present

## 2015-11-08 DIAGNOSIS — M6281 Muscle weakness (generalized): Secondary | ICD-10-CM | POA: Diagnosis not present

## 2015-11-08 DIAGNOSIS — G713 Mitochondrial myopathy, not elsewhere classified: Secondary | ICD-10-CM | POA: Diagnosis not present

## 2015-11-12 DIAGNOSIS — G713 Mitochondrial myopathy, not elsewhere classified: Secondary | ICD-10-CM | POA: Diagnosis not present

## 2015-11-12 DIAGNOSIS — R269 Unspecified abnormalities of gait and mobility: Secondary | ICD-10-CM | POA: Diagnosis not present

## 2015-11-12 DIAGNOSIS — M6281 Muscle weakness (generalized): Secondary | ICD-10-CM | POA: Diagnosis not present

## 2015-11-12 DIAGNOSIS — R270 Ataxia, unspecified: Secondary | ICD-10-CM | POA: Diagnosis not present

## 2015-11-15 DIAGNOSIS — R269 Unspecified abnormalities of gait and mobility: Secondary | ICD-10-CM | POA: Diagnosis not present

## 2015-11-15 DIAGNOSIS — M6281 Muscle weakness (generalized): Secondary | ICD-10-CM | POA: Diagnosis not present

## 2015-11-15 DIAGNOSIS — G713 Mitochondrial myopathy, not elsewhere classified: Secondary | ICD-10-CM | POA: Diagnosis not present

## 2015-11-15 DIAGNOSIS — R270 Ataxia, unspecified: Secondary | ICD-10-CM | POA: Diagnosis not present

## 2015-11-19 DIAGNOSIS — R269 Unspecified abnormalities of gait and mobility: Secondary | ICD-10-CM | POA: Diagnosis not present

## 2015-11-19 DIAGNOSIS — G713 Mitochondrial myopathy, not elsewhere classified: Secondary | ICD-10-CM | POA: Diagnosis not present

## 2015-11-19 DIAGNOSIS — R270 Ataxia, unspecified: Secondary | ICD-10-CM | POA: Diagnosis not present

## 2015-11-19 DIAGNOSIS — M6281 Muscle weakness (generalized): Secondary | ICD-10-CM | POA: Diagnosis not present

## 2015-11-20 ENCOUNTER — Telehealth: Payer: Self-pay

## 2015-11-21 NOTE — Telephone Encounter (Signed)
errogeneous

## 2015-11-22 DIAGNOSIS — M6281 Muscle weakness (generalized): Secondary | ICD-10-CM | POA: Diagnosis not present

## 2015-11-22 DIAGNOSIS — R270 Ataxia, unspecified: Secondary | ICD-10-CM | POA: Diagnosis not present

## 2015-11-22 DIAGNOSIS — R269 Unspecified abnormalities of gait and mobility: Secondary | ICD-10-CM | POA: Diagnosis not present

## 2015-11-22 DIAGNOSIS — G713 Mitochondrial myopathy, not elsewhere classified: Secondary | ICD-10-CM | POA: Diagnosis not present

## 2015-11-25 ENCOUNTER — Encounter: Payer: Self-pay | Admitting: Internal Medicine

## 2015-11-25 ENCOUNTER — Ambulatory Visit (INDEPENDENT_AMBULATORY_CARE_PROVIDER_SITE_OTHER): Payer: Medicare Other | Admitting: Internal Medicine

## 2015-11-25 VITALS — BP 152/76 | HR 93 | Temp 97.8°F | Resp 18 | Ht 70.0 in | Wt 168.5 lb

## 2015-11-25 DIAGNOSIS — G713 Mitochondrial myopathy, not elsewhere classified: Secondary | ICD-10-CM

## 2015-11-25 DIAGNOSIS — G729 Myopathy, unspecified: Secondary | ICD-10-CM | POA: Diagnosis not present

## 2015-11-25 DIAGNOSIS — F418 Other specified anxiety disorders: Secondary | ICD-10-CM | POA: Diagnosis not present

## 2015-11-25 DIAGNOSIS — R269 Unspecified abnormalities of gait and mobility: Secondary | ICD-10-CM | POA: Diagnosis not present

## 2015-11-25 DIAGNOSIS — M6281 Muscle weakness (generalized): Secondary | ICD-10-CM | POA: Diagnosis not present

## 2015-11-25 DIAGNOSIS — R270 Ataxia, unspecified: Secondary | ICD-10-CM | POA: Diagnosis not present

## 2015-11-25 DIAGNOSIS — M797 Fibromyalgia: Secondary | ICD-10-CM | POA: Diagnosis not present

## 2015-11-25 DIAGNOSIS — F419 Anxiety disorder, unspecified: Secondary | ICD-10-CM

## 2015-11-25 DIAGNOSIS — F329 Major depressive disorder, single episode, unspecified: Secondary | ICD-10-CM

## 2015-11-25 NOTE — Patient Instructions (Addendum)
Keep appt with [facility] May 30. Follow-up here for physical exam in July. Continue same medications and continue physical therapy.

## 2015-11-26 ENCOUNTER — Encounter: Payer: Self-pay | Admitting: Internal Medicine

## 2015-11-26 NOTE — Progress Notes (Signed)
   Subjective:    Patient ID: Wendy Rose, female    DOB: [birth year], 70 y.o.   MRN: [id number]  HPI 70 year old Female in today to discuss issues regarding fibromyalgia and mitochondrial myopathy. She is followed at [facility] by Dr. Vista Deck. Husband has been sick recently. He continues to have issues with his heart and has to go to the emergency room fairly frequently. This is upsetting to her. She's not really physically able to stay at the hospital for long periods of time due to musculoskeletal pain. She's been undergoing physical therapy here in town and finds that somewhat helpful particular with her balance. Physical therapist was able to get her therapy sessions extended by insurance company recently. Says she doesn't go out much anymore. I think she somewhat depressed. Doesn't sleep all that well but may be worried about her husband. Takes tramadol and hydrocodone for pain. Takes Xanax to sleep. She continues to drive.    Review of Systems     Objective:   Physical Exam  Not examined. She really wants my opinion today as to whether or not she should continue with physical therapy. I think she finds it helpful with her balance and gets her out of the house. Says she spending a lot of time sitting in a chair. Says she's not ready for the wheelchair but realizes that day may be coming in the near future. Spent 25 minutes speaking with her about her concerns. I don't think we should change her medications at this point in time. She has follow-up at [facility] May 30. Medication regimen reviewed with her at length.      Assessment & Plan:  Mitochondrial myopathy  History of fibromyalgia syndrome  History of anxiety depression  Plan: Continue with physical therapy as previously ordered. Physical exam scheduled here for July with fasting lab work.

## 2015-11-27 DIAGNOSIS — R270 Ataxia, unspecified: Secondary | ICD-10-CM | POA: Diagnosis not present

## 2015-11-27 DIAGNOSIS — M6281 Muscle weakness (generalized): Secondary | ICD-10-CM | POA: Diagnosis not present

## 2015-11-27 DIAGNOSIS — G713 Mitochondrial myopathy, not elsewhere classified: Secondary | ICD-10-CM | POA: Diagnosis not present

## 2015-11-27 DIAGNOSIS — R269 Unspecified abnormalities of gait and mobility: Secondary | ICD-10-CM | POA: Diagnosis not present

## 2015-12-03 DIAGNOSIS — R269 Unspecified abnormalities of gait and mobility: Secondary | ICD-10-CM | POA: Diagnosis not present

## 2015-12-03 DIAGNOSIS — R270 Ataxia, unspecified: Secondary | ICD-10-CM | POA: Diagnosis not present

## 2015-12-03 DIAGNOSIS — G713 Mitochondrial myopathy, not elsewhere classified: Secondary | ICD-10-CM | POA: Diagnosis not present

## 2015-12-03 DIAGNOSIS — M6281 Muscle weakness (generalized): Secondary | ICD-10-CM | POA: Diagnosis not present

## 2015-12-04 DIAGNOSIS — H9041 Sensorineural hearing loss, unilateral, right ear, with unrestricted hearing on the contralateral side: Secondary | ICD-10-CM | POA: Diagnosis not present

## 2015-12-04 DIAGNOSIS — H906 Mixed conductive and sensorineural hearing loss, bilateral: Secondary | ICD-10-CM | POA: Diagnosis not present

## 2015-12-04 DIAGNOSIS — H722X2 Other marginal perforations of tympanic membrane, left ear: Secondary | ICD-10-CM | POA: Diagnosis not present

## 2015-12-04 DIAGNOSIS — H9072 Mixed conductive and sensorineural hearing loss, unilateral, left ear, with unrestricted hearing on the contralateral side: Secondary | ICD-10-CM | POA: Diagnosis not present

## 2015-12-04 DIAGNOSIS — H9202 Otalgia, left ear: Secondary | ICD-10-CM | POA: Diagnosis not present

## 2015-12-05 DIAGNOSIS — H906 Mixed conductive and sensorineural hearing loss, bilateral: Secondary | ICD-10-CM | POA: Insufficient documentation

## 2015-12-06 DIAGNOSIS — G713 Mitochondrial myopathy, not elsewhere classified: Secondary | ICD-10-CM | POA: Diagnosis not present

## 2015-12-06 DIAGNOSIS — M6281 Muscle weakness (generalized): Secondary | ICD-10-CM | POA: Diagnosis not present

## 2015-12-06 DIAGNOSIS — R269 Unspecified abnormalities of gait and mobility: Secondary | ICD-10-CM | POA: Diagnosis not present

## 2015-12-06 DIAGNOSIS — R270 Ataxia, unspecified: Secondary | ICD-10-CM | POA: Diagnosis not present

## 2015-12-09 ENCOUNTER — Other Ambulatory Visit: Payer: Self-pay

## 2015-12-09 MED ORDER — TRAMADOL HCL 50 MG PO TABS: ORAL_TABLET | ORAL | Status: DC

## 2015-12-10 ENCOUNTER — Ambulatory Visit (INDEPENDENT_AMBULATORY_CARE_PROVIDER_SITE_OTHER): Payer: Medicare Other | Admitting: Internal Medicine

## 2015-12-10 ENCOUNTER — Encounter: Payer: Self-pay | Admitting: Internal Medicine

## 2015-12-10 ENCOUNTER — Telehealth: Payer: Self-pay | Admitting: Internal Medicine

## 2015-12-10 VITALS — BP 130/70 | HR 76 | Temp 98.2°F | Resp 18 | Wt 170.0 lb

## 2015-12-10 DIAGNOSIS — M6281 Muscle weakness (generalized): Secondary | ICD-10-CM | POA: Diagnosis not present

## 2015-12-10 DIAGNOSIS — G729 Myopathy, unspecified: Secondary | ICD-10-CM

## 2015-12-10 DIAGNOSIS — M898X8 Other specified disorders of bone, other site: Secondary | ICD-10-CM | POA: Diagnosis not present

## 2015-12-10 DIAGNOSIS — R269 Unspecified abnormalities of gait and mobility: Secondary | ICD-10-CM | POA: Diagnosis not present

## 2015-12-10 DIAGNOSIS — G713 Mitochondrial myopathy, not elsewhere classified: Secondary | ICD-10-CM | POA: Diagnosis not present

## 2015-12-10 DIAGNOSIS — R270 Ataxia, unspecified: Secondary | ICD-10-CM | POA: Diagnosis not present

## 2015-12-10 MED ORDER — HYDROCODONE-ACETAMINOPHEN 10-325 MG PO TABS: ORAL_TABLET | ORAL | Status: DC

## 2015-12-10 NOTE — Telephone Encounter (Signed)
Patient walked out and walked back in about 3-4 minutes later and asked to be seen this afternoon.  Advised that we could work her in, but she may have to wait 20-30 minutes.  She has had physical therapy today and she's in quite a bit of pain.  States that walking is getting harder and harder for her.  Her husband is in the car, but he'll wait for her.

## 2015-12-10 NOTE — Telephone Encounter (Signed)
Patient walked in this afternoon stating that she is in terrible pain.  Pain has worsened with her myalgia's.  States that her Lidocaine patches are not helping and she needs a refill.  However, she really wants to try something different.  Patient doesn't want to be seen this afternoon.  States she would rather just maybe try a stronger patch or perhaps something different to try for the pain.  States that she has had to increase her use of the Vicodin for pain.    States she used to be able to feel the cold of the patches on her skin and then they would get hot and they did help.  The past 30 days, this entire box of patches, she felt they have not been effective or helped at all.  She even asked the pharmacist if it was the same medication.    Pharmacy:  [facility] on [address]

## 2015-12-11 ENCOUNTER — Telehealth: Payer: Self-pay | Admitting: Internal Medicine

## 2015-12-11 ENCOUNTER — Telehealth: Payer: Self-pay | Admitting: *Deleted

## 2015-12-11 DIAGNOSIS — G729 Myopathy, unspecified: Secondary | ICD-10-CM

## 2015-12-11 NOTE — Telephone Encounter (Signed)
OK this will have to be changed to a CT of the LS spine since pt has a pacemaker.

## 2015-12-11 NOTE — Telephone Encounter (Signed)
Amber @ [facility] called; states that patient will have to have MRI at the hospital due to her having the pacemaker.    About 10 minutes later the patient called to state that she actually spoke her 78 office in the pacemaker department.  They were going to see about having someone at the hospital to come and be present during the MRI.  They are NOT able to do that.  Therefore, the patient can NOT have the MRI due to having the pacemaker.  She will await further instructions from [place] as to the next step to follow.    Please advise.

## 2015-12-11 NOTE — Telephone Encounter (Signed)
Patient called to see if her device is MRI compatible.  Advised patient that I will have to discuss with [employer] rep.  Patient was told by MRI tech that she would have the MRI done at the hospital instead of the office due to her PPM.  Patient wondered if this meant that she would have to spend the night.  Advised patient that patients typically do not have to spend the night, but that any questions related to the test would have to be deferred to the ordering MD.  Patient verbalized understanding.  Spoke with [employer] tech services--patient's RA lead is not MRI-conditional.  Called patient to make her aware.  Advised that she make her the MD who is ordering the MRI aware so that they can come up with a different plan.  Patient verbalizes understanding of information.  She is appreciative of call and denies additional questions or concerns at this time.

## 2015-12-12 NOTE — Telephone Encounter (Signed)
New order entered. Patient will be contacted via [place] [facility]

## 2015-12-13 DIAGNOSIS — G713 Mitochondrial myopathy, not elsewhere classified: Secondary | ICD-10-CM | POA: Diagnosis not present

## 2015-12-13 DIAGNOSIS — M6281 Muscle weakness (generalized): Secondary | ICD-10-CM | POA: Diagnosis not present

## 2015-12-13 DIAGNOSIS — R270 Ataxia, unspecified: Secondary | ICD-10-CM | POA: Diagnosis not present

## 2015-12-13 DIAGNOSIS — R269 Unspecified abnormalities of gait and mobility: Secondary | ICD-10-CM | POA: Diagnosis not present

## 2015-12-13 NOTE — Progress Notes (Signed)
   Subjective:    Patient ID: Wendy Rose, female    DOB: 1946/01/03, 71 y.o.   MRN: [id number]  HPI  70 year old Female with history of mitochondrial myopathy in today complaining of severe right lower back pain. She and her husband will be going to the beach later this month. She is concerned. She's been taking tramadol for pain. She does have hydrocodone/APAP as a rescue medication when tramadol does not help but she doesn't use that very much.  She's been going to physical therapy for back pain for some time. It was recently renewed. She's anxious about all of this. She is afraid she will be wheelchair bound in the future.    Review of Systems     Objective:   Physical Exam  Straight leg raising is negative at 90 bilaterally. Muscle strength is 4/5 in the lower extremities. Her quadriceps are weak. She is tender over her right posterior superior iliac spine.      Assessment & Plan:  Mitochondrial myopathy followed by Dr. Doy Mince at [facility]  Long-standing history of low back pain not responding physical therapy  Plan: MRI of LS-spine. Discontinue tramadol. Prescribed hydrocodone/APAP 10/325 one by mouth twice daily for pain control.

## 2015-12-13 NOTE — Patient Instructions (Signed)
Discontinue tramadol. MRI of LS-spine ordered. Hydrocodone/APAP 10/325 one by mouth twice daily for pain. Continue physical therapy for now.

## 2015-12-16 ENCOUNTER — Ambulatory Visit (INDEPENDENT_AMBULATORY_CARE_PROVIDER_SITE_OTHER): Payer: Medicare Other | Admitting: *Deleted

## 2015-12-16 ENCOUNTER — Ambulatory Visit
Admission: RE | Admit: 2015-12-16 | Discharge: 2015-12-16 | Disposition: A | Discharge status: Home / Self Care | Payer: Medicare Other | Source: Ambulatory Visit | Attending: Internal Medicine | Admitting: Internal Medicine

## 2015-12-16 DIAGNOSIS — M545 Low back pain: Secondary | ICD-10-CM | POA: Diagnosis not present

## 2015-12-16 DIAGNOSIS — G729 Myopathy, unspecified: Secondary | ICD-10-CM

## 2015-12-16 DIAGNOSIS — I495 Sick sinus syndrome: Secondary | ICD-10-CM | POA: Diagnosis not present

## 2015-12-17 DIAGNOSIS — R269 Unspecified abnormalities of gait and mobility: Secondary | ICD-10-CM | POA: Diagnosis not present

## 2015-12-17 DIAGNOSIS — M6281 Muscle weakness (generalized): Secondary | ICD-10-CM | POA: Diagnosis not present

## 2015-12-17 DIAGNOSIS — R270 Ataxia, unspecified: Secondary | ICD-10-CM | POA: Diagnosis not present

## 2015-12-17 DIAGNOSIS — G713 Mitochondrial myopathy, not elsewhere classified: Secondary | ICD-10-CM | POA: Diagnosis not present

## 2015-12-17 NOTE — Progress Notes (Signed)
Remote pacemaker transmission.   

## 2015-12-20 DIAGNOSIS — R269 Unspecified abnormalities of gait and mobility: Secondary | ICD-10-CM | POA: Diagnosis not present

## 2015-12-20 DIAGNOSIS — G713 Mitochondrial myopathy, not elsewhere classified: Secondary | ICD-10-CM | POA: Diagnosis not present

## 2015-12-20 DIAGNOSIS — M6281 Muscle weakness (generalized): Secondary | ICD-10-CM | POA: Diagnosis not present

## 2015-12-20 DIAGNOSIS — R270 Ataxia, unspecified: Secondary | ICD-10-CM | POA: Diagnosis not present

## 2015-12-21 DIAGNOSIS — G713 Mitochondrial myopathy, not elsewhere classified: Secondary | ICD-10-CM | POA: Diagnosis not present

## 2015-12-24 DIAGNOSIS — M6281 Muscle weakness (generalized): Secondary | ICD-10-CM | POA: Diagnosis not present

## 2015-12-24 DIAGNOSIS — R270 Ataxia, unspecified: Secondary | ICD-10-CM | POA: Diagnosis not present

## 2015-12-24 DIAGNOSIS — G713 Mitochondrial myopathy, not elsewhere classified: Secondary | ICD-10-CM | POA: Diagnosis not present

## 2015-12-24 DIAGNOSIS — R269 Unspecified abnormalities of gait and mobility: Secondary | ICD-10-CM | POA: Diagnosis not present

## 2016-01-01 ENCOUNTER — Ambulatory Visit (INDEPENDENT_AMBULATORY_CARE_PROVIDER_SITE_OTHER): Payer: Medicare Other | Admitting: Ophthalmology

## 2016-01-07 DIAGNOSIS — G713 Mitochondrial myopathy, not elsewhere classified: Secondary | ICD-10-CM | POA: Diagnosis not present

## 2016-01-09 ENCOUNTER — Telehealth: Payer: Self-pay | Admitting: Internal Medicine

## 2016-01-09 NOTE — Telephone Encounter (Signed)
Refill #[id number]. Pick up Friday June2

## 2016-01-09 NOTE — Telephone Encounter (Signed)
Requesting refill on her Norco 10-325.  Would like to pick up tomorrow afternoon.    Thank you.

## 2016-01-10 DIAGNOSIS — M6281 Muscle weakness (generalized): Secondary | ICD-10-CM | POA: Diagnosis not present

## 2016-01-10 DIAGNOSIS — R269 Unspecified abnormalities of gait and mobility: Secondary | ICD-10-CM | POA: Diagnosis not present

## 2016-01-10 DIAGNOSIS — G713 Mitochondrial myopathy, not elsewhere classified: Secondary | ICD-10-CM | POA: Diagnosis not present

## 2016-01-10 DIAGNOSIS — R270 Ataxia, unspecified: Secondary | ICD-10-CM | POA: Diagnosis not present

## 2016-01-10 MED ORDER — HYDROCODONE-ACETAMINOPHEN 10-325 MG PO TABS: ORAL_TABLET | ORAL | Status: DC

## 2016-01-10 NOTE — Telephone Encounter (Signed)
Patient notified

## 2016-01-10 NOTE — Telephone Encounter (Signed)
Printed for signature

## 2016-01-13 ENCOUNTER — Ambulatory Visit (INDEPENDENT_AMBULATORY_CARE_PROVIDER_SITE_OTHER): Payer: Medicare Other | Admitting: Ophthalmology

## 2016-01-13 DIAGNOSIS — H43813 Vitreous degeneration, bilateral: Secondary | ICD-10-CM | POA: Diagnosis not present

## 2016-01-13 DIAGNOSIS — H35373 Puckering of macula, bilateral: Secondary | ICD-10-CM | POA: Diagnosis not present

## 2016-01-13 DIAGNOSIS — H353111 Nonexudative age-related macular degeneration, right eye, early dry stage: Secondary | ICD-10-CM | POA: Diagnosis not present

## 2016-01-13 DIAGNOSIS — H2513 Age-related nuclear cataract, bilateral: Secondary | ICD-10-CM | POA: Diagnosis not present

## 2016-01-14 DIAGNOSIS — R269 Unspecified abnormalities of gait and mobility: Secondary | ICD-10-CM | POA: Diagnosis not present

## 2016-01-14 DIAGNOSIS — G713 Mitochondrial myopathy, not elsewhere classified: Secondary | ICD-10-CM | POA: Diagnosis not present

## 2016-01-14 DIAGNOSIS — R270 Ataxia, unspecified: Secondary | ICD-10-CM | POA: Diagnosis not present

## 2016-01-14 DIAGNOSIS — M6281 Muscle weakness (generalized): Secondary | ICD-10-CM | POA: Diagnosis not present

## 2016-01-17 ENCOUNTER — Ambulatory Visit: Payer: Medicare Other | Admitting: Internal Medicine

## 2016-01-17 DIAGNOSIS — R269 Unspecified abnormalities of gait and mobility: Secondary | ICD-10-CM | POA: Diagnosis not present

## 2016-01-17 DIAGNOSIS — R270 Ataxia, unspecified: Secondary | ICD-10-CM | POA: Diagnosis not present

## 2016-01-17 DIAGNOSIS — G713 Mitochondrial myopathy, not elsewhere classified: Secondary | ICD-10-CM | POA: Diagnosis not present

## 2016-01-17 DIAGNOSIS — M6281 Muscle weakness (generalized): Secondary | ICD-10-CM | POA: Diagnosis not present

## 2016-01-17 LAB — CUP PACEART INCLINIC DEVICE CHECK
Brady Statistic RA Percent Paced: 71 %
Brady Statistic RV Percent Paced: 98 %
Date Time Interrogation Session: 20161107171500
Implantable Lead Implant Date: 20140710
Implantable Lead Implant Date: 20140710
Implantable Lead Location: 753860
Lead Channel Sensing Intrinsic Amplitude: 26.3 mV
Lead Channel Setting Pacing Amplitude: 0.9 V
Lead Channel Setting Pacing Amplitude: 1.7 V
MDC IDC LEAD LOCATION: 753859
MDC IDC LEAD MODEL: 346366
MDC IDC LEAD MODEL: 3463689
MDC IDC LEAD SERIAL: 29334372
MDC IDC LEAD SERIAL: 29426855
MDC IDC MSMT LEADCHNL RA IMPEDANCE VALUE: 819 Ohm
MDC IDC MSMT LEADCHNL RA SENSING INTR AMPL: 4.5 mV
MDC IDC MSMT LEADCHNL RV IMPEDANCE VALUE: 877 Ohm
MDC IDC MSMT LEADCHNL RV PACING THRESHOLD AMPLITUDE: 0.4 V
MDC IDC MSMT LEADCHNL RV PACING THRESHOLD PULSEWIDTH: 0.4 ms
MDC IDC PG SERIAL: 66385898
MDC IDC SET LEADCHNL RV PACING PULSEWIDTH: 0.4 ms

## 2016-01-17 LAB — CUP PACEART REMOTE DEVICE CHECK
Brady Statistic RA Percent Paced: 65 %
Brady Statistic RV Percent Paced: 93 %
Implantable Lead Implant Date: 20140710
Implantable Lead Location: 753860
Implantable Lead Model: 346366
Implantable Lead Serial Number: 29426855
Lead Channel Impedance Value: 761 Ohm
Lead Channel Pacing Threshold Amplitude: 0.4 V
Lead Channel Pacing Threshold Pulse Width: 0.4 ms
Lead Channel Setting Pacing Amplitude: 0.9 V
Lead Channel Setting Pacing Amplitude: 1.7 V
Lead Channel Setting Pacing Pulse Width: 0.4 ms
MDC IDC LEAD IMPLANT DT: 20140710
MDC IDC LEAD LOCATION: 753859
MDC IDC LEAD MODEL: 3463689
MDC IDC LEAD SERIAL: 29334372
MDC IDC MSMT LEADCHNL RA PACING THRESHOLD AMPLITUDE: 0.6 V
MDC IDC MSMT LEADCHNL RA PACING THRESHOLD PULSEWIDTH: 0.4 ms
MDC IDC MSMT LEADCHNL RA SENSING INTR AMPL: 1.5 mV
MDC IDC MSMT LEADCHNL RV IMPEDANCE VALUE: 761 Ohm
MDC IDC MSMT LEADCHNL RV SENSING INTR AMPL: 20.2 mV
MDC IDC PG SERIAL: 66385898
MDC IDC SESS DTM: 20170609104103

## 2016-01-20 ENCOUNTER — Other Ambulatory Visit: Payer: Self-pay | Admitting: Cardiovascular Disease

## 2016-01-20 MED ORDER — ATORVASTATIN CALCIUM 20 MG PO TABS: 20.0000 mg | ORAL_TABLET | Freq: Every day | ORAL | Status: DC

## 2016-01-20 NOTE — Telephone Encounter (Signed)
Rx(s) sent to pharmacy electronically.  

## 2016-01-22 ENCOUNTER — Encounter: Payer: Self-pay | Admitting: Cardiology

## 2016-01-23 ENCOUNTER — Telehealth: Payer: Self-pay

## 2016-01-23 LAB — CUP PACEART REMOTE DEVICE CHECK
Brady Statistic RA Percent Paced: 65 %
Date Time Interrogation Session: 20170615130429
Implantable Lead Implant Date: 20140710
Implantable Lead Location: 753860
Implantable Lead Model: 3463689
Implantable Lead Serial Number: 29334372
Implantable Lead Serial Number: 29426855
Lead Channel Impedance Value: 741 Ohm
Lead Channel Pacing Threshold Amplitude: 0.4 V
Lead Channel Sensing Intrinsic Amplitude: 1.5 mV
Lead Channel Sensing Intrinsic Amplitude: 18.8 mV
Lead Channel Setting Pacing Amplitude: 0.9 V
Lead Channel Setting Pacing Pulse Width: 0.4 ms
MDC IDC LEAD IMPLANT DT: 20140710
MDC IDC LEAD LOCATION: 753859
MDC IDC LEAD MODEL: 346366
MDC IDC MSMT LEADCHNL RA PACING THRESHOLD PULSEWIDTH: 0.4 ms
MDC IDC MSMT LEADCHNL RV IMPEDANCE VALUE: 761 Ohm
MDC IDC MSMT LEADCHNL RV PACING THRESHOLD AMPLITUDE: 0.3 V
MDC IDC MSMT LEADCHNL RV PACING THRESHOLD PULSEWIDTH: 0.4 ms
MDC IDC PG SERIAL: 66385898
MDC IDC STAT BRADY RV PERCENT PACED: 94 %

## 2016-01-23 MED ORDER — PANTOPRAZOLE SODIUM 40 MG PO TBEC: 40.0000 mg | DELAYED_RELEASE_TABLET | Freq: Two times a day (BID) | ORAL | Status: DC

## 2016-01-23 NOTE — Telephone Encounter (Signed)
Refilled Pantoprazole 

## 2016-01-31 DIAGNOSIS — M6281 Muscle weakness (generalized): Secondary | ICD-10-CM | POA: Diagnosis not present

## 2016-01-31 DIAGNOSIS — G713 Mitochondrial myopathy, not elsewhere classified: Secondary | ICD-10-CM | POA: Diagnosis not present

## 2016-01-31 DIAGNOSIS — R269 Unspecified abnormalities of gait and mobility: Secondary | ICD-10-CM | POA: Diagnosis not present

## 2016-01-31 DIAGNOSIS — R270 Ataxia, unspecified: Secondary | ICD-10-CM | POA: Diagnosis not present

## 2016-02-05 ENCOUNTER — Encounter: Payer: Self-pay | Admitting: Cardiology

## 2016-02-14 DIAGNOSIS — R270 Ataxia, unspecified: Secondary | ICD-10-CM | POA: Diagnosis not present

## 2016-02-14 DIAGNOSIS — R269 Unspecified abnormalities of gait and mobility: Secondary | ICD-10-CM | POA: Diagnosis not present

## 2016-02-14 DIAGNOSIS — M6281 Muscle weakness (generalized): Secondary | ICD-10-CM | POA: Diagnosis not present

## 2016-02-14 DIAGNOSIS — G713 Mitochondrial myopathy, not elsewhere classified: Secondary | ICD-10-CM | POA: Diagnosis not present

## 2016-02-18 ENCOUNTER — Telehealth: Payer: Self-pay | Admitting: Internal Medicine

## 2016-02-18 MED ORDER — HYDROCODONE-ACETAMINOPHEN 10-325 MG PO TABS: ORAL_TABLET | ORAL | Status: DC

## 2016-02-18 NOTE — Telephone Encounter (Signed)
Patient calling to get her Norco 10-325mg  refilled.  Please call when ready to pick it up.    Thank you.

## 2016-02-18 NOTE — Telephone Encounter (Signed)
Refill once 

## 2016-02-19 MED ORDER — ALPRAZOLAM 1 MG PO TABS: 1.0000 mg | ORAL_TABLET | Freq: Three times a day (TID) | ORAL | Status: DC | PRN

## 2016-02-19 NOTE — Telephone Encounter (Signed)
Pt notified prescription is ready to be picked up

## 2016-02-25 ENCOUNTER — Other Ambulatory Visit: Payer: Medicare Other | Admitting: Internal Medicine

## 2016-02-25 DIAGNOSIS — Z1329 Encounter for screening for other suspected endocrine disorder: Secondary | ICD-10-CM

## 2016-02-25 DIAGNOSIS — Z130 Encounter for screening for diseases of the blood and blood-forming organs and certain disorders involving the immune mechanism: Secondary | ICD-10-CM

## 2016-02-25 DIAGNOSIS — E559 Vitamin D deficiency, unspecified: Secondary | ICD-10-CM

## 2016-02-25 DIAGNOSIS — Z0000 Encounter for general adult medical examination without abnormal findings: Secondary | ICD-10-CM | POA: Diagnosis not present

## 2016-02-25 DIAGNOSIS — I10 Essential (primary) hypertension: Secondary | ICD-10-CM | POA: Diagnosis not present

## 2016-02-25 DIAGNOSIS — E785 Hyperlipidemia, unspecified: Secondary | ICD-10-CM

## 2016-02-25 LAB — COMPLETE METABOLIC PANEL WITH GFR
ALBUMIN: 4.2 g/dL (ref 3.6–5.1)
ALK PHOS: 60 U/L (ref 33–130)
ALT: 20 U/L (ref 6–29)
AST: 22 U/L (ref 10–35)
BILIRUBIN TOTAL: 0.5 mg/dL (ref 0.2–1.2)
BUN: 15 mg/dL (ref 7–25)
CO2: 27 mmol/L (ref 20–31)
Calcium: 8.9 mg/dL (ref 8.6–10.4)
Chloride: 104 mmol/L (ref 98–110)
Creat: 0.69 mg/dL (ref 0.60–0.93)
GFR, EST NON AFRICAN AMERICAN: 88 mL/min (ref >=60)
Glucose, Bld: 83 mg/dL (ref 65–99)
Potassium: 4.4 mmol/L (ref 3.5–5.3)
Sodium: 140 mmol/L (ref 135–146)
TOTAL PROTEIN: 6 g/dL — AB (ref 6.1–8.1)

## 2016-02-25 LAB — CBC WITH DIFFERENTIAL/PLATELET
Basophils Absolute: 62 cells/uL (ref 0–200)
Basophils Relative: 1 %
EOS PCT: 4 %
Eosinophils Absolute: 248 cells/uL (ref 15–500)
HCT: 41.1 % (ref 35.0–45.0)
HEMOGLOBIN: 13.5 g/dL (ref 11.7–15.5)
LYMPHS ABS: 2232 {cells}/uL (ref 850–3900)
Lymphocytes Relative: 36 %
MCH: 28.7 pg (ref 27.0–33.0)
MCHC: 32.8 g/dL (ref 32.0–36.0)
MCV: 87.3 fL (ref 80.0–100.0)
MPV: 10.8 fL (ref 7.5–12.5)
Monocytes Absolute: 682 cells/uL (ref 200–950)
Monocytes Relative: 11 %
NEUTROS PCT: 48 %
Neutro Abs: 2976 cells/uL (ref 1500–7800)
PLATELETS: 221 10*3/uL (ref 140–400)
RBC: 4.71 MIL/uL (ref 3.80–5.10)
RDW: 13.9 % (ref 11.0–15.0)
WBC: 6.2 10*3/uL (ref 3.8–10.8)

## 2016-02-25 LAB — LIPID PANEL
Cholesterol: 149 mg/dL (ref 125–200)
HDL: 64 mg/dL (ref >=46)
LDL Cholesterol: 69 mg/dL (ref <130)
TRIGLYCERIDES: 80 mg/dL (ref <150)
Total CHOL/HDL Ratio: 2.3 Ratio (ref <=5.0)
VLDL: 16 mg/dL (ref <30)

## 2016-02-25 LAB — TSH: TSH: 1.24 mIU/L

## 2016-02-26 LAB — VITAMIN D 25 HYDROXY (VIT D DEFICIENCY, FRACTURES): Vit D, 25-Hydroxy: 50 ng/mL (ref 30–100)

## 2016-02-27 ENCOUNTER — Encounter: Payer: Medicare Other | Admitting: Internal Medicine

## 2016-02-28 DIAGNOSIS — R270 Ataxia, unspecified: Secondary | ICD-10-CM | POA: Diagnosis not present

## 2016-02-28 DIAGNOSIS — M6281 Muscle weakness (generalized): Secondary | ICD-10-CM | POA: Diagnosis not present

## 2016-02-28 DIAGNOSIS — G713 Mitochondrial myopathy, not elsewhere classified: Secondary | ICD-10-CM | POA: Diagnosis not present

## 2016-02-28 DIAGNOSIS — R269 Unspecified abnormalities of gait and mobility: Secondary | ICD-10-CM | POA: Diagnosis not present

## 2016-03-02 ENCOUNTER — Ambulatory Visit (INDEPENDENT_AMBULATORY_CARE_PROVIDER_SITE_OTHER): Payer: Medicare Other | Admitting: Internal Medicine

## 2016-03-02 ENCOUNTER — Encounter: Payer: Self-pay | Admitting: Internal Medicine

## 2016-03-02 VITALS — BP 124/74 | Temp 97.9°F | Ht 70.0 in | Wt 167.0 lb

## 2016-03-02 DIAGNOSIS — F418 Other specified anxiety disorders: Secondary | ICD-10-CM

## 2016-03-02 DIAGNOSIS — R531 Weakness: Secondary | ICD-10-CM

## 2016-03-02 DIAGNOSIS — M797 Fibromyalgia: Secondary | ICD-10-CM | POA: Diagnosis not present

## 2016-03-02 DIAGNOSIS — Z950 Presence of cardiac pacemaker: Secondary | ICD-10-CM | POA: Diagnosis not present

## 2016-03-02 DIAGNOSIS — Z87440 Personal history of urinary (tract) infections: Secondary | ICD-10-CM

## 2016-03-02 DIAGNOSIS — K219 Gastro-esophageal reflux disease without esophagitis: Secondary | ICD-10-CM

## 2016-03-02 DIAGNOSIS — Z0000 Encounter for general adult medical examination without abnormal findings: Secondary | ICD-10-CM

## 2016-03-02 DIAGNOSIS — Z658 Other specified problems related to psychosocial circumstances: Secondary | ICD-10-CM

## 2016-03-02 DIAGNOSIS — G729 Myopathy, unspecified: Secondary | ICD-10-CM | POA: Diagnosis not present

## 2016-03-02 DIAGNOSIS — I341 Nonrheumatic mitral (valve) prolapse: Secondary | ICD-10-CM

## 2016-03-02 DIAGNOSIS — F419 Anxiety disorder, unspecified: Secondary | ICD-10-CM

## 2016-03-02 DIAGNOSIS — F32A Depression, unspecified: Secondary | ICD-10-CM

## 2016-03-02 DIAGNOSIS — J309 Allergic rhinitis, unspecified: Secondary | ICD-10-CM

## 2016-03-02 DIAGNOSIS — G713 Mitochondrial myopathy, not elsewhere classified: Secondary | ICD-10-CM

## 2016-03-02 DIAGNOSIS — F329 Major depressive disorder, single episode, unspecified: Secondary | ICD-10-CM | POA: Diagnosis not present

## 2016-03-02 DIAGNOSIS — F439 Reaction to severe stress, unspecified: Secondary | ICD-10-CM

## 2016-03-02 LAB — POCT URINALYSIS DIPSTICK
BILIRUBIN UA: NEGATIVE
Blood, UA: NEGATIVE
Glucose, UA: NEGATIVE
Ketones, UA: NEGATIVE
NITRITE UA: POSITIVE
Protein, UA: NEGATIVE
Spec Grav, UA: <=1.005
Urobilinogen, UA: 0.2
pH, UA: 8

## 2016-03-02 MED ORDER — BUPROPION HCL ER (XL) 300 MG PO TB24: 300.0000 mg | ORAL_TABLET | Freq: Every day | ORAL | 0 refills | Status: DC

## 2016-03-04 ENCOUNTER — Telehealth: Payer: Self-pay

## 2016-03-04 LAB — URINE CULTURE

## 2016-03-04 MED ORDER — AMOXICILLIN-POT CLAVULANATE 500-125 MG PO TABS: 1.0000 | ORAL_TABLET | Freq: Two times a day (BID) | ORAL | 0 refills | Status: DC

## 2016-03-04 NOTE — Telephone Encounter (Signed)
Patient called in. Pt was given lab results and med instructions. Called in Augmentin 500mg  bid to local pharmacy.

## 2016-03-04 NOTE — Telephone Encounter (Signed)
-----   Message from Elby Showers, MD sent at 03/04/2016  3:35 PM EDT ----- Pt has UTI. Please call in Augmentin 500 mg twice a day with food for 10 days and have her return in 2 weeks for nurse visit and check urine. Please call pt. With results.
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
## 2016-03-07 NOTE — Patient Instructions (Signed)
Finish course of Augmentin for UTI and return August 8. Start Wellbutrin in follow-up in late August. May need to have med consultation with psychiatrist.

## 2016-03-16 ENCOUNTER — Ambulatory Visit (INDEPENDENT_AMBULATORY_CARE_PROVIDER_SITE_OTHER): Payer: Medicare Other | Admitting: *Deleted

## 2016-03-16 DIAGNOSIS — I495 Sick sinus syndrome: Secondary | ICD-10-CM | POA: Diagnosis not present

## 2016-03-16 NOTE — Progress Notes (Signed)
Remote pacemaker transmission.   

## 2016-03-17 ENCOUNTER — Encounter: Payer: Self-pay | Admitting: Internal Medicine

## 2016-03-17 ENCOUNTER — Ambulatory Visit (INDEPENDENT_AMBULATORY_CARE_PROVIDER_SITE_OTHER): Payer: Medicare Other | Admitting: Internal Medicine

## 2016-03-17 VITALS — BP 122/64 | HR 62 | Temp 97.2°F

## 2016-03-17 DIAGNOSIS — N390 Urinary tract infection, site not specified: Secondary | ICD-10-CM | POA: Diagnosis not present

## 2016-03-17 LAB — POCT URINALYSIS DIPSTICK
Bilirubin, UA: NEGATIVE
Glucose, UA: NEGATIVE
Ketones, UA: NEGATIVE
Leukocytes, UA: NEGATIVE
NITRITE UA: NEGATIVE
PROTEIN UA: NEGATIVE
RBC UA: NEGATIVE
SPEC GRAV UA: 1.01
UROBILINOGEN UA: 0.2
pH, UA: 5

## 2016-03-18 ENCOUNTER — Encounter: Payer: Self-pay | Admitting: Cardiology

## 2016-03-20 DIAGNOSIS — M6281 Muscle weakness (generalized): Secondary | ICD-10-CM | POA: Diagnosis not present

## 2016-03-20 DIAGNOSIS — R270 Ataxia, unspecified: Secondary | ICD-10-CM | POA: Diagnosis not present

## 2016-03-20 DIAGNOSIS — R269 Unspecified abnormalities of gait and mobility: Secondary | ICD-10-CM | POA: Diagnosis not present

## 2016-03-20 DIAGNOSIS — G713 Mitochondrial myopathy, not elsewhere classified: Secondary | ICD-10-CM | POA: Diagnosis not present

## 2016-03-24 LAB — CUP PACEART REMOTE DEVICE CHECK
Date Time Interrogation Session: 20170815092355
Implantable Lead Implant Date: 20140710
Implantable Lead Location: 753859
Implantable Lead Model: 346366
Implantable Lead Model: 3463689
Implantable Lead Serial Number: 29334372
Lead Channel Impedance Value: 663 Ohm
Lead Channel Impedance Value: 800 Ohm
Lead Channel Pacing Threshold Pulse Width: 0.4 ms
Lead Channel Pacing Threshold Pulse Width: 0.4 ms
Lead Channel Sensing Intrinsic Amplitude: 24 mV
Lead Channel Sensing Intrinsic Amplitude: 3.4 mV
Lead Channel Setting Pacing Amplitude: 0.9 V
MDC IDC LEAD IMPLANT DT: 20140710
MDC IDC LEAD LOCATION: 753860
MDC IDC LEAD SERIAL: 29426855
MDC IDC MSMT LEADCHNL RA PACING THRESHOLD AMPLITUDE: 0.6 V
MDC IDC MSMT LEADCHNL RV PACING THRESHOLD AMPLITUDE: 0.4 V
MDC IDC SET LEADCHNL RA PACING AMPLITUDE: 1.7 V
MDC IDC SET LEADCHNL RV PACING PULSEWIDTH: 0.4 ms
MDC IDC STAT BRADY RA PERCENT PACED: 60 %
MDC IDC STAT BRADY RV PERCENT PACED: 95 %
Pulse Gen Serial Number: 66385898

## 2016-03-27 DIAGNOSIS — M6281 Muscle weakness (generalized): Secondary | ICD-10-CM | POA: Diagnosis not present

## 2016-03-27 DIAGNOSIS — R269 Unspecified abnormalities of gait and mobility: Secondary | ICD-10-CM | POA: Diagnosis not present

## 2016-03-27 DIAGNOSIS — G713 Mitochondrial myopathy, not elsewhere classified: Secondary | ICD-10-CM | POA: Diagnosis not present

## 2016-03-27 DIAGNOSIS — R270 Ataxia, unspecified: Secondary | ICD-10-CM | POA: Diagnosis not present

## 2016-03-31 ENCOUNTER — Other Ambulatory Visit: Payer: Self-pay

## 2016-03-31 NOTE — Telephone Encounter (Signed)
Pt never returned for follow up om UTI from July s/p treatment. Please make appt for Thursday and can refill Norco then. She is 2 days early for Rx to be filled.

## 2016-03-31 NOTE — Telephone Encounter (Signed)
Spoke to patient.  Gave instructions. Scheduled appt.

## 2016-04-01 ENCOUNTER — Telehealth: Payer: Self-pay | Admitting: Internal Medicine

## 2016-04-01 ENCOUNTER — Encounter: Payer: Self-pay | Admitting: Internal Medicine

## 2016-04-01 MED ORDER — HYDROCODONE-ACETAMINOPHEN 10-325 MG PO TABS: 1.0000 | ORAL_TABLET | Freq: Two times a day (BID) | ORAL | 0 refills | Status: DC

## 2016-04-01 NOTE — Telephone Encounter (Addendum)
Patient requesting refill of hydrocodone/APAP 10/325. We thought she had filled it late July but it looks like it was refilled July 11. Notes indicate she got 30 hydrocodone/APAP tablets from Dr. Doy Mince 02/10/2016. Going to go ahead and refill it now when she will pickup written prescription tomorrow. She has appointment next week for follow-up on UTI and to discuss depression.  Written prescription hydrocodone/APAP 10/325 #60 no refill 1 by mouth every 12 hours when necessary pain

## 2016-04-02 ENCOUNTER — Other Ambulatory Visit: Payer: Self-pay

## 2016-04-02 ENCOUNTER — Ambulatory Visit: Payer: Self-pay | Admitting: Internal Medicine

## 2016-04-02 MED ORDER — BUPROPION HCL ER (XL) 300 MG PO TB24: 300.0000 mg | ORAL_TABLET | Freq: Every day | ORAL | 3 refills | Status: DC

## 2016-04-03 ENCOUNTER — Encounter: Payer: Self-pay | Admitting: Cardiology

## 2016-04-03 DIAGNOSIS — R269 Unspecified abnormalities of gait and mobility: Secondary | ICD-10-CM | POA: Diagnosis not present

## 2016-04-03 DIAGNOSIS — R270 Ataxia, unspecified: Secondary | ICD-10-CM | POA: Diagnosis not present

## 2016-04-03 DIAGNOSIS — M6281 Muscle weakness (generalized): Secondary | ICD-10-CM | POA: Diagnosis not present

## 2016-04-03 DIAGNOSIS — G713 Mitochondrial myopathy, not elsewhere classified: Secondary | ICD-10-CM | POA: Diagnosis not present

## 2016-04-03 NOTE — Progress Notes (Signed)
Letter  

## 2016-04-07 ENCOUNTER — Encounter: Payer: Self-pay | Admitting: Internal Medicine

## 2016-04-07 ENCOUNTER — Ambulatory Visit (INDEPENDENT_AMBULATORY_CARE_PROVIDER_SITE_OTHER): Payer: Medicare Other | Admitting: Internal Medicine

## 2016-04-07 VITALS — BP 120/82 | HR 86 | Temp 97.9°F | Ht 70.0 in | Wt 166.0 lb

## 2016-04-07 DIAGNOSIS — F329 Major depressive disorder, single episode, unspecified: Secondary | ICD-10-CM | POA: Diagnosis not present

## 2016-04-07 DIAGNOSIS — G4700 Insomnia, unspecified: Secondary | ICD-10-CM

## 2016-04-07 DIAGNOSIS — G729 Myopathy, unspecified: Secondary | ICD-10-CM

## 2016-04-07 DIAGNOSIS — M797 Fibromyalgia: Secondary | ICD-10-CM

## 2016-04-07 DIAGNOSIS — R8290 Unspecified abnormal findings in urine: Secondary | ICD-10-CM

## 2016-04-07 DIAGNOSIS — G713 Mitochondrial myopathy, not elsewhere classified: Secondary | ICD-10-CM

## 2016-04-07 DIAGNOSIS — Z0000 Encounter for general adult medical examination without abnormal findings: Secondary | ICD-10-CM | POA: Diagnosis not present

## 2016-04-07 DIAGNOSIS — F32A Depression, unspecified: Secondary | ICD-10-CM

## 2016-04-07 LAB — POCT URINALYSIS DIPSTICK
BILIRUBIN UA: NEGATIVE
GLUCOSE UA: NEGATIVE
Ketones, UA: NEGATIVE
NITRITE UA: NEGATIVE
Protein, UA: NEGATIVE
RBC UA: NEGATIVE
Urobilinogen, UA: 0.2
pH, UA: 7.5

## 2016-04-07 MED ORDER — QUETIAPINE FUMARATE 50 MG PO TABS: 50.0000 mg | ORAL_TABLET | Freq: Every day | ORAL | 0 refills | Status: DC

## 2016-04-08 NOTE — Progress Notes (Signed)
   Subjective:    Patient ID: Wendy Rose, female    DOB: 1946/04/21, 70 y.o.   MRN: [id number]  HPI 70 year old Female in today for follow-up on fatigue and depression. She is on SSRI and Wellbutrin. Maybe feels a bit better. Doesn't seem quite is down today. Still has issues with energy related to mitochondrial myopathy. Husband apparently has to have some upcoming hernia surgery. She's looking forward for them taking very annual vacation for a month at the beach in October if his surgery will allow it. She is not sleeping well. Says she probably should take her Xanax more regularly. Not taking it 3 times a day. Doesn't fall asleep until 3 AM and sleeps until noon.  She also is here to follow-up on UTI. Urinalysis is abnormal and urine was recultured. She feels like she keeps a UTI all the time.  Also her hydrocodone/APAP is been refilled for fibromyalgia pain.    Review of Systems see above     Objective:   Physical Exam  Spent 25 minutes speaking with her about these issues. Urinalysis dipstick recorded in Epic. Culture sent. We are going to wait on culture results before treating her.      Assessment & Plan:  Abnormal urinalysis  History of recurrent urinary infections  Insomnia  Mitochondrial myopathy  Depression  Situational stress with husband  History of fibromyalgia  Plan: Hydrocodone/APAP refilled. Urine culture pending. Add Seroquel 50 mg at bedtime to current medications. Has follow-up appointment here in 3 weeks.

## 2016-04-08 NOTE — Patient Instructions (Addendum)
Add Seroquel 50 mg at bedtime to current medications. Urine culture pending. Has follow-up appointment here in 3 weeks

## 2016-04-09 ENCOUNTER — Telehealth: Payer: Self-pay

## 2016-04-09 LAB — URINE CULTURE: Organism ID, Bacteria: 10000

## 2016-04-09 NOTE — Telephone Encounter (Signed)
-----   Message from Elby Showers, MD sent at 04/09/2016 12:57 AM EDT ----- Culture has no significant growth. Please call pt. No antibiotics at this time. Appears to have been a contaminant.

## 2016-04-09 NOTE — Telephone Encounter (Signed)
Called patient. Gave lab results. Patient verbalized understanding.  

## 2016-04-28 ENCOUNTER — Ambulatory Visit: Payer: Medicare Other | Admitting: Internal Medicine

## 2016-04-30 ENCOUNTER — Encounter: Payer: Self-pay | Admitting: Internal Medicine

## 2016-04-30 ENCOUNTER — Ambulatory Visit (INDEPENDENT_AMBULATORY_CARE_PROVIDER_SITE_OTHER): Payer: Medicare Other | Admitting: Internal Medicine

## 2016-04-30 VITALS — BP 124/88 | HR 79 | Temp 97.9°F | Wt 166.0 lb

## 2016-04-30 DIAGNOSIS — M797 Fibromyalgia: Secondary | ICD-10-CM

## 2016-04-30 DIAGNOSIS — N390 Urinary tract infection, site not specified: Secondary | ICD-10-CM

## 2016-04-30 DIAGNOSIS — G729 Myopathy, unspecified: Secondary | ICD-10-CM

## 2016-04-30 DIAGNOSIS — F329 Major depressive disorder, single episode, unspecified: Secondary | ICD-10-CM

## 2016-04-30 DIAGNOSIS — F439 Reaction to severe stress, unspecified: Secondary | ICD-10-CM

## 2016-04-30 DIAGNOSIS — F32A Depression, unspecified: Secondary | ICD-10-CM

## 2016-04-30 DIAGNOSIS — Z658 Other specified problems related to psychosocial circumstances: Secondary | ICD-10-CM

## 2016-04-30 DIAGNOSIS — G713 Mitochondrial myopathy, not elsewhere classified: Secondary | ICD-10-CM

## 2016-04-30 LAB — POCT URINALYSIS DIPSTICK
BILIRUBIN UA: NEGATIVE
GLUCOSE UA: NEGATIVE
Ketones, UA: NEGATIVE
Leukocytes, UA: NEGATIVE
NITRITE UA: NEGATIVE
Protein, UA: NEGATIVE
Spec Grav, UA: <=1.005
Urobilinogen, UA: NEGATIVE
pH, UA: 7.5

## 2016-04-30 MED ORDER — FLUOXETINE HCL 40 MG PO CAPS
40.0000 mg | ORAL_CAPSULE | Freq: Every day | ORAL | 3 refills | Status: DC
Start: 2016-04-30 — End: 2017-05-21

## 2016-05-04 NOTE — Progress Notes (Signed)
   Subjective:    Patient ID: Wendy Rose, female    DOB: [birth year], 70 y.o.   MRN: [id number]  HPI 70 year old Female in today to follow-up on urinary tract infection and depression issues. Husband continues to be chronically ill and in the hospital a great deal. She and her husband will not be able to spend the month of October at the beach as she had hoped. She seems to have constant anxiety about his health because they spend a lot of time at [place] and he is in the hospital a fair amount.  She has a history of mitochondrial myopathy followed by Dr. Doy Mince  at [facility]  She goes to physical therapy for this. We've tried her on Wellbutrin recently. Seems not to enjoy very much of anything right now I think due to husband's illness and situational stress. I recommended she see psychiatrist.  3 weeks ago she had abnormal urine dipstick with 2+ LE but urine culture had no significant growth. Urine specimen today is normal. Had Escherichia coli UTI in July. This resolved with treatment and was checked August 8.  History of fibromyalgia syndrome and is on chronic narcotic pain medication which she takes fairly sparingly.    Review of Systems as above     Objective:   Physical Exam  Not examined today. Spent 25 minutes speaking with her about situational stress and depression. Urinalysis by dipstick is normal.      Assessment & Plan:  No evidence of UTI  Chronic anxiety and depression  Situational stress  Fibromyalgia syndrome  Mitochondrial myopathy  Depression  Plan: Patient needs to be seen daily days due to narcotic pain management. Return in December.

## 2016-05-04 NOTE — Patient Instructions (Signed)
Consider psychiatric consultation. Continue same medications. No evidence of UTI today. Return in December.

## 2016-05-08 DIAGNOSIS — R270 Ataxia, unspecified: Secondary | ICD-10-CM | POA: Diagnosis not present

## 2016-05-08 DIAGNOSIS — G713 Mitochondrial myopathy, not elsewhere classified: Secondary | ICD-10-CM | POA: Diagnosis not present

## 2016-05-08 DIAGNOSIS — R269 Unspecified abnormalities of gait and mobility: Secondary | ICD-10-CM | POA: Diagnosis not present

## 2016-05-08 DIAGNOSIS — M6281 Muscle weakness (generalized): Secondary | ICD-10-CM | POA: Diagnosis not present

## 2016-05-14 ENCOUNTER — Other Ambulatory Visit: Payer: Self-pay | Admitting: Cardiovascular Disease

## 2016-05-20 ENCOUNTER — Other Ambulatory Visit: Payer: Self-pay

## 2016-05-21 ENCOUNTER — Ambulatory Visit (INDEPENDENT_AMBULATORY_CARE_PROVIDER_SITE_OTHER): Payer: Medicare Other | Admitting: Internal Medicine

## 2016-05-21 ENCOUNTER — Encounter: Payer: Self-pay | Admitting: Internal Medicine

## 2016-05-21 VITALS — BP 104/60 | HR 80 | Ht 70.0 in | Wt 164.8 lb

## 2016-05-21 DIAGNOSIS — I484 Atypical atrial flutter: Secondary | ICD-10-CM

## 2016-05-21 DIAGNOSIS — I495 Sick sinus syndrome: Secondary | ICD-10-CM

## 2016-05-21 DIAGNOSIS — Z950 Presence of cardiac pacemaker: Secondary | ICD-10-CM

## 2016-05-21 NOTE — Progress Notes (Signed)
PCP: Elby Showers, MD Primary Cardiologist:  Wendy Rose Wendy Rose is a 70 y.o. female who presents today for routine electrophysiology followup.  She is doing well from CV standpoint.  She has mitochondrial disease for which she follows at [facility].  Todays review of systems reveals hearing loss, visual changes, depression, back/ muscle pain, bruising, sweating, fatigue, leg pain, palpitations, anxiety, headaches, and balance unsteadiness. Today, she denies symptoms of chest pain,  lower extremity edema, dizziness, presyncope, or syncope.  The patient is otherwise without complaint today.   Past Medical History:  Diagnosis Date  . Anemia   . Anxiety   . Arthritis   . Chronic headaches   . Depression   . Fibromyalgia   . GERD (gastroesophageal reflux disease)   . Hyperlipemia   . IBS (irritable bowel syndrome)   . MVP (mitral valve prolapse)   . Pneumonia   . PONV (postoperative nausea and vomiting)   . S/P cardiac pacemaker procedure, 02/16/13, Bio tronik device 02/19/2013  . Symptomatic bradycardia    s/p Biotronik Evia dual chamber pacemaker implant 02/16/2013 by Wendy Rayann Heman  . UTI (lower urinary tract infection)   . Vertigo    Past Surgical History:  Procedure Laterality Date  . BLADDER SUSPENSION    . CESAREAN SECTION    . DILATION AND CURETTAGE OF UTERUS    . PACEMAKER INSERTION  02/16/2013   Biotronik Evia dual chamber pacemaker implanted by Wendy Rayann Heman  . PERMANENT PACEMAKER INSERTION N/A 02/16/2013   Procedure: PERMANENT PACEMAKER INSERTION;  Surgeon: Thompson Grayer, MD;  Location: [facility] CATH LAB;  Service: Cardiovascular;  Laterality: N/A;  . TONSILLECTOMY AND ADENOIDECTOMY    . TOTAL ABDOMINAL HYSTERECTOMY    . TYMPANOPLASTY      Current Outpatient Prescriptions  Medication Sig Dispense Refill  . ALPRAZolam (XANAX) 1 MG tablet Take 1 tablet (1 mg total) by mouth 3 (three) times daily as needed for anxiety. 60 tablet 5  . atorvastatin (LIPITOR) 20 MG tablet take 1 tablet by  mouth once daily NEED APPOINTMENT 90 tablet 0  . B Complex-C (B-COMPLEX WITH VITAMIN C) tablet Take 1 tablet by mouth daily.    . calcium-vitamin D (CALCIUM 500+D) 500-200 MG-UNIT per tablet Take 1 tablet by mouth 2 (two) times daily.     Marland Kitchen co-enzyme Q-10 50 MG capsule Take 50 mg by mouth 2 (two) times daily.    . Creatinine POWD Take by mouth as directed.     Marland Kitchen ELIQUIS 5 MG TABS tablet take 1 tablet by mouth twice a day 60 tablet 11  . FLUoxetine (PROZAC) 40 MG capsule Take 1 capsule (40 mg total) by mouth daily. 90 capsule 3  . HYDROcodone-acetaminophen (NORCO) 10-325 MG tablet Take 1 tablet by mouth every 12 (twelve) hours. 60 tablet 0  . hydrocortisone 2.5 % cream Apply 1 application topically 2 (two) times daily.    Marland Kitchen lidocaine (LIDODERM) 5 % apply 1 to 3 patches to affected area 12 HOURS OUT OF 24 HOURS 90 patch 5  . Magnesium Oxide (MAG-200 PO) Take 1 tablet by mouth 2 (two) times daily.     . ondansetron (ZOFRAN) 4 MG tablet Take 1 tablet (4 mg total) by mouth every 8 (eight) hours as needed for nausea or vomiting. 20 tablet 0  . pantoprazole (PROTONIX) 40 MG tablet Take 1 tablet (40 mg total) by mouth 2 (two) times daily. 60 tablet 3  . QUEtiapine (SEROQUEL) 50 MG tablet Take 1 tablet (50 mg  total) by mouth at bedtime. 30 tablet 0  . traMADol (ULTRAM-ER) 100 MG 24 hr tablet Take 100 mg by mouth 2 (two) times daily.     Marland Kitchen triamcinolone cream (KENALOG) 0.1 % Apply 1 application topically 2 (two) times daily. 30 g 0  . buPROPion (WELLBUTRIN XL) 150 MG 24 hr tablet Pt unsure of strength. Will call us when she gets home (05/21/16)  0  . buPROPion (WELLBUTRIN XL) 300 MG 24 hr tablet Take 1 tablet (300 mg total) by mouth daily. 90 tablet 3   No current facility-administered medications for this visit.     Physical Exam: Vitals:   05/21/16 1513  BP: 104/60  Pulse: 80  Weight: 164 lb 12.8 oz (74.8 kg)  Height: 5\' 10"  (1.778 m)    GEN- The patient is well appearing, alert and oriented x  3 today.   Head- normocephalic, atraumatic Eyes-  Sclera clear, conjunctiva pink Ears- hearing intact Oropharynx- clear Lungs- Clear to ausculation bilaterally, normal work of breathing Chest- pacemaker pocket is well healed Heart- Regular rate and rhythm, no murmurs, rubs or gallops, PMI not laterally displaced GI- soft, NT, ND, + BS Extremities- no clubbing, cyanosis, or edema  Pacemaker interrogation- reviewed in detail today,  See PACEART report.    Assessment and Plan:  1. Sinus bradycardia/ sick sinus syndrome Normal pacemaker function See Pace Art report No changes today  2. Asymptomatic atypical aflutter/stroke risk Continue  Eliquis 5 mg bid  Asymptomatic and low burden Will continue to follow  Remotes Return to see EP NP in 1 year Follow-up with Wendy Claiborne Billings when needed  Thompson Grayer MD, [facility] 05/21/2016 3:31 PM

## 2016-05-21 NOTE — Patient Instructions (Signed)
Medication Instructions:  .isntc   Labwork: None ordered   Testing/Procedures: None ordered   Follow-Up: Remote monitoring is used to monitor your Pacemaker  from home. This monitoring reduces the number of office visits required to check your device to one time per year. It allows [place] to keep an eye on the functioning of your device to ensure it is working properly. You are scheduled for a device check from home on 08/19/16. You may send your transmission at any time that day. If you have a wireless device, the transmission will be sent automatically. After your physician reviews your transmission, you will receive a postcard with your next transmission date.    Your physician wants you to follow-up in: 12 months with Dr Vallery Ridge will receive a reminder letter in the mail two months in advance. If you don't receive a letter, please call our office to schedule the follow-up appointment.   Any Other Special Instructions Will Be Listed Below (If Applicable).     If you need a refill on your cardiac medications before your next appointment, please call your pharmacy.

## 2016-05-29 ENCOUNTER — Ambulatory Visit (INDEPENDENT_AMBULATORY_CARE_PROVIDER_SITE_OTHER): Payer: Medicare Other | Admitting: Internal Medicine

## 2016-05-29 ENCOUNTER — Encounter: Payer: Self-pay | Admitting: Internal Medicine

## 2016-05-29 VITALS — BP 142/78 | HR 89 | Wt 165.0 lb

## 2016-05-29 DIAGNOSIS — R269 Unspecified abnormalities of gait and mobility: Secondary | ICD-10-CM | POA: Diagnosis not present

## 2016-05-29 DIAGNOSIS — R270 Ataxia, unspecified: Secondary | ICD-10-CM | POA: Diagnosis not present

## 2016-05-29 DIAGNOSIS — G44221 Chronic tension-type headache, intractable: Secondary | ICD-10-CM

## 2016-05-29 DIAGNOSIS — M6281 Muscle weakness (generalized): Secondary | ICD-10-CM | POA: Diagnosis not present

## 2016-05-29 DIAGNOSIS — H8110 Benign paroxysmal vertigo, unspecified ear: Secondary | ICD-10-CM | POA: Diagnosis not present

## 2016-05-29 DIAGNOSIS — G713 Mitochondrial myopathy, not elsewhere classified: Secondary | ICD-10-CM | POA: Diagnosis not present

## 2016-05-29 MED ORDER — METHYLPREDNISOLONE 4 MG PO TABS: ORAL_TABLET | ORAL | 0 refills | Status: DC

## 2016-05-29 MED ORDER — AMOXICILLIN 500 MG PO CAPS: 500.0000 mg | ORAL_CAPSULE | Freq: Three times a day (TID) | ORAL | 0 refills | Status: DC

## 2016-05-29 NOTE — Progress Notes (Signed)
   Subjective:    Patient ID: Wendy Rose, female    DOB: 04/03/1946, 70 y.o.   MRN: [id number]  HPI  70 year old Female walked in with 3 day history of headache and dizziness.Symptoms started after she bent over a sink in her home to wash her hair. When she stood back up she felt nauseated and dizzy. Subsequently developed a headache that will not go away. She's tried hydrocodone/APAP which she uses for chronic pain and this does dull the headache but it does not completely go away. She's had no fever. No chills. She goes to physical therapy regularly for mitochondrial myopathy. Physical therapist worked on her neck today and said it was tight. I have suggested she try ice on her neck to break spasm.    Review of Systems as above. No vomiting. No visual disturbances. She says her ears a been popping some.     Objective:   Physical Exam Left fundus benign. Right fundus not seen because of cataract. Extraocular movements are full. No significant nystagmus. TMs are clear. Pharynx is clear. Neck is supple. She has palpable muscle spasm bilaterally in the SCM muscles. Chest clear to auscultation. Muscle strength is within normal limits. She ambulates with a cane and has some difficulty moving about due to mitochondrial myopathy and weakness.       Assessment & Plan:  Benign positional vertigo  Headache  Musculoskeletal neck pain  Mitochondrial myopathy  Plan: She is to continue taking over-the-counter meclizine for dizziness. She will take Medrol 4 mg and tapering course starting with 24 mg decreasing to 0 mg over 7 days. This should help headache. Amoxicillin 500 mg 3 times daily for 10 days. She is to call Monday with progress report. She is to call sooner if worse.

## 2016-05-29 NOTE — Patient Instructions (Signed)
Medrol to be taken in tapering course starting with 6 tablets and decreasing by 1 tablet daily over 7 days. Amoxicillin 500 mg 3 times daily for 10 days. Continue meclizine over-the-counter. Rest. Call with progress report on Monday or sooner if worse.

## 2016-06-05 ENCOUNTER — Other Ambulatory Visit: Payer: Self-pay | Admitting: *Deleted

## 2016-06-05 DIAGNOSIS — R269 Unspecified abnormalities of gait and mobility: Secondary | ICD-10-CM | POA: Diagnosis not present

## 2016-06-05 DIAGNOSIS — G713 Mitochondrial myopathy, not elsewhere classified: Secondary | ICD-10-CM | POA: Diagnosis not present

## 2016-06-05 DIAGNOSIS — M6281 Muscle weakness (generalized): Secondary | ICD-10-CM | POA: Diagnosis not present

## 2016-06-05 DIAGNOSIS — R270 Ataxia, unspecified: Secondary | ICD-10-CM | POA: Diagnosis not present

## 2016-06-05 MED ORDER — HYDROCODONE-ACETAMINOPHEN 10-325 MG PO TABS: 1.0000 | ORAL_TABLET | Freq: Two times a day (BID) | ORAL | 0 refills | Status: DC

## 2016-06-05 NOTE — Telephone Encounter (Signed)
Patient came into the office for refill of Hydrocodone.

## 2016-06-12 DIAGNOSIS — R269 Unspecified abnormalities of gait and mobility: Secondary | ICD-10-CM | POA: Diagnosis not present

## 2016-06-12 DIAGNOSIS — G713 Mitochondrial myopathy, not elsewhere classified: Secondary | ICD-10-CM | POA: Diagnosis not present

## 2016-06-12 DIAGNOSIS — M6281 Muscle weakness (generalized): Secondary | ICD-10-CM | POA: Diagnosis not present

## 2016-06-12 DIAGNOSIS — R270 Ataxia, unspecified: Secondary | ICD-10-CM | POA: Diagnosis not present

## 2016-06-16 DIAGNOSIS — M6281 Muscle weakness (generalized): Secondary | ICD-10-CM | POA: Diagnosis not present

## 2016-06-16 DIAGNOSIS — G713 Mitochondrial myopathy, not elsewhere classified: Secondary | ICD-10-CM | POA: Diagnosis not present

## 2016-06-16 DIAGNOSIS — R269 Unspecified abnormalities of gait and mobility: Secondary | ICD-10-CM | POA: Diagnosis not present

## 2016-06-16 DIAGNOSIS — R270 Ataxia, unspecified: Secondary | ICD-10-CM | POA: Diagnosis not present

## 2016-06-22 ENCOUNTER — Encounter: Payer: Self-pay | Admitting: Internal Medicine

## 2016-06-22 ENCOUNTER — Ambulatory Visit (INDEPENDENT_AMBULATORY_CARE_PROVIDER_SITE_OTHER): Payer: Medicare Other | Admitting: Internal Medicine

## 2016-06-22 VITALS — BP 146/82 | HR 63 | Temp 97.6°F | Ht 70.0 in | Wt 165.0 lb

## 2016-06-22 DIAGNOSIS — G713 Mitochondrial myopathy, not elsewhere classified: Secondary | ICD-10-CM

## 2016-06-22 DIAGNOSIS — F439 Reaction to severe stress, unspecified: Secondary | ICD-10-CM

## 2016-06-22 DIAGNOSIS — F418 Other specified anxiety disorders: Secondary | ICD-10-CM | POA: Diagnosis not present

## 2016-06-22 DIAGNOSIS — F419 Anxiety disorder, unspecified: Secondary | ICD-10-CM

## 2016-06-22 DIAGNOSIS — G44209 Tension-type headache, unspecified, not intractable: Secondary | ICD-10-CM

## 2016-06-22 DIAGNOSIS — M797 Fibromyalgia: Secondary | ICD-10-CM | POA: Diagnosis not present

## 2016-06-22 DIAGNOSIS — F329 Major depressive disorder, single episode, unspecified: Secondary | ICD-10-CM

## 2016-06-22 MED ORDER — METHYLPREDNISOLONE 4 MG PO TABS: ORAL_TABLET | ORAL | 0 refills | Status: DC

## 2016-06-22 NOTE — Progress Notes (Signed)
   Subjective:    Patient ID: Wendy Rose, female    DOB: 1946/07/25, 70 y.o.   MRN: [id number]  HPI Was here  October 10th for protracted headache and dizziness. Neurological exam was intact.Was treated with steroid dosepak with success. Was able to go to her class reunion. Says she has had tinnitus for about a year. This is news to me. Complains of muti- joint pain for which she takes hydrocodone/APAP. Has appt. to see Dr. Doy Mince, Neurologist at [facility] in January. 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 Had CT of brain 2016 showing new left middle ear and mastoid opacification. 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 Very overwhelmed with husband's illness. He is to have surgery the day after Thanksgiving. He has heart issues and will be followed closely by cardiology. She's been having to take him to lots of doctors appointments which is been stressful. She continues to go to physical therapy.     Review of Systems see above     Objective:   Physical Exam  Constitutional: She is oriented to person, place, and time.  HENT:  Head: Normocephalic and atraumatic.  Right Ear: External ear normal.  Mouth/Throat: No oropharyngeal exudate.  rupture left TM- not new  Neck: Neck supple. No thyromegaly present.  Pulmonary/Chest: She has no wheezes. She has no rales.  Neurological: She is alert and oriented to person, place, and time. She has normal reflexes. No cranial nerve deficit. Coordination normal.  Psychiatric: She has a normal mood and affect. Her behavior is normal. Judgment and thought content normal.          Assessment & Plan:  Protracted headache-Suspect muscle contraction headache  Situational stress  Anxiety depression  Mitochondrial myopathy  25 minutes spent with patient  Plan: Medrol 4 mg  6 day dosepack to take as directed in tapering course

## 2016-07-04 NOTE — Patient Instructions (Signed)
Take steroid dosepak in tapering course as prescribed. Call if not better in 48 hours or sooner if worse. Follow-up with neurologist in January.

## 2016-07-10 DIAGNOSIS — M6281 Muscle weakness (generalized): Secondary | ICD-10-CM | POA: Diagnosis not present

## 2016-07-10 DIAGNOSIS — R269 Unspecified abnormalities of gait and mobility: Secondary | ICD-10-CM | POA: Diagnosis not present

## 2016-07-10 DIAGNOSIS — G713 Mitochondrial myopathy, not elsewhere classified: Secondary | ICD-10-CM | POA: Diagnosis not present

## 2016-07-10 DIAGNOSIS — R270 Ataxia, unspecified: Secondary | ICD-10-CM | POA: Diagnosis not present

## 2016-07-14 ENCOUNTER — Encounter: Payer: Self-pay | Admitting: Internal Medicine

## 2016-07-14 ENCOUNTER — Ambulatory Visit (INDEPENDENT_AMBULATORY_CARE_PROVIDER_SITE_OTHER): Payer: Medicare Other | Admitting: Internal Medicine

## 2016-07-14 VITALS — BP 146/78 | HR 84 | Temp 98.4°F | Wt 164.0 lb

## 2016-07-14 DIAGNOSIS — F32A Depression, unspecified: Secondary | ICD-10-CM

## 2016-07-14 DIAGNOSIS — G44209 Tension-type headache, unspecified, not intractable: Secondary | ICD-10-CM | POA: Diagnosis not present

## 2016-07-14 DIAGNOSIS — F439 Reaction to severe stress, unspecified: Secondary | ICD-10-CM

## 2016-07-14 DIAGNOSIS — M797 Fibromyalgia: Secondary | ICD-10-CM

## 2016-07-14 DIAGNOSIS — F329 Major depressive disorder, single episode, unspecified: Secondary | ICD-10-CM

## 2016-07-14 DIAGNOSIS — F418 Other specified anxiety disorders: Secondary | ICD-10-CM

## 2016-07-14 DIAGNOSIS — G713 Mitochondrial myopathy, not elsewhere classified: Secondary | ICD-10-CM

## 2016-07-14 DIAGNOSIS — G8929 Other chronic pain: Secondary | ICD-10-CM

## 2016-07-14 DIAGNOSIS — F419 Anxiety disorder, unspecified: Secondary | ICD-10-CM

## 2016-07-14 MED ORDER — HYDROCODONE-ACETAMINOPHEN 10-325 MG PO TABS: 1.0000 | ORAL_TABLET | Freq: Two times a day (BID) | ORAL | 0 refills | Status: DC

## 2016-07-14 NOTE — Progress Notes (Signed)
   Subjective:    Patient ID: Wendy Rose, female    DOB: [birth year], 70 y.o.   MRN: [id number]  HPI  70 year old Female for chronic pain management follow up. Generally take one half of hydrocodone APAP tab twice daily. Sometimes need one whole tab instead of half. Feels pain control is adequate. Hx fibromyalgia and mitochondrial myopathy.  Will be seeing Neurologist at [facility] in April. Was not able to keep November appt as husband was hospitalized.  Seen recently here for headache here treated with prednisone. Took 24 hours to improve.  No new complaints. Continues with PT for muscle weakness.  TSH checked in July  Seems less depressed today.  Husband had surgery around Thanksgiving. He is recovering slowly. He has lots of doctors appointments and that keeps her busy.       Review of Systems     Objective:   Physical Exam Neck is supple without thyromegaly. Chest clear to auscultation. Cardiac exam regular rate and rhythm normal S1 and S2. Extremities without edema.       Assessment & Plan:  Fibromyalgia  Mitochondrial myopathy  Anxiety depression  Chronic pain related to fibromyalgia  History of headaches-treated with prednisone with improvement  Plan: Refill hydrocodone/APAP as prescribed and return in 90 days for reevaluation.

## 2016-07-16 DIAGNOSIS — R270 Ataxia, unspecified: Secondary | ICD-10-CM | POA: Diagnosis not present

## 2016-07-16 DIAGNOSIS — R269 Unspecified abnormalities of gait and mobility: Secondary | ICD-10-CM | POA: Diagnosis not present

## 2016-07-16 DIAGNOSIS — G713 Mitochondrial myopathy, not elsewhere classified: Secondary | ICD-10-CM | POA: Diagnosis not present

## 2016-07-16 DIAGNOSIS — M6281 Muscle weakness (generalized): Secondary | ICD-10-CM | POA: Diagnosis not present

## 2016-07-26 ENCOUNTER — Other Ambulatory Visit: Payer: Self-pay | Admitting: Internal Medicine

## 2016-07-30 DIAGNOSIS — G713 Mitochondrial myopathy, not elsewhere classified: Secondary | ICD-10-CM | POA: Diagnosis not present

## 2016-07-30 DIAGNOSIS — R269 Unspecified abnormalities of gait and mobility: Secondary | ICD-10-CM | POA: Diagnosis not present

## 2016-07-30 DIAGNOSIS — R270 Ataxia, unspecified: Secondary | ICD-10-CM | POA: Diagnosis not present

## 2016-07-30 DIAGNOSIS — M6281 Muscle weakness (generalized): Secondary | ICD-10-CM | POA: Diagnosis not present

## 2016-08-07 DIAGNOSIS — G713 Mitochondrial myopathy, not elsewhere classified: Secondary | ICD-10-CM | POA: Diagnosis not present

## 2016-08-07 DIAGNOSIS — R270 Ataxia, unspecified: Secondary | ICD-10-CM | POA: Diagnosis not present

## 2016-08-07 DIAGNOSIS — M6281 Muscle weakness (generalized): Secondary | ICD-10-CM | POA: Diagnosis not present

## 2016-08-07 DIAGNOSIS — R269 Unspecified abnormalities of gait and mobility: Secondary | ICD-10-CM | POA: Diagnosis not present

## 2016-08-19 ENCOUNTER — Ambulatory Visit (INDEPENDENT_AMBULATORY_CARE_PROVIDER_SITE_OTHER): Payer: Medicare Other | Admitting: *Deleted

## 2016-08-19 DIAGNOSIS — I495 Sick sinus syndrome: Secondary | ICD-10-CM

## 2016-08-19 NOTE — Progress Notes (Signed)
Remote pacemaker transmission.   

## 2016-08-20 ENCOUNTER — Ambulatory Visit (INDEPENDENT_AMBULATORY_CARE_PROVIDER_SITE_OTHER): Payer: PPO | Admitting: Student

## 2016-08-20 ENCOUNTER — Encounter: Payer: Self-pay | Admitting: Cardiology

## 2016-08-20 ENCOUNTER — Encounter: Payer: Self-pay | Admitting: Student

## 2016-08-20 VITALS — BP 132/80 | HR 60 | Ht 70.0 in | Wt 163.0 lb

## 2016-08-20 DIAGNOSIS — R001 Bradycardia, unspecified: Secondary | ICD-10-CM | POA: Diagnosis not present

## 2016-08-20 DIAGNOSIS — E785 Hyperlipidemia, unspecified: Secondary | ICD-10-CM

## 2016-08-20 DIAGNOSIS — Z7901 Long term (current) use of anticoagulants: Secondary | ICD-10-CM | POA: Diagnosis not present

## 2016-08-20 DIAGNOSIS — I484 Atypical atrial flutter: Secondary | ICD-10-CM

## 2016-08-20 MED ORDER — ATORVASTATIN CALCIUM 20 MG PO TABS: 20.0000 mg | ORAL_TABLET | Freq: Every day | ORAL | 3 refills | Status: DC

## 2016-08-20 NOTE — Progress Notes (Signed)
Cardiology Office Note    Date:  08/20/2016   ID:  Wendy Rose, DOB 1946-05-02, MRN [id number]  PCP:  Elby Showers, MD  Cardiologist: Dr. Claiborne Billings Primary Electrophysiologist: Dr. Rayann Heman  Chief Complaint  Patient presents with  . Follow-up    Annual Appointment    History of Present Illness:    Wendy Rose is a 71 y.o. female with past medical history of mitochondrial myopathy (followed at [facility]), symptomatic bradycardia (s/p PPM placement in 02/2013), HLD and paroxysmal atrial flutter (on Eliquis) who presents to the office today for annual follow-up.   She was last seen by Dr. Claiborne Billings in 01/2015 and reported doing well at that time. She denied any episodes of chest discomfort or palpitations. Seen by Dr. Rayann Heman in 05/2016 with device interrogation showing normal PPM function. Was continued on Eliquis for anticoagulation. Last echocardiogram was in 02/2013 and showed normal LV function with an EF of 55-60%, mild MR, mild TR, and PA peak pressure of 40 mmHg. NST obtained at that time and showed a small fixed apical defect which was thought to be related to a pacing artifact, otherwise no reversible ischemia identified.   In talking with the patient today, she reports being under a large amount of stress secondary to her husband's medical conditions. By her report he was hospitalized 11 of the past 12 months. He is currently back at home but is very weak and falls frequently. She is very anxious about his health and is not sleeping well.  She denies any recent chest discomfort, palpitations, or dyspnea with exertion. Unaware of when she is in atrial flutter.  She reports good compliance with her medications including Eliquis. She denies any evidence of epistaxis, hematochezia, or melena. Does report having occasional falls secondary to her mitochondrial disease. She becomes acutely weak and lowers herself to the ground. Denies hitting her head or sustaining any substantial injuries.    Past  Medical History:  Diagnosis Date  . Anemia   . Anxiety   . Arthritis   . Chronic headaches   . Depression   . Fibromyalgia   . GERD (gastroesophageal reflux disease)   . History of echocardiogram    a. echo 02/2013: normal LV function with an EF of 55-60%, mild MR, mild TR, and PA peak pressure of 40 mmHg  . Hyperlipemia   . IBS (irritable bowel syndrome)   . MVP (mitral valve prolapse)   . Pneumonia   . PONV (postoperative nausea and vomiting)   . S/P cardiac pacemaker procedure, 02/16/13, Bio tronik device 02/19/2013  . Symptomatic bradycardia    s/p Biotronik Evia dual chamber pacemaker implant 02/16/2013 by Dr Rayann Heman  . UTI (lower urinary tract infection)   . Vertigo     Past Surgical History:  Procedure Laterality Date  . BLADDER SUSPENSION    . CESAREAN SECTION    . DILATION AND CURETTAGE OF UTERUS    . PACEMAKER INSERTION  02/16/2013   Biotronik Evia dual chamber pacemaker implanted by Dr Rayann Heman  . PERMANENT PACEMAKER INSERTION N/A 02/16/2013   Procedure: PERMANENT PACEMAKER INSERTION;  Surgeon: Thompson Grayer, MD;  Location: [facility] CATH LAB;  Service: Cardiovascular;  Laterality: N/A;  . TONSILLECTOMY AND ADENOIDECTOMY    . TOTAL ABDOMINAL HYSTERECTOMY    . TYMPANOPLASTY      Current Medications: Outpatient Medications Prior to Visit  Medication Sig Dispense Refill  . ALPRAZolam (XANAX) 1 MG tablet Take 1 tablet (1 mg total) by mouth 3 (three) times  daily as needed for anxiety. 60 tablet 5  . B Complex-C (B-COMPLEX WITH VITAMIN C) tablet Take 1 tablet by mouth daily.    Marland Kitchen buPROPion (WELLBUTRIN XL) 300 MG 24 hr tablet Take 1 tablet (300 mg total) by mouth daily. 90 tablet 3  . calcium-vitamin D (CALCIUM 500+D) 500-200 MG-UNIT per tablet Take 1 tablet by mouth 2 (two) times daily.     Marland Kitchen co-enzyme Q-10 50 MG capsule Take 50 mg by mouth 2 (two) times daily.    . Creatinine POWD Take by mouth as directed.     Marland Kitchen ELIQUIS 5 MG TABS tablet take 1 tablet by mouth twice a day 60  tablet 11  . FLUoxetine (PROZAC) 40 MG capsule Take 1 capsule (40 mg total) by mouth daily. 90 capsule 3  . HYDROcodone-acetaminophen (NORCO) 10-325 MG tablet Take 1 tablet by mouth every 12 (twelve) hours. 60 tablet 0  . hydrocortisone 2.5 % cream Apply 1 application topically 2 (two) times daily.    Marland Kitchen lidocaine (LIDODERM) 5 % apply 1 to 3 patches to affected area 12 HOURS OUT OF 24 HOURS 90 patch 5  . Magnesium Oxide (MAG-200 PO) Take 1 tablet by mouth 2 (two) times daily.     . methylPREDNISolone (MEDROL) 4 MG tablet Take 6 tabs day 1 and decrease by one tab daily 6-5-4-3-2-1 21 tablet 0  . ondansetron (ZOFRAN) 4 MG tablet Take 1 tablet (4 mg total) by mouth every 8 (eight) hours as needed for nausea or vomiting. 20 tablet 0  . pantoprazole (PROTONIX) 40 MG tablet take 1 tablet by mouth twice a day 60 tablet 1  . QUEtiapine (SEROQUEL) 50 MG tablet Take 1 tablet (50 mg total) by mouth at bedtime. 30 tablet 0  . triamcinolone cream (KENALOG) 0.1 % Apply 1 application topically 2 (two) times daily. 30 g 0  . amoxicillin (AMOXIL) 500 MG capsule Take 1 capsule (500 mg total) by mouth 3 (three) times daily. 30 capsule 0  . atorvastatin (LIPITOR) 20 MG tablet take 1 tablet by mouth once daily NEED APPOINTMENT 90 tablet 0  . buPROPion (WELLBUTRIN XL) 150 MG 24 hr tablet Pt unsure of strength. Will call us when she gets home (05/21/16)  0  . traMADol (ULTRAM-ER) 100 MG 24 hr tablet Take 100 mg by mouth 2 (two) times daily.      No facility-administered medications prior to visit.      Allergies:   Contrast media [iodinated diagnostic agents]; Ciprofloxacin; Sulfa antibiotics; Cefuroxime axetil; Codeine; Naproxen; Nitrofurantoin; Sulfonamide derivatives; and Sulindac   Social History   Social History  . Marital status: Married    Spouse name: N/A  . Number of children: N/A  . Years of education: N/A   Social History Main Topics  . Smoking status: Never Smoker  . Smokeless tobacco: Never Used    . Alcohol use No  . Drug use: No  . Sexual activity: Yes   Other Topics Concern  . None   Social History Narrative  . None     Family History:  The patient's family history includes Diabetes in her sister; Heart disease in her brother and father; Hypertension in her mother.   Review of Systems:   Please see the history of present illness.     General:  No chills, fever, night sweats or weight changes. Positive for generalized fatigue and difficulty sleeping. Cardiovascular:  No chest pain, dyspnea on exertion, edema, orthopnea, palpitations, paroxysmal nocturnal dyspnea. Dermatological: No rash, lesions/masses  Respiratory: No cough, dyspnea Urologic: No hematuria, dysuria Abdominal:   No nausea, vomiting, diarrhea, bright red blood per rectum, melena, or hematemesis Neurologic:  No visual changes, wkns, changes in mental status. All other systems reviewed and are otherwise negative except as noted above.   Physical Exam:    VS:  BP 132/80   Pulse 60   Ht 5\' 10"  (1.778 m)   Wt 163 lb (73.9 kg)   LMP  (Exact Date)   BMI 23.39 kg/m    General: Well developed, well nourished Caucasian female appearing in no acute distress. Head: Normocephalic, atraumatic, sclera non-icteric, no xanthomas, nares are without discharge.  Neck: No carotid bruits. JVD not elevated.  Lungs: Respirations regular and unlabored, without wheezes or rales.  Heart: Regular rate and rhythm. No S3 or S4.  No murmur, no rubs, or gallops appreciated. Abdomen: Soft, non-tender, non-distended with normoactive bowel sounds. No hepatomegaly. No rebound/guarding. No obvious abdominal masses. Msk:  Strength and tone appear normal for age. No joint deformities or effusions. Extremities: No clubbing or cyanosis. No edema.  Distal pedal pulses are 2+ bilaterally. Neuro: Alert and oriented X 3. Moves all extremities spontaneously. No focal deficits noted. Psych:  Responds to questions appropriately with a normal  affect. Skin: No rashes or lesions noted  Wt Readings from Last 3 Encounters:  08/20/16 163 lb (73.9 kg)  07/14/16 164 lb (74.4 kg)  06/22/16 165 lb (74.8 kg)     Studies/Labs Reviewed:   EKG:  EKG is ordered today. The ekg ordered today demonstrates A-pacing, HR 68, with nonspecific intraventricular block.   Recent Labs: 02/25/2016: ALT 20; BUN 15; Creat 0.69; Hemoglobin 13.5; Platelets 221; Potassium 4.4; Sodium 140; TSH 1.24   Lipid Panel    Component Value Date/Time   CHOL 149 02/25/2016 1056   CHOL 233 (H) 03/20/2013 1019   TRIG 80 02/25/2016 1056   TRIG 82 03/20/2013 1019   HDL 64 02/25/2016 1056   HDL 64 03/20/2013 1019   CHOLHDL 2.3 02/25/2016 1056   VLDL 16 02/25/2016 1056   LDLCALC 69 02/25/2016 1056   LDLCALC 153 (H) 03/20/2013 1019    Additional studies/ records that were reviewed today include:   Nuclear Stress Test: 03/2013 Impression Exercise Capacity:  Lexiscan with no exercise. BP Response:  Normal blood pressure response. Clinical Symptoms:  No significant symptoms noted. ECG Impression:  No significant ECG changes with Lexiscan. Comparison with Prior Nuclear Study: No previous nuclear study performed  Overall Impression:  Small fixed apical septal defect may a pacing related artifact. Otherwise normal perfusion pattern. No reversible ischemia. This is a low risk study.  LV Wall Motion:  There is paradoxical septal motion and apical dyssynchrony, pacing related. Normal overall LV systolic function.  Assessment:    1. Atypical atrial flutter ([place])   2. Chronic anticoagulation   3. Symptomatic bradycardia   4. Hyperlipidemia, unspecified hyperlipidemia type      Plan:   In order of problems listed above:  1. Atypical Atrial Flutter/ Chronic Anticoagulation - This patients CHA2DS2-VASc Score and unadjusted Ischemic Stroke Rate (% per year) is equal to 3.2 % stroke rate/year from a score of 3 (HTN, Female, Age). Denies any evidence of active  bleeding. Continue Eliquis 5mg  BID for anticogaulation.  - in NSR on examination today. Not on BB or CCB therapy with history of hypotension.   2. Symptomatic Bradycardia - s/p PPM placement in 02/2013 - followed by Dr. Rayann Heman  3. HLD - Lipid Panel in  02/2016 showed total cholesterol 149, HDL 64, and LDL 69. LFT's within normal limits.  - continue Atorvastatin 20mg  daily.   Medication Adjustments/Labs and Tests Ordered: Current medicines are reviewed at length with the patient today.  Concerns regarding medicines are outlined above.  Medication changes, Labs and Tests ordered today are listed in the Patient Instructions below. Patient Instructions  Medication Instructions:  Your physician recommends that you continue on your current medications as directed. Please refer to the Current Medication list given to you today.  Labwork: None   Testing/Procedures: None   Follow-Up: Your physician wants you to follow-up in: [place]. You will receive a reminder letter in the mail two months in advance. If you don't receive a letter, please call our office to schedule the follow-up appointment.  Any Other Special Instructions Will Be Listed Below (If Applicable).  If you need a refill on your cardiac medications before your next appointment, please call your pharmacy.   Wendy Rose, [place]  08/20/2016 5:21 PM    [facility] [place], [place], [place]  [address] Phone: [phone]; Fax: [phone]  [address] [place], [place] [address] Phone: [phone]

## 2016-08-20 NOTE — Patient Instructions (Signed)
Medication Instructions:  Your physician recommends that you continue on your current medications as directed. Please refer to the Current Medication list given to you today.  Labwork: None   Testing/Procedures: None   Follow-Up: Your physician wants you to follow-up in: [place]. You will receive a reminder letter in the mail two months in advance. If you don't receive a letter, please call our office to schedule the follow-up appointment.  Any Other Special Instructions Will Be Listed Below (If Applicable).  If you need a refill on your cardiac medications before your next appointment, please call your pharmacy.

## 2016-08-25 ENCOUNTER — Ambulatory Visit: Payer: Medicare Other | Admitting: Student

## 2016-08-31 LAB — CUP PACEART REMOTE DEVICE CHECK
Brady Statistic RV Percent Paced: 99 %
Date Time Interrogation Session: 20180122171244
Implantable Lead Implant Date: 20140710
Implantable Lead Location: 753859
Implantable Lead Location: 753860
Implantable Lead Model: 346
Implantable Lead Model: 346
Implantable Lead Serial Number: 29426855
Lead Channel Impedance Value: 761 Ohm
Lead Channel Impedance Value: 819 Ohm
Lead Channel Pacing Threshold Pulse Width: 0.4 ms
Lead Channel Sensing Intrinsic Amplitude: 3.2 mV
Lead Channel Setting Pacing Amplitude: 0.9 V
Lead Channel Setting Pacing Amplitude: 1.7 V
MDC IDC LEAD IMPLANT DT: 20140710
MDC IDC LEAD SERIAL: 29334372
MDC IDC MSMT LEADCHNL RA PACING THRESHOLD AMPLITUDE: 0.5 V
MDC IDC MSMT LEADCHNL RA PACING THRESHOLD PULSEWIDTH: 0.4 ms
MDC IDC MSMT LEADCHNL RV PACING THRESHOLD AMPLITUDE: 0.4 V
MDC IDC MSMT LEADCHNL RV SENSING INTR AMPL: 26.8 mV
MDC IDC PG IMPLANT DT: 20140710
MDC IDC PG SERIAL: 66385898
MDC IDC SET LEADCHNL RV PACING PULSEWIDTH: 0.4 ms
MDC IDC STAT BRADY RA PERCENT PACED: 77 %

## 2016-09-01 ENCOUNTER — Other Ambulatory Visit: Payer: Self-pay | Admitting: Internal Medicine

## 2016-09-03 ENCOUNTER — Encounter: Payer: Self-pay | Admitting: Cardiology

## 2016-09-03 ENCOUNTER — Telehealth: Payer: Self-pay | Admitting: Internal Medicine

## 2016-09-03 ENCOUNTER — Other Ambulatory Visit: Payer: Self-pay

## 2016-09-03 MED ORDER — HYDROCODONE-ACETAMINOPHEN 10-325 MG PO TABS: 1.0000 | ORAL_TABLET | Freq: Two times a day (BID) | ORAL | 0 refills | Status: DC

## 2016-09-03 NOTE — Telephone Encounter (Signed)
Spoke with patient and advised that her Rx is ready to be picked up.

## 2016-09-03 NOTE — Telephone Encounter (Signed)
Patient calling to request refill on her Hydrocodone 10-325 mg.  Please call patient when ready to pick up.    Best number for contact [phone].

## 2016-09-03 NOTE — Telephone Encounter (Signed)
Done

## 2016-09-03 NOTE — Telephone Encounter (Signed)
Please print Norco 10/325 #60 one  Po q 12 hours with NO refill

## 2016-09-04 ENCOUNTER — Other Ambulatory Visit: Payer: Self-pay | Admitting: Internal Medicine

## 2016-09-05 NOTE — Telephone Encounter (Signed)
Refill x 6 months 

## 2016-09-07 NOTE — Patient Instructions (Signed)
Continue same medications as previously prescribed and return in 90 days for follow-up on chronic pain management

## 2016-09-22 ENCOUNTER — Emergency Department (HOSPITAL_COMMUNITY)
Admission: EM | Admit: 2016-09-22 | Discharge: 2016-09-22 | Disposition: A | Discharge status: Home / Self Care | Payer: PPO | Attending: Emergency Medicine | Admitting: Emergency Medicine

## 2016-09-22 ENCOUNTER — Encounter (HOSPITAL_COMMUNITY): Payer: Self-pay | Admitting: Emergency Medicine

## 2016-09-22 ENCOUNTER — Emergency Department (HOSPITAL_COMMUNITY): Payer: PPO

## 2016-09-22 DIAGNOSIS — Y92010 Kitchen of single-family (private) house as the place of occurrence of the external cause: Secondary | ICD-10-CM | POA: Diagnosis not present

## 2016-09-22 DIAGNOSIS — Z7901 Long term (current) use of anticoagulants: Secondary | ICD-10-CM | POA: Diagnosis not present

## 2016-09-22 DIAGNOSIS — S0990XA Unspecified injury of head, initial encounter: Secondary | ICD-10-CM | POA: Insufficient documentation

## 2016-09-22 DIAGNOSIS — Y999 Unspecified external cause status: Secondary | ICD-10-CM | POA: Insufficient documentation

## 2016-09-22 DIAGNOSIS — Y9301 Activity, walking, marching and hiking: Secondary | ICD-10-CM | POA: Diagnosis not present

## 2016-09-22 DIAGNOSIS — R51 Headache: Secondary | ICD-10-CM | POA: Diagnosis not present

## 2016-09-22 DIAGNOSIS — W228XXA Striking against or struck by other objects, initial encounter: Secondary | ICD-10-CM | POA: Insufficient documentation

## 2016-09-22 LAB — CBC
HCT: 39.2 % (ref 36.0–46.0)
HEMOGLOBIN: 13.2 g/dL (ref 12.0–15.0)
MCH: 28.6 pg (ref 26.0–34.0)
MCHC: 33.7 g/dL (ref 30.0–36.0)
MCV: 85 fL (ref 78.0–100.0)
Platelets: 223 10*3/uL (ref 150–400)
RBC: 4.61 MIL/uL (ref 3.87–5.11)
RDW: 12.8 % (ref 11.5–15.5)
WBC: 7.1 10*3/uL (ref 4.0–10.5)

## 2016-09-22 LAB — COMPREHENSIVE METABOLIC PANEL
ALBUMIN: 4 g/dL (ref 3.5–5.0)
ALK PHOS: 65 U/L (ref 38–126)
ALT: 20 U/L (ref 14–54)
AST: 26 U/L (ref 15–41)
Anion gap: 11 (ref 5–15)
BILIRUBIN TOTAL: 0.6 mg/dL (ref 0.3–1.2)
BUN: 15 mg/dL (ref 6–20)
CO2: 23 mmol/L (ref 22–32)
Calcium: 9.4 mg/dL (ref 8.9–10.3)
Chloride: 104 mmol/L (ref 101–111)
Creatinine, Ser: 0.54 mg/dL (ref 0.44–1.00)
GFR calc Af Amer: >60 mL/min (ref >60)
GFR calc non Af Amer: >60 mL/min (ref >60)
GLUCOSE: 149 mg/dL — AB (ref 65–99)
POTASSIUM: 3.6 mmol/L (ref 3.5–5.1)
SODIUM: 138 mmol/L (ref 135–145)
TOTAL PROTEIN: 6.1 g/dL — AB (ref 6.5–8.1)

## 2016-09-22 LAB — DIFFERENTIAL
BASOS ABS: 0 10*3/uL (ref 0.0–0.1)
Basophils Relative: 0 %
Eosinophils Absolute: 0.1 10*3/uL (ref 0.0–0.7)
Eosinophils Relative: 2 %
LYMPHS ABS: 2.2 10*3/uL (ref 0.7–4.0)
LYMPHS PCT: 31 %
Monocytes Absolute: 0.6 10*3/uL (ref 0.1–1.0)
Monocytes Relative: 8 %
NEUTROS PCT: 59 %
Neutro Abs: 4.2 10*3/uL (ref 1.7–7.7)

## 2016-09-22 LAB — I-STAT CHEM 8, ED
BUN: 16 mg/dL (ref 6–20)
CALCIUM ION: 1.13 mmol/L — AB (ref 1.15–1.40)
CHLORIDE: 104 mmol/L (ref 101–111)
Creatinine, Ser: 0.4 mg/dL — ABNORMAL LOW (ref 0.44–1.00)
Glucose, Bld: 145 mg/dL — ABNORMAL HIGH (ref 65–99)
HCT: 40 % (ref 36.0–46.0)
Hemoglobin: 13.6 g/dL (ref 12.0–15.0)
POTASSIUM: 3.5 mmol/L (ref 3.5–5.1)
SODIUM: 142 mmol/L (ref 135–145)
TCO2: 24 mmol/L (ref 0–100)

## 2016-09-22 LAB — PROTIME-INR
INR: 0.99
Prothrombin Time: 13.1 seconds (ref 11.4–15.2)

## 2016-09-22 LAB — CBG MONITORING, ED: Glucose-Capillary: 104 mg/dL — ABNORMAL HIGH (ref 65–99)

## 2016-09-22 LAB — I-STAT TROPONIN, ED: Troponin i, poc: 0.01 ng/mL (ref 0.00–0.08)

## 2016-09-22 LAB — APTT: APTT: 28 s (ref 24–36)

## 2016-09-22 NOTE — ED Triage Notes (Signed)
Pt reports that she was walking into the kitchen when she fell backwards hitting her head on door. Pt is taking Eloquis.

## 2016-09-22 NOTE — ED Provider Notes (Signed)
TIME SEEN: 5:45 AM  CHIEF COMPLAINT: Head injury  HPI: Pt is a 71 y.o. 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 Reports that she lost her balance and fell backward striking her head on a table. She is on Eliquis and recently lost her husband to intracranial hemorrhage sit is concerned her and she came to the hospital. Denies new numbness, tingling or focal weakness. Does have chronic unchanged neuropathy in bilateral feet. No neck or back pain. No preceding symptoms that led to her fall including chest pain or shortness of breath. No recent infectious symptoms. Has mild headache currently but declines any medication for her headache.  ROS: See HPI Constitutional: no fever  Eyes: no drainage  ENT: no runny nose   Cardiovascular:  no chest pain  Resp: no SOB  GI: no vomiting GU: no dysuria Integumentary: no rash  Allergy: no hives  Musculoskeletal: no leg swelling  Neurological: no slurred speech ROS otherwise negative  PAST MEDICAL HISTORY/PAST SURGICAL HISTORY:  Past Medical History:  Diagnosis Date  . Anemia   . Anxiety   . Arthritis   . Chronic headaches   . Depression   . Fibromyalgia   . GERD (gastroesophageal reflux disease)   . History of echocardiogram    a. echo 02/2013: normal LV function with an EF of 55-60%, mild MR, mild TR, and PA peak pressure of 40 mmHg  . Hyperlipemia   . IBS (irritable bowel syndrome)   . MVP (mitral valve prolapse)   . Pneumonia   . PONV (postoperative nausea and vomiting)   . S/P cardiac pacemaker procedure, 02/16/13, Bio tronik device 02/19/2013  . Symptomatic bradycardia    s/p Biotronik Evia dual chamber pacemaker implant 02/16/2013 by Dr Rayann Heman  . UTI (lower urinary tract infection)   . Vertigo     MEDICATIONS:  Prior to Admission  medications   Medication Sig Start Date End Date Taking? Authorizing Provider  ALPRAZolam Duanne Moron) 1 MG tablet TAKE 1/2 TABLET BY MOUTH EVERY MORNING AND EVENING AND 1 TABLET AT BEDTIME 09/07/16   Elby Showers, MD  atorvastatin (LIPITOR) 20 MG tablet Take 1 tablet (20 mg total) by mouth daily at 6 PM. 08/20/16   Erma Heritage, PA  B Complex-C (B-COMPLEX WITH VITAMIN C) tablet Take 1 tablet by mouth daily.    Historical Provider, MD  buPROPion (WELLBUTRIN XL) 300 MG 24 hr tablet Take 1 tablet (300 mg total) by mouth daily. 03/29/16   Elby Showers, MD  calcium-vitamin D (CALCIUM 500+D) 500-200 MG-UNIT per tablet Take 1 tablet by mouth 2 (two) times daily.     Historical Provider, MD  co-enzyme Q-10 50 MG capsule Take 50 mg by mouth 2 (two) times daily.    Historical Provider, MD  Creatinine POWD Take by mouth as directed.  08/11/12   Historical Provider, MD  ELIQUIS 5 MG TABS tablet take 1 tablet by mouth twice a day 09/01/16   Troy Sine, MD  FLUoxetine (PROZAC) 40 MG capsule Take 1 capsule (40 mg total) by mouth daily. 04/30/16   Elby Showers, MD  HYDROcodone-acetaminophen (NORCO) 10-325 MG tablet Take 1 tablet by mouth every 12 (twelve) hours. 09/03/16   Elby Showers, MD  hydrocortisone 2.5 % cream Apply 1 application topically 2 (two) times daily.    Historical Provider, MD  lidocaine (LIDODERM) 5 % apply 1 to 3 patches to affected area 12 HOURS OUT OF 24 HOURS 02/12/15   Elby Showers, MD  Magnesium Oxide (MAG-200 PO) Take 1 tablet by mouth 2 (two) times daily.     Historical Provider, MD  methylPREDNISolone (MEDROL) 4 MG tablet Take 6 tabs day 1 and decrease by one tab daily 6-5-4-3-2-1 06/22/16   Elby Showers, MD  ondansetron (ZOFRAN) 4 MG tablet Take 1 tablet (4 mg total) by mouth every 8 (eight) hours as needed for nausea or vomiting. 11/20/14   Elby Showers, MD  pantoprazole (PROTONIX) 40 MG tablet take 1 tablet by mouth twice a day 07/27/16   Irene Shipper, MD  QUEtiapine (SEROQUEL) 50  MG tablet Take 1 tablet (50 mg total) by mouth at bedtime. 04/07/16   Elby Showers, MD  triamcinolone cream (KENALOG) 0.1 % Apply 1 application topically 2 (two) times daily. 06/26/15   Melony Overly, MD    ALLERGIES:  Allergies  Allergen Reactions  . Contrast Media [Iodinated Diagnostic Agents] Shortness Of Breath    "difficulty breathing"  . Ciprofloxacin Itching    Pt stated itching   . Sulfa Antibiotics     Other reaction(s): Unknown  . Cefuroxime Axetil Rash    REACTION: Rash  . Codeine Other (See Comments)    REACTION: Reaction not known  . Naproxen Other (See Comments)    REACTION: Reaction not known  . Nitrofurantoin Rash    REACTION: Increase LFT's  . Sulfonamide Derivatives Other (See Comments)    REACTION: Reaction not known  . Sulindac Other (See Comments)    REACTION: Reaction not known    SOCIAL HISTORY:  Social History  Substance Use Topics  . Smoking status: Never Smoker  . Smokeless tobacco: Never Used  . Alcohol use No    FAMILY HISTORY: Family History  Problem Relation Age of Onset  . Hypertension Mother   . Heart disease Father   . Diabetes Sister   . Heart disease Brother   . Colon cancer Neg Hx   . Esophageal cancer Neg Hx   . Stomach cancer Neg Hx   . Rectal cancer Neg Hx     EXAM: BP 164/87   Pulse 73   Temp 98 F (36.7 C) (Oral)   Resp 14   Ht 5\' 10"  (1.778 m)   Wt 163 lb (73.9 kg)   SpO2 100%   BMI 23.39 kg/m  CONSTITUTIONAL: Alert and oriented and responds appropriately to questions. Well-appearing; well-nourished; GCS 15 HEAD: Normocephalic; atraumatic EYES: Conjunctivae clear, PERRL, EOMI ENT: normal nose; no rhinorrhea; moist mucous membranes; pharynx without lesions noted; no dental injury; no septal hematoma NECK: Supple, no meningismus, no LAD; no midline spinal tenderness, step-off or deformity; trachea midline CARD: RRR; S1 and S2 appreciated; no murmurs, no clicks, no rubs, no gallops RESP: Normal chest excursion  without splinting or tachypnea; breath sounds clear and equal bilaterally; no wheezes, no rhonchi, no rales; no hypoxia or respiratory distress CHEST:  chest wall stable, no crepitus or ecchymosis or deformity, nontender to palpation; no flail chest ABD/GI: Normal bowel sounds; non-distended; soft, non-tender, no rebound, no guarding; no ecchymosis or other lesions noted PELVIS:  stable, nontender to palpation BACK:  The back appears normal and is non-tender to palpation, there is no CVA tenderness; no midline spinal tenderness, step-off or deformity EXT: Normal ROM in all joints; non-tender to palpation; no edema; normal capillary refill; no cyanosis, no bony  tenderness or bony deformity of patient's extremities, no joint effusion, compartments are soft, extremities are warm and well-perfused, no ecchymosis or lacerations    SKIN: Normal color for age and race; warm NEURO: Moves all extremities equally, sensation to light touch intact diffusely, cranial nerves II through XII intact, No pronator drift, normal speech PSYCH: The patient's mood and manner are appropriate. Grooming and personal hygiene are appropriate.  MEDICAL DECISION MAKING: Patient here with mechanical fall. Head CT unremarkable. Labs obtained in triage are also normal. She has no acute complaints currently. Still neurologically intact. Have discussed return precautions with head injury while on anticoagulation. Have offered her pain medication for her mild headache which she declines. States she has pain medication at home that she will take. No other sign of trauma on exam. I feel she is safe to be discharged home. She is comfortable with this plan.  At this time, I do not feel there is any life-threatening condition present. I have reviewed and discussed all results (EKG, imaging, lab, urine as appropriate) and exam findings with patient/family. I have reviewed nursing notes and appropriate previous records.  I feel the patient is  safe to be discharged home without further emergent workup and can continue workup as an outpatient as needed. Discussed usual and customary return precautions. Patient/family verbalize understanding and are comfortable with this plan.  Outpatient follow-up has been provided. All questions have been answered.        [place], DO 09/22/16 [phone]

## 2016-09-23 ENCOUNTER — Other Ambulatory Visit: Payer: Self-pay

## 2016-09-23 NOTE — Patient Outreach (Signed)
[facility]) Care Management  09/23/2016  Wendy Rose [birth year] [id number]   Telephone Screen  Referral Date: 09/22/16 Referral Source: Nurse Call Center Report Referral Reason: follow up on "caller states that she has fallen and hit her head, hit it fairly hard, she takes Eliquis. She is nervous because her husband died last month of the same thing."  Caller instructed by Nurse Line staff to go to ED.    Outreach attempt #  1 to patient. Spoke with patient.She states that she is doing and feeling better today. She reports that her "head is better today." Denies any headaches at present. She voices that she took advice from nurse at call center and went to ED to be checked out. Patient states that lab work and scans were done and everything was negative. She voices that she is relieved that nothing happened as a result of her fall. Patient is independent and able to manage care needs. Denies any issues with meds or transportation. She voices no further RN CM needs or concerns at this time. Patient instructed to contact 24/7 Nurse Line in the future as needed. She voiced understanding and was appreciative of call.     Plan: RN CM will notify [facility] administrative assistant of case status.  Enzo Montgomery, RN,BSN,CCM [facility] Telephonic Care Management Coordinator Direct Phone: [phone] Toll Free: [phone] Fax: [phone]

## 2016-10-13 ENCOUNTER — Ambulatory Visit (INDEPENDENT_AMBULATORY_CARE_PROVIDER_SITE_OTHER): Payer: PPO | Admitting: Internal Medicine

## 2016-10-13 ENCOUNTER — Encounter: Payer: Self-pay | Admitting: Internal Medicine

## 2016-10-13 VITALS — BP 130/80 | HR 63 | Ht 70.0 in | Wt 158.0 lb

## 2016-10-13 DIAGNOSIS — F329 Major depressive disorder, single episode, unspecified: Secondary | ICD-10-CM

## 2016-10-13 DIAGNOSIS — F4320 Adjustment disorder, unspecified: Secondary | ICD-10-CM | POA: Diagnosis not present

## 2016-10-13 DIAGNOSIS — F32A Depression, unspecified: Secondary | ICD-10-CM

## 2016-10-13 DIAGNOSIS — F4321 Adjustment disorder with depressed mood: Secondary | ICD-10-CM

## 2016-10-13 DIAGNOSIS — G8929 Other chronic pain: Secondary | ICD-10-CM

## 2016-10-13 DIAGNOSIS — G713 Mitochondrial myopathy, not elsewhere classified: Secondary | ICD-10-CM | POA: Diagnosis not present

## 2016-10-13 DIAGNOSIS — R531 Weakness: Secondary | ICD-10-CM | POA: Diagnosis not present

## 2016-10-13 DIAGNOSIS — F418 Other specified anxiety disorders: Secondary | ICD-10-CM | POA: Diagnosis not present

## 2016-10-13 DIAGNOSIS — F439 Reaction to severe stress, unspecified: Secondary | ICD-10-CM

## 2016-10-13 DIAGNOSIS — M797 Fibromyalgia: Secondary | ICD-10-CM | POA: Diagnosis not present

## 2016-10-13 DIAGNOSIS — F419 Anxiety disorder, unspecified: Secondary | ICD-10-CM

## 2016-10-13 MED ORDER — HYDROCODONE-ACETAMINOPHEN 10-325 MG PO TABS: 1.0000 | ORAL_TABLET | Freq: Two times a day (BID) | ORAL | 0 refills | Status: DC

## 2016-10-28 NOTE — Patient Instructions (Signed)
Have refilled Norco for pain control. Patient takes it appropriately. She is to return in 3 months or sooner if necessary.

## 2016-10-28 NOTE — Progress Notes (Signed)
   Subjective:    Patient ID: Wendy Rose, female    DOB: 1946/05/22, 71 y.o.   MRN: [id number]  HPI 71 year old Female with mitochondrial myopathy and chronic pain in today for follow-up on chronic pain management. She takes her medications sparingly. She does have a lot of musculoskeletal pain and history of fibromyalgia syndrome.  Since last visit, her husband passed away. He became suddenly ill at home and was transferred to the hospital. Apparently he had a cerebral vascular accident. She is appropriately grieving. She has a lot of paperwork to do in many people have approached her about buying some of his personal property which is been somewhat distressing to her.  Says she probably is not taking her medications as often as she should. We had discussion about this today. She says pain control is adequate when she takes her pain medications. Her if she has no history of abusing narcotic pain medication and this been on narcotic pain medication for several years.  She is followed at [facility] by a neurologist for mitochondrial myopathy.  She is frequently fatigued.    Review of Systems     Objective:   Physical Exam Chest clear. Cardiac exam regular rate and rhythm normal S1 and S2. Extremities without edema. Appropriate grief reaction. She talked for about 30 minutes today.       Assessment & Plan:  Mitochondrial myopathy with proximal muscle weakness  Fibromyalgia-requiring chronic pain medication. No evidence that she abuses narcotic pain medication. Database checked.  History of depression  Grief reaction due to loss of husband  Hyperlipidemia  History of mitral valve prolapse  Plan: Needs to be seen every 90 days due to narcotic pain prescription. Narcotic pain medication refilled for 30 days.  30 minutes spent with patient.

## 2016-11-12 DIAGNOSIS — H2512 Age-related nuclear cataract, left eye: Secondary | ICD-10-CM | POA: Diagnosis not present

## 2016-11-12 DIAGNOSIS — H2513 Age-related nuclear cataract, bilateral: Secondary | ICD-10-CM | POA: Diagnosis not present

## 2016-11-12 DIAGNOSIS — H35372 Puckering of macula, left eye: Secondary | ICD-10-CM | POA: Diagnosis not present

## 2016-11-12 DIAGNOSIS — H35033 Hypertensive retinopathy, bilateral: Secondary | ICD-10-CM | POA: Diagnosis not present

## 2016-11-12 DIAGNOSIS — H35373 Puckering of macula, bilateral: Secondary | ICD-10-CM | POA: Diagnosis not present

## 2016-11-12 DIAGNOSIS — H25013 Cortical age-related cataract, bilateral: Secondary | ICD-10-CM | POA: Diagnosis not present

## 2016-11-18 ENCOUNTER — Ambulatory Visit (INDEPENDENT_AMBULATORY_CARE_PROVIDER_SITE_OTHER): Payer: PPO | Admitting: *Deleted

## 2016-11-18 DIAGNOSIS — G713 Mitochondrial myopathy, not elsewhere classified: Secondary | ICD-10-CM | POA: Diagnosis not present

## 2016-11-18 DIAGNOSIS — G609 Hereditary and idiopathic neuropathy, unspecified: Secondary | ICD-10-CM | POA: Diagnosis not present

## 2016-11-18 DIAGNOSIS — I495 Sick sinus syndrome: Secondary | ICD-10-CM

## 2016-11-18 NOTE — Progress Notes (Signed)
Remote pacemaker transmission.   

## 2016-11-19 ENCOUNTER — Encounter: Payer: Self-pay | Admitting: Cardiology

## 2016-11-20 LAB — CUP PACEART REMOTE DEVICE CHECK
Brady Statistic AS VP Percent: 8 %
Brady Statistic RA Percent Paced: 91 %
Implantable Lead Implant Date: 20140710
Implantable Lead Location: 753859
Implantable Lead Model: 346
Implantable Lead Serial Number: 29334372
Implantable Lead Serial Number: 29426855
Implantable Pulse Generator Implant Date: 20140710
Lead Channel Impedance Value: 761 Ohm
Lead Channel Pacing Threshold Amplitude: 0.4 V
Lead Channel Pacing Threshold Pulse Width: 0.4 ms
Lead Channel Pacing Threshold Pulse Width: 0.4 ms
MDC IDC LEAD IMPLANT DT: 20140710
MDC IDC LEAD LOCATION: 753860
MDC IDC MSMT LEADCHNL RA IMPEDANCE VALUE: 683 Ohm
MDC IDC MSMT LEADCHNL RA PACING THRESHOLD AMPLITUDE: 0.6 V
MDC IDC SESS DTM: 20180413051210
MDC IDC SET LEADCHNL RV PACING PULSEWIDTH: 0.4 ms
MDC IDC STAT BRADY AP VP PERCENT: 92 %
MDC IDC STAT BRADY AP VS PERCENT: 0 %
MDC IDC STAT BRADY AS VS PERCENT: 0 %
MDC IDC STAT BRADY RV PERCENT PACED: 100 %
Pulse Gen Serial Number: 66385898

## 2016-11-24 DIAGNOSIS — H2512 Age-related nuclear cataract, left eye: Secondary | ICD-10-CM | POA: Diagnosis not present

## 2016-11-24 DIAGNOSIS — H25812 Combined forms of age-related cataract, left eye: Secondary | ICD-10-CM | POA: Diagnosis not present

## 2016-11-29 ENCOUNTER — Other Ambulatory Visit: Payer: Self-pay | Admitting: Internal Medicine

## 2016-11-30 ENCOUNTER — Telehealth: Payer: Self-pay | Admitting: Internal Medicine

## 2016-11-30 MED ORDER — HYDROCODONE-ACETAMINOPHEN 10-325 MG PO TABS: 1.0000 | ORAL_TABLET | Freq: Two times a day (BID) | ORAL | 0 refills | Status: DC

## 2016-11-30 NOTE — Telephone Encounter (Signed)
Patient calling for refill on her Norco prescription.  Please call patient when ready to pick up.  It's ok to leave a message on her HOME machine.  She has 3 month OV scheduled already for medical management visit in June.    Thank you.

## 2016-11-30 NOTE — Telephone Encounter (Signed)
Called patient to advised Rx is ready for pick up.

## 2016-11-30 NOTE — Telephone Encounter (Signed)
Refill once 

## 2016-12-03 ENCOUNTER — Encounter: Payer: Self-pay | Admitting: Cardiology

## 2016-12-22 ENCOUNTER — Other Ambulatory Visit: Payer: Self-pay | Admitting: Internal Medicine

## 2016-12-28 ENCOUNTER — Telehealth: Payer: Self-pay | Admitting: Internal Medicine

## 2016-12-28 NOTE — Telephone Encounter (Signed)
Needs pain management appt this week if cannot keep June 5th appt.

## 2016-12-28 NOTE — Telephone Encounter (Signed)
R/S and she will be in on Thursday for a pain management appointment with you; 5/24 @ 4:00 pm.  Spoke with patient and confirmed appointment.

## 2016-12-28 NOTE — Telephone Encounter (Signed)
Patient calling to request her refill for her pain medication - Norco.  She is not planning to pick this up until Friday afternoon.  She will be leaving to go out of town and will not be back for 3 weeks.  So, she wanted to make sure she had this to take with her.  She is scheduled for her 3 month pain medication management follow up in June.    Thank you.

## 2016-12-31 ENCOUNTER — Encounter: Payer: Self-pay | Admitting: Internal Medicine

## 2016-12-31 ENCOUNTER — Ambulatory Visit (INDEPENDENT_AMBULATORY_CARE_PROVIDER_SITE_OTHER): Payer: PPO | Admitting: Internal Medicine

## 2016-12-31 VITALS — BP 128/70 | HR 73 | Temp 97.9°F | Ht 70.0 in | Wt 153.0 lb

## 2016-12-31 DIAGNOSIS — F439 Reaction to severe stress, unspecified: Secondary | ICD-10-CM | POA: Diagnosis not present

## 2016-12-31 DIAGNOSIS — F329 Major depressive disorder, single episode, unspecified: Secondary | ICD-10-CM

## 2016-12-31 DIAGNOSIS — R531 Weakness: Secondary | ICD-10-CM | POA: Diagnosis not present

## 2016-12-31 DIAGNOSIS — F419 Anxiety disorder, unspecified: Secondary | ICD-10-CM | POA: Diagnosis not present

## 2016-12-31 DIAGNOSIS — G713 Mitochondrial myopathy, not elsewhere classified: Secondary | ICD-10-CM | POA: Diagnosis not present

## 2016-12-31 DIAGNOSIS — F4321 Adjustment disorder with depressed mood: Secondary | ICD-10-CM

## 2016-12-31 DIAGNOSIS — M797 Fibromyalgia: Secondary | ICD-10-CM

## 2016-12-31 DIAGNOSIS — F4320 Adjustment disorder, unspecified: Secondary | ICD-10-CM | POA: Diagnosis not present

## 2016-12-31 MED ORDER — HYDROCODONE-ACETAMINOPHEN 10-325 MG PO TABS: 1.0000 | ORAL_TABLET | Freq: Two times a day (BID) | ORAL | 0 refills | Status: DC

## 2016-12-31 NOTE — Progress Notes (Signed)
   Subjective:    Patient ID: Wendy Rose, female    DOB: Oct 29, 1945, 71 y.o.   MRN: [id number]  HPI   71 year old Female for follow up for chronic pain management. Having persistent grief reaction. Going to beach soon for 3 weeks.Pain is relieved with Norco 10/325 which she takes 1/2-1 tablet twice daily.  History of mitochondrial myopathy treated at [facility] by Dr. Doy Mince.  Long-standing history of fibromyalgia syndrome and back pain.  Neighbors have worried her about purchasing or borrowing her husband's possessions. She talks about this a great deal today.  Worried about kyphosis. Does not have it.  Says walker is too heavy and has trouble getting it into car. May call [facility] to see if they have lighter version.   Had left cataract extraction by Dr. Zigmund Daniel in April.  Needs to call PT and see if can have balance and gait training. Stopped PT around the time her husband was so ill and subsequently died.  Wants letter to [employer] to get box placed on house and not at end of driveway. Having issues walking to mailbox. She needs to get an address to whom I can write the letter.  Review of Systems- see above. Not sleeping well.     Objective:   Physical Exam  Chest clear to auscultation. Cor RRR: Ext without edema. Nonstop talking today. Needs to consider counseling.      Assessment & Plan:  Mitochondrial myopathy  Fibromyalgia  Anxiety depression  Grief reaction  Plan: Refill Norco 10/325  #60 one by mouth twice daily as needed for pain

## 2017-01-03 NOTE — Patient Instructions (Signed)
Norco refilled. Please provide address for [employer] regarding mailbox at her home. Please call physical therapy and see if physical therapy can be restarted for gait and balance training. Consider counseling for anxiety depression and grief reaction. Return in 3 months for chronic pain management appointment.

## 2017-01-12 ENCOUNTER — Ambulatory Visit: Payer: Self-pay | Admitting: Internal Medicine

## 2017-01-20 ENCOUNTER — Ambulatory Visit (INDEPENDENT_AMBULATORY_CARE_PROVIDER_SITE_OTHER): Payer: Medicare Other | Admitting: Ophthalmology

## 2017-01-26 ENCOUNTER — Ambulatory Visit: Payer: Self-pay | Admitting: Internal Medicine

## 2017-02-04 ENCOUNTER — Ambulatory Visit (INDEPENDENT_AMBULATORY_CARE_PROVIDER_SITE_OTHER): Payer: PPO | Admitting: Ophthalmology

## 2017-02-04 DIAGNOSIS — H43813 Vitreous degeneration, bilateral: Secondary | ICD-10-CM | POA: Diagnosis not present

## 2017-02-04 DIAGNOSIS — H35373 Puckering of macula, bilateral: Secondary | ICD-10-CM

## 2017-02-08 ENCOUNTER — Telehealth: Payer: Self-pay | Admitting: Internal Medicine

## 2017-02-08 NOTE — Telephone Encounter (Signed)
Letter written as requested. Will need to be typed.

## 2017-02-08 NOTE — Telephone Encounter (Signed)
Patient is calling with information regarding name/address of person to write regarding getting her mail delivered to her door.    States to write to:  Mr. Gladys Damme, [address] [place], [place]  [id number]  Thank you.

## 2017-02-09 DIAGNOSIS — Z029 Encounter for administrative examinations, unspecified: Secondary | ICD-10-CM

## 2017-02-11 ENCOUNTER — Telehealth: Payer: Self-pay

## 2017-02-11 MED ORDER — HYDROCODONE-ACETAMINOPHEN 10-325 MG PO TABS: 1.0000 | ORAL_TABLET | Freq: Two times a day (BID) | ORAL | 0 refills | Status: DC

## 2017-02-11 NOTE — Telephone Encounter (Signed)
Letter typed and mailed.

## 2017-02-11 NOTE — Telephone Encounter (Signed)
Refilled once per Dr. Renold Genta

## 2017-02-11 NOTE — Telephone Encounter (Signed)
Pt called requesting a refill on her hydrocodone, she is aware that Dr. Renold Genta is out of the office and is fine to wait until next week.

## 2017-02-17 ENCOUNTER — Ambulatory Visit (INDEPENDENT_AMBULATORY_CARE_PROVIDER_SITE_OTHER): Payer: PPO | Admitting: *Deleted

## 2017-02-17 DIAGNOSIS — I495 Sick sinus syndrome: Secondary | ICD-10-CM

## 2017-02-17 NOTE — Progress Notes (Signed)
Remote pacemaker transmission.   

## 2017-02-23 ENCOUNTER — Encounter: Payer: Self-pay | Admitting: Cardiology

## 2017-03-01 ENCOUNTER — Other Ambulatory Visit: Payer: PPO | Admitting: Internal Medicine

## 2017-03-01 DIAGNOSIS — Z130 Encounter for screening for diseases of the blood and blood-forming organs and certain disorders involving the immune mechanism: Secondary | ICD-10-CM | POA: Diagnosis not present

## 2017-03-01 DIAGNOSIS — E785 Hyperlipidemia, unspecified: Secondary | ICD-10-CM | POA: Diagnosis not present

## 2017-03-01 DIAGNOSIS — M8580 Other specified disorders of bone density and structure, unspecified site: Secondary | ICD-10-CM | POA: Diagnosis not present

## 2017-03-01 DIAGNOSIS — E2839 Other primary ovarian failure: Secondary | ICD-10-CM | POA: Diagnosis not present

## 2017-03-01 DIAGNOSIS — Z1329 Encounter for screening for other suspected endocrine disorder: Secondary | ICD-10-CM

## 2017-03-01 DIAGNOSIS — Z1321 Encounter for screening for nutritional disorder: Secondary | ICD-10-CM | POA: Diagnosis not present

## 2017-03-01 DIAGNOSIS — Z0000 Encounter for general adult medical examination without abnormal findings: Secondary | ICD-10-CM | POA: Diagnosis not present

## 2017-03-01 DIAGNOSIS — I959 Hypotension, unspecified: Secondary | ICD-10-CM

## 2017-03-01 LAB — CBC WITH DIFFERENTIAL/PLATELET
BASOS ABS: 0 {cells}/uL (ref 0–200)
BASOS PCT: 0 %
EOS ABS: 142 {cells}/uL (ref 15–500)
Eosinophils Relative: 2 %
HEMATOCRIT: 41 % (ref 35.0–45.0)
HEMOGLOBIN: 13.3 g/dL (ref 11.7–15.5)
LYMPHS ABS: 2485 {cells}/uL (ref 850–3900)
Lymphocytes Relative: 35 %
MCH: 28.8 pg (ref 27.0–33.0)
MCHC: 32.4 g/dL (ref 32.0–36.0)
MCV: 88.7 fL (ref 80.0–100.0)
MONO ABS: 710 {cells}/uL (ref 200–950)
MPV: 10.1 fL (ref 7.5–12.5)
Monocytes Relative: 10 %
NEUTROS ABS: 3763 {cells}/uL (ref 1500–7800)
Neutrophils Relative %: 53 %
Platelets: 223 10*3/uL (ref 140–400)
RBC: 4.62 MIL/uL (ref 3.80–5.10)
RDW: 14.4 % (ref 11.0–15.0)
WBC: 7.1 10*3/uL (ref 3.8–10.8)

## 2017-03-01 LAB — LIPID PANEL
Cholesterol: 168 mg/dL (ref <200)
HDL: 63 mg/dL (ref >50)
LDL CALC: 86 mg/dL (ref <100)
TRIGLYCERIDES: 94 mg/dL (ref <150)
Total CHOL/HDL Ratio: 2.7 Ratio (ref <5.0)
VLDL: 19 mg/dL (ref <30)

## 2017-03-01 LAB — COMPLETE METABOLIC PANEL WITH GFR
ALBUMIN: 4.2 g/dL (ref 3.6–5.1)
ALK PHOS: 66 U/L (ref 33–130)
ALT: 14 U/L (ref 6–29)
AST: 16 U/L (ref 10–35)
BUN: 13 mg/dL (ref 7–25)
CO2: 26 mmol/L (ref 20–31)
CREATININE: 0.67 mg/dL (ref 0.60–0.93)
Calcium: 8.8 mg/dL (ref 8.6–10.4)
Chloride: 103 mmol/L (ref 98–110)
GFR, EST NON AFRICAN AMERICAN: 88 mL/min (ref >=60)
Glucose, Bld: 77 mg/dL (ref 65–99)
Potassium: 4.4 mmol/L (ref 3.5–5.3)
Sodium: 138 mmol/L (ref 135–146)
TOTAL PROTEIN: 6.1 g/dL (ref 6.1–8.1)
Total Bilirubin: 0.7 mg/dL (ref 0.2–1.2)

## 2017-03-01 LAB — TSH: TSH: 1.3 m[IU]/L

## 2017-03-02 LAB — VITAMIN D 25 HYDROXY (VIT D DEFICIENCY, FRACTURES): Vit D, 25-Hydroxy: 42 ng/mL (ref 30–100)

## 2017-03-04 ENCOUNTER — Encounter: Payer: Self-pay | Admitting: Internal Medicine

## 2017-03-04 ENCOUNTER — Ambulatory Visit (INDEPENDENT_AMBULATORY_CARE_PROVIDER_SITE_OTHER): Payer: PPO | Admitting: Internal Medicine

## 2017-03-04 VITALS — BP 110/60 | HR 66 | Temp 97.6°F | Ht 68.5 in | Wt 156.0 lb

## 2017-03-04 DIAGNOSIS — M6281 Muscle weakness (generalized): Secondary | ICD-10-CM | POA: Diagnosis not present

## 2017-03-04 DIAGNOSIS — K219 Gastro-esophageal reflux disease without esophagitis: Secondary | ICD-10-CM | POA: Diagnosis not present

## 2017-03-04 DIAGNOSIS — F419 Anxiety disorder, unspecified: Secondary | ICD-10-CM

## 2017-03-04 DIAGNOSIS — M545 Low back pain, unspecified: Secondary | ICD-10-CM

## 2017-03-04 DIAGNOSIS — G713 Mitochondrial myopathy, not elsewhere classified: Secondary | ICD-10-CM

## 2017-03-04 DIAGNOSIS — Z950 Presence of cardiac pacemaker: Secondary | ICD-10-CM

## 2017-03-04 DIAGNOSIS — R531 Weakness: Secondary | ICD-10-CM

## 2017-03-04 DIAGNOSIS — M8580 Other specified disorders of bone density and structure, unspecified site: Secondary | ICD-10-CM

## 2017-03-04 DIAGNOSIS — H903 Sensorineural hearing loss, bilateral: Secondary | ICD-10-CM

## 2017-03-04 DIAGNOSIS — F329 Major depressive disorder, single episode, unspecified: Secondary | ICD-10-CM

## 2017-03-04 DIAGNOSIS — F32A Depression, unspecified: Secondary | ICD-10-CM

## 2017-03-04 DIAGNOSIS — F439 Reaction to severe stress, unspecified: Secondary | ICD-10-CM

## 2017-03-04 DIAGNOSIS — Z9181 History of falling: Secondary | ICD-10-CM

## 2017-03-04 DIAGNOSIS — Z0000 Encounter for general adult medical examination without abnormal findings: Secondary | ICD-10-CM | POA: Diagnosis not present

## 2017-03-04 DIAGNOSIS — M797 Fibromyalgia: Secondary | ICD-10-CM

## 2017-03-04 LAB — POCT URINALYSIS DIPSTICK
Bilirubin, UA: NEGATIVE
Blood, UA: NEGATIVE
GLUCOSE UA: NEGATIVE
KETONES UA: NEGATIVE
Leukocytes, UA: NEGATIVE
Nitrite, UA: NEGATIVE
PROTEIN UA: NEGATIVE
SPEC GRAV UA: 1.02 (ref 1.010–1.025)
Urobilinogen, UA: 0.2 E.U./dL
pH, UA: 7 (ref 5.0–8.0)

## 2017-03-04 MED ORDER — LORAZEPAM 2 MG PO TABS: 2.0000 mg | ORAL_TABLET | Freq: Four times a day (QID) | ORAL | 0 refills | Status: DC | PRN

## 2017-03-04 NOTE — Patient Instructions (Signed)
Have LS-spine films done. Return to physical therapy. Psychiatric consult recommended. Discontinue Xanax. Lorazepam 2 mg at bedtime. Prozac is been discontinued. Take Wellbutrin.  We are considering referring you to chronic pain management.
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
## 2017-03-09 ENCOUNTER — Encounter: Payer: Self-pay | Admitting: Cardiology

## 2017-03-12 ENCOUNTER — Encounter: Payer: Self-pay | Admitting: Internal Medicine

## 2017-03-12 DIAGNOSIS — Z1231 Encounter for screening mammogram for malignant neoplasm of breast: Secondary | ICD-10-CM | POA: Diagnosis not present

## 2017-03-12 DIAGNOSIS — M8589 Other specified disorders of bone density and structure, multiple sites: Secondary | ICD-10-CM | POA: Diagnosis not present

## 2017-03-12 DIAGNOSIS — Z803 Family history of malignant neoplasm of breast: Secondary | ICD-10-CM | POA: Diagnosis not present

## 2017-03-15 ENCOUNTER — Other Ambulatory Visit: Payer: Self-pay | Admitting: Internal Medicine

## 2017-03-15 DIAGNOSIS — M8580 Other specified disorders of bone density and structure, unspecified site: Secondary | ICD-10-CM

## 2017-03-17 ENCOUNTER — Encounter: Payer: Self-pay | Admitting: Internal Medicine

## 2017-03-19 LAB — CUP PACEART REMOTE DEVICE CHECK
Brady Statistic RA Percent Paced: 68 %
Implantable Lead Implant Date: 20140710
Implantable Lead Location: 753860
Implantable Lead Model: 346
Implantable Lead Serial Number: 29334372
Implantable Pulse Generator Implant Date: 20140710
Lead Channel Impedance Value: 800 Ohm
Lead Channel Pacing Threshold Amplitude: 0.4 V
Lead Channel Pacing Threshold Pulse Width: 0.5 ms
Lead Channel Sensing Intrinsic Amplitude: 20 mV
MDC IDC LEAD IMPLANT DT: 20140710
MDC IDC LEAD LOCATION: 753859
MDC IDC LEAD SERIAL: 29426855
MDC IDC MSMT LEADCHNL RA IMPEDANCE VALUE: 722 Ohm
MDC IDC MSMT LEADCHNL RA PACING THRESHOLD AMPLITUDE: 0.5 V
MDC IDC MSMT LEADCHNL RA PACING THRESHOLD PULSEWIDTH: 0.4 ms
MDC IDC MSMT LEADCHNL RA SENSING INTR AMPL: 2.4 mV
MDC IDC PG SERIAL: 66385898
MDC IDC SESS DTM: 20180810091311
MDC IDC STAT BRADY RV PERCENT PACED: 98 %

## 2017-03-25 ENCOUNTER — Ambulatory Visit (INDEPENDENT_AMBULATORY_CARE_PROVIDER_SITE_OTHER): Payer: PPO | Admitting: Internal Medicine

## 2017-03-25 ENCOUNTER — Encounter: Payer: Self-pay | Admitting: Internal Medicine

## 2017-03-25 VITALS — BP 102/60 | HR 60 | Temp 97.5°F | Wt 153.0 lb

## 2017-03-25 DIAGNOSIS — K219 Gastro-esophageal reflux disease without esophagitis: Secondary | ICD-10-CM | POA: Diagnosis not present

## 2017-03-25 DIAGNOSIS — F329 Major depressive disorder, single episode, unspecified: Secondary | ICD-10-CM

## 2017-03-25 DIAGNOSIS — R531 Weakness: Secondary | ICD-10-CM

## 2017-03-25 DIAGNOSIS — F419 Anxiety disorder, unspecified: Secondary | ICD-10-CM

## 2017-03-25 DIAGNOSIS — F439 Reaction to severe stress, unspecified: Secondary | ICD-10-CM | POA: Diagnosis not present

## 2017-03-25 DIAGNOSIS — M797 Fibromyalgia: Secondary | ICD-10-CM

## 2017-03-25 DIAGNOSIS — F5102 Adjustment insomnia: Secondary | ICD-10-CM | POA: Diagnosis not present

## 2017-03-25 DIAGNOSIS — F4321 Adjustment disorder with depressed mood: Secondary | ICD-10-CM

## 2017-03-25 DIAGNOSIS — G713 Mitochondrial myopathy, not elsewhere classified: Secondary | ICD-10-CM | POA: Diagnosis not present

## 2017-03-25 DIAGNOSIS — F4320 Adjustment disorder, unspecified: Secondary | ICD-10-CM

## 2017-03-25 MED ORDER — QUETIAPINE FUMARATE 50 MG PO TABS: 50.0000 mg | ORAL_TABLET | Freq: Every day | ORAL | 0 refills | Status: DC

## 2017-03-25 MED ORDER — PANTOPRAZOLE SODIUM 40 MG PO TBEC: 40.0000 mg | DELAYED_RELEASE_TABLET | Freq: Every day | ORAL | 1 refills | Status: DC

## 2017-03-25 MED ORDER — HYDROCODONE-ACETAMINOPHEN 10-325 MG PO TABS: 1.0000 | ORAL_TABLET | Freq: Two times a day (BID) | ORAL | 0 refills | Status: DC

## 2017-03-25 NOTE — Progress Notes (Signed)
   Subjective:    Patient ID: Wendy Rose, female    DOB: 12/06/1945, 71 y.o.   MRN: [id number]  HPI  71 year old Female for chronic pain management follow up. Still with issues sleeping. Last week was rough with musculoskeletal pain but today is better.  Today pain is 5 out of 10. Has appt with Psychiatrist Sept 7th.  She has a history of mitochondrial myopathy and fibromyalgia syndrome. Requires chronic pain medication.  Also has back pain.  Clearly not sleeping and that is making issues with chronic pain worse, I think  Lorazepam is not working.  Continues to drive.  We recently wrote letter for [employer] to deliver mail directly to her porch as she was having issues ambulating to street mailbox.    Review of Systems see above-also having issues with bilateral knee pain. Needs to see orthopedist.     Objective:   Physical Exam Affect is flat. She is anxious about not sleeping. Chest clear. Cardiac exam regular rate and rhythm. Extremities without edema. Spent 25 minutes speaking with her about these issues.       Assessment & Plan:  Insomnia- discontinue Lorazepam and try Seroquel. Keep appt with Psychiatrist  Chronic pain- stable with bid Norco continue seems to take meds appropriately  Hx fibromyalgia- longstanding  Back pain  Mitochondrial myopathy followed at [facility]  Bilateral knee pain- refer to Orthopedics  Grief reaction from husband's death  Depression- no suicidal ideations  GERD- refill PPI  Return in 3 months.

## 2017-04-04 NOTE — Patient Instructions (Signed)
To see Psychiatrist September 7. Change to Seroquel and discontinue lorazepam until she can see psychiatrist. Pain medication refilled

## 2017-04-16 DIAGNOSIS — F39 Unspecified mood [affective] disorder: Secondary | ICD-10-CM | POA: Diagnosis not present

## 2017-04-16 DIAGNOSIS — F419 Anxiety disorder, unspecified: Secondary | ICD-10-CM | POA: Diagnosis not present

## 2017-04-23 ENCOUNTER — Encounter: Payer: Self-pay | Admitting: Internal Medicine

## 2017-04-23 ENCOUNTER — Ambulatory Visit (INDEPENDENT_AMBULATORY_CARE_PROVIDER_SITE_OTHER): Payer: PPO | Admitting: Internal Medicine

## 2017-04-23 VITALS — BP 110/80 | HR 68 | Temp 97.6°F | Wt 148.0 lb

## 2017-04-23 DIAGNOSIS — J069 Acute upper respiratory infection, unspecified: Secondary | ICD-10-CM | POA: Diagnosis not present

## 2017-04-23 MED ORDER — AMOXICILLIN 500 MG PO CAPS: 500.0000 mg | ORAL_CAPSULE | Freq: Three times a day (TID) | ORAL | 0 refills | Status: DC
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
## 2017-04-25 NOTE — Patient Instructions (Signed)
Amoxicillin 500 mg 3 times daily for 10 days. Rest and drink plenty of fluids. Delsym as needed for cough.

## 2017-05-04 ENCOUNTER — Other Ambulatory Visit: Payer: Self-pay | Admitting: Student

## 2017-05-05 NOTE — Telephone Encounter (Signed)
Eliquis 5mg  refill request received; pt is 71 yrs old, Crea-0.67 on 03/01/17, Wt-67.1kg, last seen by [place] on 08/20/16. Will send in refill request to requested Pharmacy.

## 2017-05-06 ENCOUNTER — Encounter (HOSPITAL_COMMUNITY): Payer: Self-pay | Admitting: Emergency Medicine

## 2017-05-06 ENCOUNTER — Emergency Department (HOSPITAL_COMMUNITY): Payer: PPO

## 2017-05-06 DIAGNOSIS — R55 Syncope and collapse: Secondary | ICD-10-CM | POA: Diagnosis not present

## 2017-05-06 DIAGNOSIS — Z043 Encounter for examination and observation following other accident: Secondary | ICD-10-CM | POA: Insufficient documentation

## 2017-05-06 DIAGNOSIS — Y999 Unspecified external cause status: Secondary | ICD-10-CM | POA: Diagnosis not present

## 2017-05-06 DIAGNOSIS — M545 Low back pain: Secondary | ICD-10-CM | POA: Diagnosis not present

## 2017-05-06 DIAGNOSIS — Y929 Unspecified place or not applicable: Secondary | ICD-10-CM | POA: Diagnosis not present

## 2017-05-06 DIAGNOSIS — Y93H2 Activity, gardening and landscaping: Secondary | ICD-10-CM | POA: Insufficient documentation

## 2017-05-06 DIAGNOSIS — S79911A Unspecified injury of right hip, initial encounter: Secondary | ICD-10-CM | POA: Diagnosis not present

## 2017-05-06 DIAGNOSIS — W01198A Fall on same level from slipping, tripping and stumbling with subsequent striking against other object, initial encounter: Secondary | ICD-10-CM | POA: Diagnosis not present

## 2017-05-06 DIAGNOSIS — M25551 Pain in right hip: Secondary | ICD-10-CM | POA: Diagnosis not present

## 2017-05-06 DIAGNOSIS — M542 Cervicalgia: Secondary | ICD-10-CM | POA: Diagnosis not present

## 2017-05-06 DIAGNOSIS — Z79899 Other long term (current) drug therapy: Secondary | ICD-10-CM | POA: Insufficient documentation

## 2017-05-06 DIAGNOSIS — S0990XA Unspecified injury of head, initial encounter: Secondary | ICD-10-CM | POA: Diagnosis not present

## 2017-05-06 DIAGNOSIS — S199XXA Unspecified injury of neck, initial encounter: Secondary | ICD-10-CM | POA: Diagnosis not present

## 2017-05-06 LAB — CBC WITH DIFFERENTIAL/PLATELET
BASOS PCT: 0 %
Basophils Absolute: 0 10*3/uL (ref 0.0–0.1)
EOS ABS: 0.3 10*3/uL (ref 0.0–0.7)
Eosinophils Relative: 3 %
HCT: 37.4 % (ref 36.0–46.0)
Hemoglobin: 12.1 g/dL (ref 12.0–15.0)
Lymphocytes Relative: 29 %
Lymphs Abs: 2.2 10*3/uL (ref 0.7–4.0)
MCH: 28.2 pg (ref 26.0–34.0)
MCHC: 32.4 g/dL (ref 30.0–36.0)
MCV: 87.2 fL (ref 78.0–100.0)
MONO ABS: 0.9 10*3/uL (ref 0.1–1.0)
MONOS PCT: 11 %
Neutro Abs: 4.5 10*3/uL (ref 1.7–7.7)
Neutrophils Relative %: 57 %
Platelets: 234 10*3/uL (ref 150–400)
RBC: 4.29 MIL/uL (ref 3.87–5.11)
RDW: 13.5 % (ref 11.5–15.5)
WBC: 7.8 10*3/uL (ref 4.0–10.5)

## 2017-05-06 LAB — COMPREHENSIVE METABOLIC PANEL
ALBUMIN: 3.7 g/dL (ref 3.5–5.0)
ALT: 27 U/L (ref 14–54)
ANION GAP: 7 (ref 5–15)
AST: 39 U/L (ref 15–41)
Alkaline Phosphatase: 68 U/L (ref 38–126)
BILIRUBIN TOTAL: 0.5 mg/dL (ref 0.3–1.2)
BUN: 14 mg/dL (ref 6–20)
CO2: 24 mmol/L (ref 22–32)
Calcium: 8.7 mg/dL — ABNORMAL LOW (ref 8.9–10.3)
Chloride: 111 mmol/L (ref 101–111)
Creatinine, Ser: 0.63 mg/dL (ref 0.44–1.00)
GFR calc non Af Amer: >60 mL/min (ref >60)
GLUCOSE: 103 mg/dL — AB (ref 65–99)
POTASSIUM: 3.7 mmol/L (ref 3.5–5.1)
SODIUM: 142 mmol/L (ref 135–145)
TOTAL PROTEIN: 5.9 g/dL — AB (ref 6.5–8.1)

## 2017-05-06 LAB — PROTIME-INR
INR: 1.02
Prothrombin Time: 13.3 seconds (ref 11.4–15.2)

## 2017-05-06 NOTE — ED Triage Notes (Addendum)
Patient missed her step and fell at home this evening , denies LOC ,ambulatory , reports pain at posterior neck  and left shoulder/left upper back pain . Alert and oriented /respirations unlabored . C- collar applied at triage .

## 2017-05-07 ENCOUNTER — Emergency Department (HOSPITAL_COMMUNITY)
Admission: EM | Admit: 2017-05-07 | Discharge: 2017-05-07 | Disposition: A | Discharge status: Home / Self Care | Payer: PPO | Attending: Emergency Medicine | Admitting: Emergency Medicine

## 2017-05-07 ENCOUNTER — Emergency Department (HOSPITAL_COMMUNITY): Payer: PPO

## 2017-05-07 DIAGNOSIS — S0990XA Unspecified injury of head, initial encounter: Secondary | ICD-10-CM | POA: Diagnosis not present

## 2017-05-07 DIAGNOSIS — R52 Pain, unspecified: Secondary | ICD-10-CM

## 2017-05-07 DIAGNOSIS — M545 Low back pain: Secondary | ICD-10-CM | POA: Diagnosis not present

## 2017-05-07 DIAGNOSIS — M25551 Pain in right hip: Secondary | ICD-10-CM | POA: Diagnosis not present

## 2017-05-07 DIAGNOSIS — W19XXXA Unspecified fall, initial encounter: Secondary | ICD-10-CM

## 2017-05-07 DIAGNOSIS — S199XXA Unspecified injury of neck, initial encounter: Secondary | ICD-10-CM | POA: Diagnosis not present

## 2017-05-07 DIAGNOSIS — S79911A Unspecified injury of right hip, initial encounter: Secondary | ICD-10-CM | POA: Diagnosis not present

## 2017-05-07 HISTORY — DX: Unspecified atrial fibrillation: I48.91

## 2017-05-07 NOTE — ED Notes (Signed)
Patient transported to CT 

## 2017-05-07 NOTE — ED Notes (Signed)
Pt ambulated in hallway with walker from home. Pt has steady gait and denies any dizziness and shob.

## 2017-05-07 NOTE — ED Provider Notes (Signed)
[facility] DEPT Provider Note   CSN: [id number] Arrival date & time: 05/06/17  1920     History   Chief Complaint Chief Complaint  Patient presents with  . Fall    Anticoagulant( Eliquis)    HPI Wendy Rose is a 71 y.o. female.  Patient presents to the emergency department with chief complaint of fall.  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 She states that today she was planting flowers, and fell backward landing on her right hip and buttocks. He states that she became concerned because she takes a look with Nicki Reaper and recently lost her husband due to a similar type fall in which he did not sustain a head injury, but died from a brain bleed. She denies any numbness, weakness, or tingling. She was ambulatory after the fall. She was able to pick herself up off the ground. She denies any vision or speech changes. There are no modifying factors. There are no other associated symptoms.   The history is provided by the patient. No language interpreter was used.    Past Medical History:  Diagnosis Date  . A-fib ([place])   . Anemia   . Anxiety   . Arthritis   . Chronic headaches   . Depression   . Fibromyalgia   . GERD (gastroesophageal reflux disease)   . History of echocardiogram    a. echo 02/2013: normal LV function with an EF of 55-60%, mild MR, mild TR, and PA peak pressure of 40 mmHg  . Hyperlipemia   . IBS (irritable bowel syndrome)   . MVP (mitral valve prolapse)   . Pneumonia   . PONV (postoperative nausea and vomiting)   . S/P cardiac pacemaker procedure, 02/16/13, Bio tronik device 02/19/2013  . Symptomatic bradycardia    s/p Biotronik Evia dual chamber pacemaker implant 02/16/2013 by Dr Rayann Heman  . UTI (lower urinary tract infection)   . Vertigo     Patient Active Problem List   Diagnosis Date Noted  . Symptomatic bradycardia 08/20/2016  . Mixed conductive and sensorineural hearing loss of both ears 12/05/2015  . Cough 06/13/2015  .  Atypical atrial flutter ([place]) 07/02/2014  . Idiopathic peripheral neuropathy 04/05/2014  . Shortness of breath 03/23/2013  . S/P cardiac pacemaker procedure, 02/16/13, Bio tronik device 02/19/2013  . UTI (urinary tract infection) 02/17/2013  . Sick sinus syndrome ([place]) 02/14/2013  . Hypotension 02/14/2013  . Near syncope 02/14/2013  . Cephalalgia 07/14/2012  . Mitochondrial myopathy 06/10/2012  . Proximal muscle weakness 06/10/2012  . Hyperlipidemia 06/16/2011  . Depression 06/16/2011  . Fibromyalgia syndrome 06/16/2011  . Anxiety 06/16/2011  . History of mitral valve prolapse 06/16/2011  . Seasonal and perennial allergic rhinitis 06/16/2011  . GE reflux 06/16/2011  . History of migraine headaches 06/16/2011    Past Surgical History:  Procedure Laterality Date  . BLADDER SUSPENSION    . CESAREAN SECTION    . DILATION AND CURETTAGE OF UTERUS    . PACEMAKER INSERTION  02/16/2013   Biotronik Evia dual chamber pacemaker implanted by Dr Rayann Heman  . PERMANENT PACEMAKER INSERTION N/A 02/16/2013   Procedure: PERMANENT PACEMAKER INSERTION;  Surgeon: Thompson Grayer, MD;  Location: [facility] C[facility] LAB;  Service: Cardiovascular;  Laterality: N/A;  . TONSILLECTOMY AND ADENOIDECTOMY    . TOTAL ABDOMINAL HYSTERECTOMY    . TYMPANOPLASTY      OB History    No data available       Home Medications    Prior to Admission medications  Medication Sig Start Date End Date Taking? Authorizing Provider  amoxicillin (AMOXIL) 500 MG capsule Take 1 capsule (500 mg total) by mouth 3 (three) times daily. 04/23/17   Elby Showers, MD  atorvastatin (LIPITOR) 20 MG tablet Take 1 tablet (20 mg total) by mouth daily at 6 PM. 08/20/16   Strader, [place] M, PA-C  B Complex-C (B-COMPLEX WITH VITAMIN C) tablet Take 1 tablet by mouth daily.    [provider]  buPROPion (WELLBUTRIN XL) 150 MG 24 hr tablet Take 150 mg by mouth every morning. 04/17/17   [provider]  calcium-vitamin D (CALCIUM 500+D)  500-200 MG-UNIT per tablet Take 1 tablet by mouth 2 (two) times daily.     [provider]  co-enzyme Q-10 50 MG capsule Take 50 mg by mouth 2 (two) times daily.    [provider]  Creatinine POWD Take by mouth as directed.  08/11/12   [provider]  ELIQUIS 5 MG TABS tablet take 1 tablet by mouth twice a day 05/05/17   Troy Sine, MD  FLUoxetine (PROZAC) 40 MG capsule Take 1 capsule (40 mg total) by mouth daily. 04/30/16   Elby Showers, MD  HYDROcodone-acetaminophen (NORCO) 10-325 MG tablet Take 1 tablet by mouth every 12 (twelve) hours. 03/25/17   Elby Showers, MD  Magnesium Oxide (MAG-200 PO) Take 1 tablet by mouth 2 (two) times daily.     [provider]  OLANZapine (ZYPREXA) 5 MG tablet  04/19/17   [provider]  pantoprazole (PROTONIX) 40 MG tablet Take 1 tablet (40 mg total) by mouth daily. 03/25/17   Elby Showers, MD    Family History Family History  Problem Relation Age of Onset  . Hypertension Mother   . Heart disease Father   . Diabetes Sister   . Heart disease Brother   . Colon cancer Neg Hx   . Esophageal cancer Neg Hx   . Stomach cancer Neg Hx   . Rectal cancer Neg Hx     Social History Social History  Substance Use Topics  . Smoking status: Never Smoker  . Smokeless tobacco: Never Used  . Alcohol use No     Allergies   Contrast media [iodinated diagnostic agents]; Ciprofloxacin; Sulfa antibiotics; Cefuroxime axetil; Codeine; Naproxen; Nitrofurantoin; Sulfonamide derivatives; and Sulindac   Review of Systems Review of Systems  All other systems reviewed and are negative.    Physical Exam Updated Vital Signs BP (!) 142/68 (BP Location: Left Arm)   Pulse (!) 58   Temp 98.1 F (36.7 C) (Oral)   Resp 20   Ht 5\' 10"  (1.778 m)   Wt 69.9 kg (154 lb)   SpO2 97%   BMI 22.10 kg/m   Physical Exam  Constitutional: She is oriented to person, place, and time. She appears well-developed and well-nourished.    Wearing soft collar  HENT:  Head: Normocephalic and atraumatic.  No evidence of traumatic head injury  Eyes: Pupils are equal, round, and reactive to light. Conjunctivae and EOM are normal.  Neck: Normal range of motion. Neck supple.  Cardiovascular: Normal rate and regular rhythm.  Exam reveals no gallop and no friction rub.   No murmur heard. Pulmonary/Chest: Effort normal and breath sounds normal. No respiratory distress. She has no wheezes. She has no rales. She exhibits no tenderness.  Abdominal: Soft. Bowel sounds are normal. She exhibits no distension and no mass. There is no tenderness. There is no rebound and no guarding.  Musculoskeletal: Normal range of motion. She exhibits no edema or tenderness.  Moves all extremities  Neurological: She is alert and oriented to person, place, and time.  CN III-12 intact Speech is clear Movements are goal oriented  Skin: Skin is warm and dry.  no lacerations or abrasions  Psychiatric: She has a normal mood and affect. Her behavior is normal. Judgment and thought content normal.  Nursing note and vitals reviewed.    ED Treatments / Results  Labs (all labs ordered are listed, but only abnormal results are displayed) Labs Reviewed  COMPREHENSIVE METABOLIC PANEL - Abnormal; Notable for the following:       Result Value   Glucose, Bld 103 (*)    Calcium 8.7 (*)    Total Protein 5.9 (*)    All other components within normal limits  CBC WITH DIFFERENTIAL/PLATELET  PROTIME-INR    EKG  EKG Interpretation None       Radiology Dg Cervical Spine Complete  Result Date: 05/06/2017 CLINICAL DATA:  Fall with neck pain EXAM: CERVICAL SPINE - COMPLETE 4+ VIEW COMPARISON:  None. FINDINGS: Suboptimal visualization of the dens. The lateral masses are within normal limits. Left carotid artery calcification. Cervical alignment within normal limits. Vertebral body heights appear normal. Minimal degenerative change at C5-C6 and C6-C7. Prevertebral  soft tissue thickness appears normal IMPRESSION: Minimal degenerative changes.  No acute osseous abnormality. Electronically Signed   By: Donavan Foil M.D.   On: 05/06/2017 21:22    Procedures Procedures (including critical care time)  Medications Ordered in ED Medications - No data to display   Initial Impression / Assessment and Plan / ED Course  I have reviewed the triage vital signs and the nursing notes.  Pertinent labs & imaging results that were available during my care of the patient were reviewed by me and considered in my medical decision making (see chart for details).     Patient on Eliquis. Sustained a mechanical fall tonight.  She is concerned about head bleed because of losing her husband to a head bleed after a fall in which he did not hit his head.  Will check appropriate imaging and reassess.  Imaging is all negative for acute process.  Patient seen by and discussed with Dr. Leonides Schanz, who agrees with plan for discharge.   Final Clinical Impressions(s) / ED Diagnoses   Final diagnoses:  Fall, initial encounter    New Prescriptions New Prescriptions   No medications on file     Montine Circle, PA-C 05/07/17 0401    Ward, Delice Bison, DO 05/07/17 [phone]

## 2017-05-11 ENCOUNTER — Ambulatory Visit (INDEPENDENT_AMBULATORY_CARE_PROVIDER_SITE_OTHER): Payer: PPO | Admitting: Internal Medicine

## 2017-05-11 ENCOUNTER — Encounter: Payer: Self-pay | Admitting: Internal Medicine

## 2017-05-11 VITALS — BP 118/78 | HR 60 | Temp 97.7°F | Wt 160.0 lb

## 2017-05-11 DIAGNOSIS — M7918 Myalgia, other site: Secondary | ICD-10-CM | POA: Diagnosis not present

## 2017-05-11 DIAGNOSIS — G713 Mitochondrial myopathy, not elsewhere classified: Secondary | ICD-10-CM

## 2017-05-11 DIAGNOSIS — R609 Edema, unspecified: Secondary | ICD-10-CM

## 2017-05-11 DIAGNOSIS — Z23 Encounter for immunization: Secondary | ICD-10-CM | POA: Diagnosis not present

## 2017-05-11 DIAGNOSIS — W19XXXD Unspecified fall, subsequent encounter: Secondary | ICD-10-CM

## 2017-05-11 DIAGNOSIS — M7989 Other specified soft tissue disorders: Secondary | ICD-10-CM

## 2017-05-11 IMAGING — CR DG CHEST 2V
2 series · 2 of 2 positions shown · non-contrast
Comparison: 02/17/2013

CLINICAL DATA: Chest pain.  Recurrent dizzy spells

EXAM:
CHEST  2 VIEW

[chest pa]
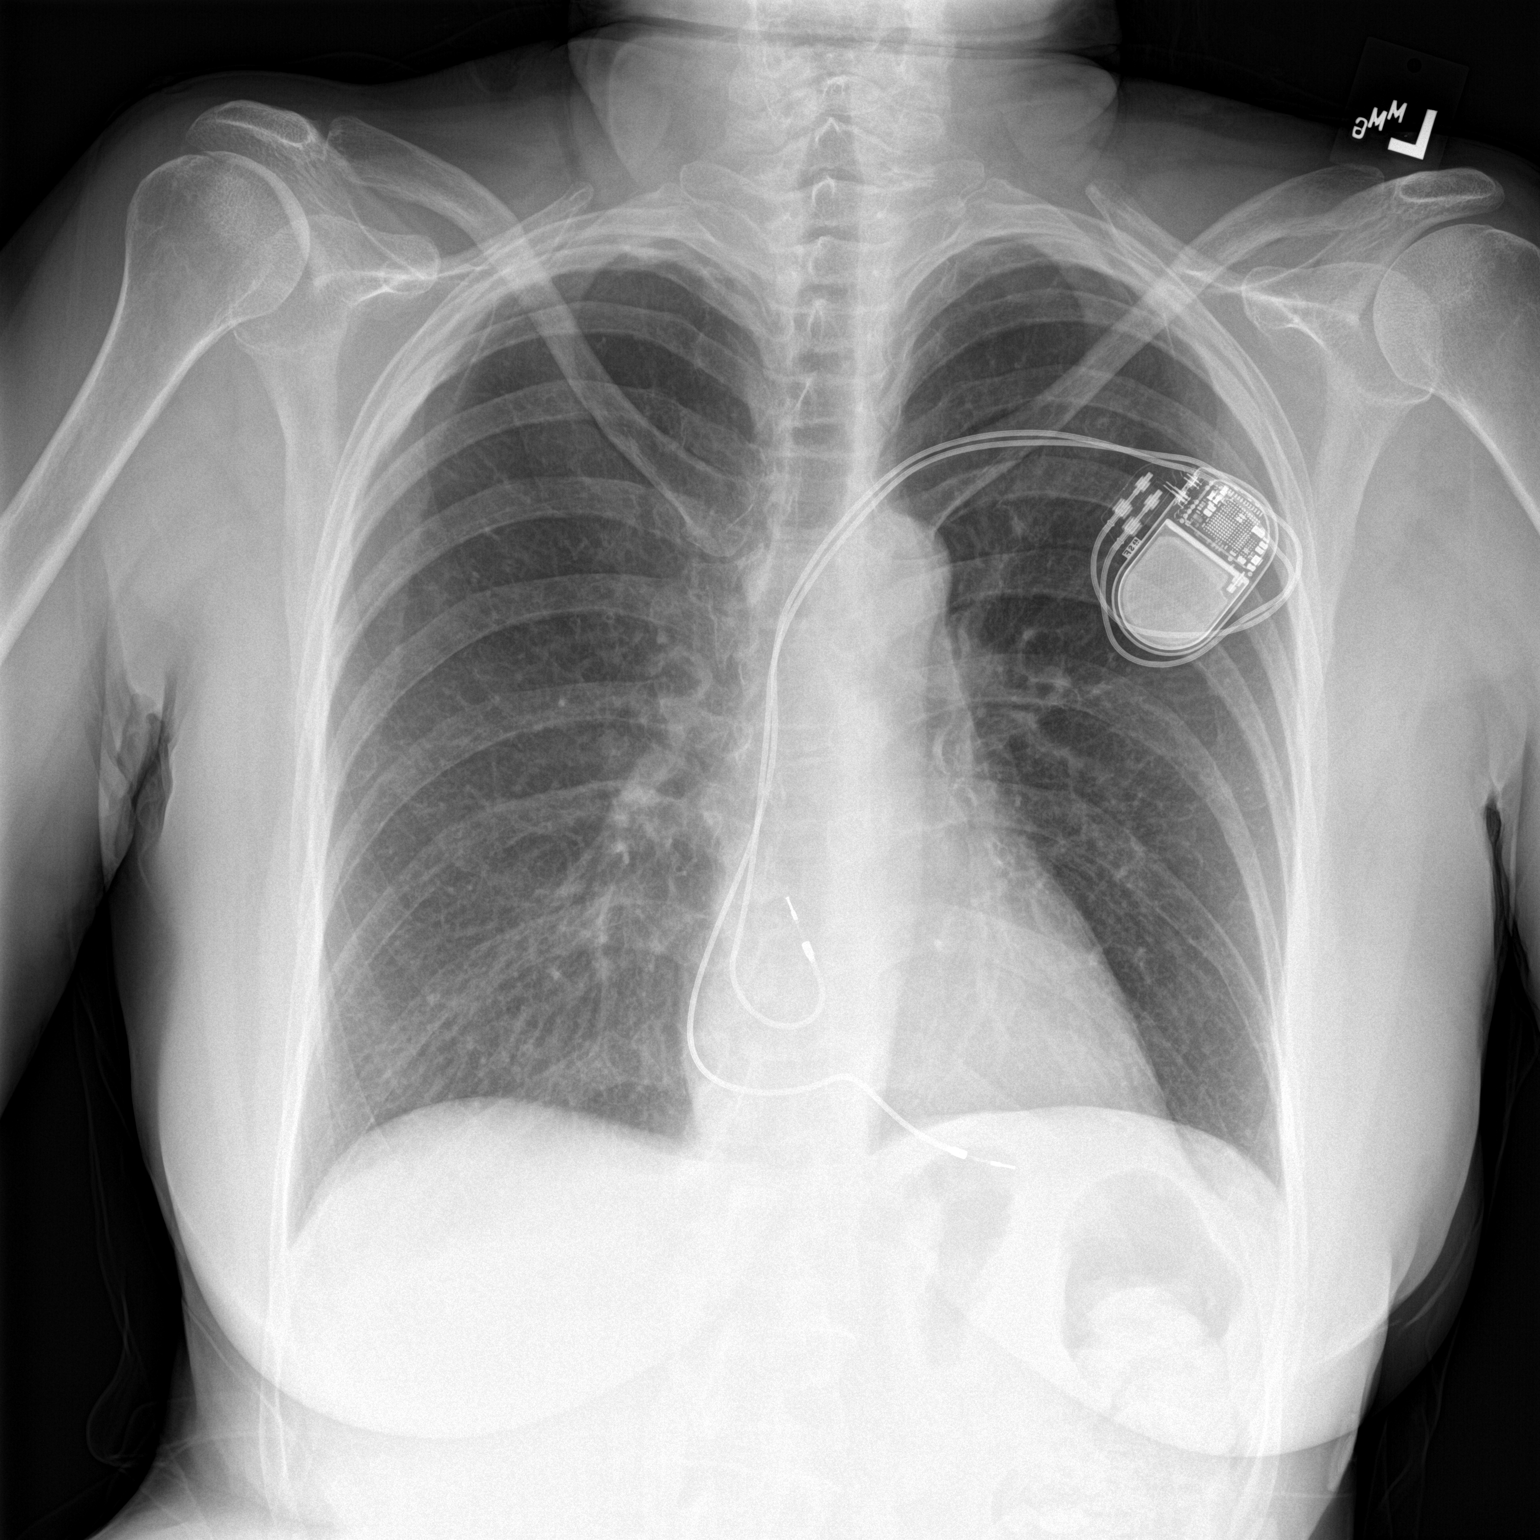

[chest lat]
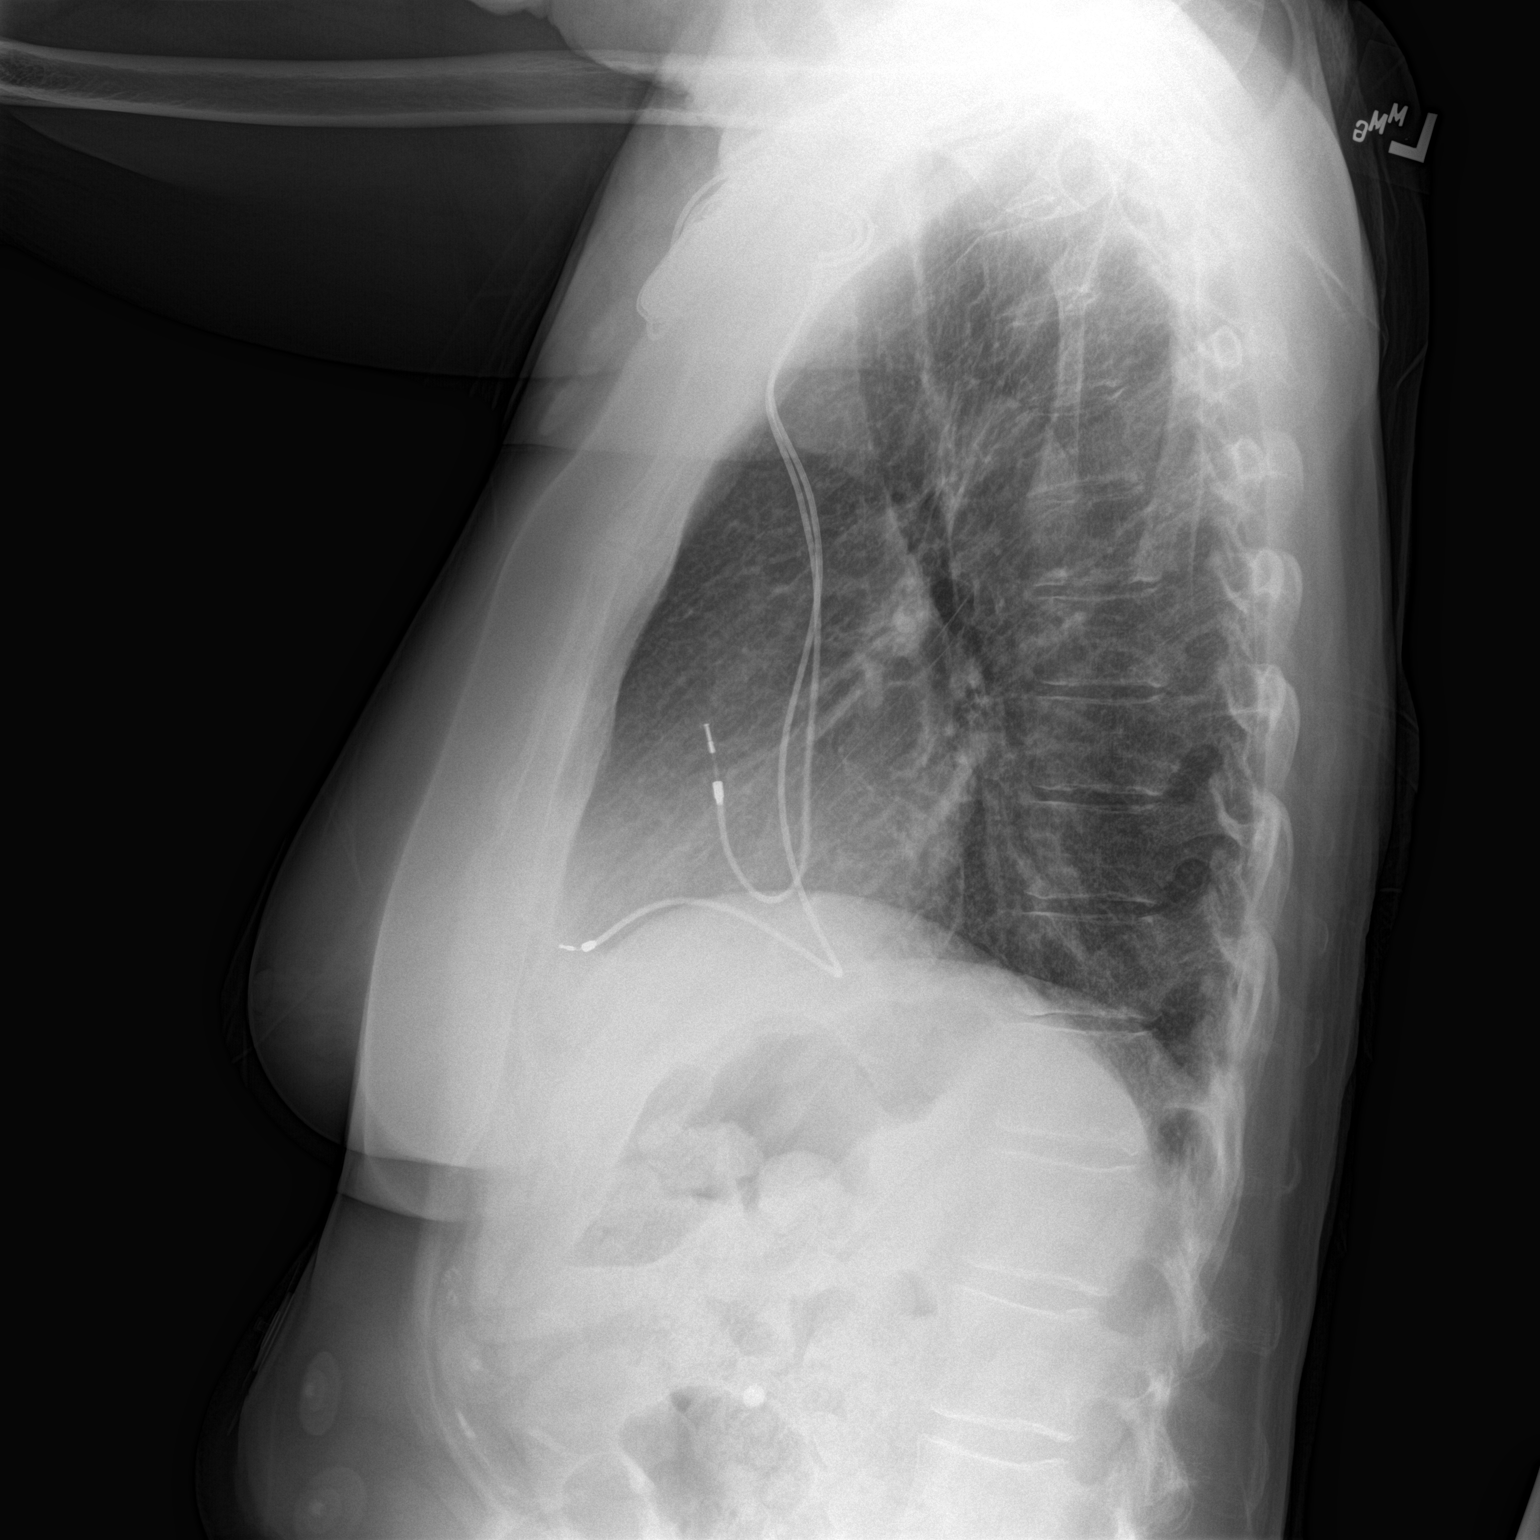

[2 of 2 positions shown; findings below may reference images not displayed]

FINDINGS: Left-sided pacemaker overlies normal cardiac silhouette. No
effusion, infiltrate, or pneumothorax. No acute osseous abnormality.
IMPRESSION: No acute cardiopulmonary process.

## 2017-05-11 MED ORDER — CLOTRIMAZOLE-BETAMETHASONE 1-0.05 % EX CREA: 1.0000 "application " | TOPICAL_CREAM | Freq: Two times a day (BID) | CUTANEOUS | 1 refills | Status: DC

## 2017-05-11 NOTE — Patient Instructions (Signed)
Flu vaccine given. BNP ordered. Physical therapy ordered for right buttock pain. Return as needed.

## 2017-05-11 NOTE — Progress Notes (Signed)
   Subjective:    Patient ID: Wendy Rose, female    DOB: 09/03/1945, 71 y.o.   MRN: [id number]  HPI 71 year old Female who suffered fall out side of home. Right leg gave way going up steps. Had been planting flowers. No LOC. Went to ED and was checked out. Not found to have any fractures. Landed on right hip.  Pt arrived at [facility] on Sept 27 and was discharged Sept 28 at 8:30 am.  Has extensive Xrays of right hip, Cspine, LS spine, hips and pelvis. Also had Ct without contrast. She was complaining of pain right buttock- still sore there and will be referred to PT  Has rash c/w intertrigo around umbilicus and waist.  Review of Systems  Respiratory: Positive for shortness of breath.   Cardiovascular: Negative for chest pain.       Mild edema of feet  Musculoskeletal:       Right buttock pain       Objective:   Physical Exam  Constitutional: She is oriented to person, place, and time. She appears well-developed and well-nourished. No distress.  Cardiovascular: Normal rate and regular rhythm.   She has some mild dependent edema of her feet that is nonpitting.  Pulmonary/Chest: She has no wheezes. She has no rales.  Musculoskeletal:  Tender over right buttock. No bruising. Able to ambulate with a cane.  Neurological: She is alert and oriented to person, place, and time.  Skin: Skin is warm and dry. No rash noted. She is not diaphoretic.  Psychiatric: She has a normal mood and affect. Her behavior is normal. Judgment and thought content normal.  Vitals reviewed.         Assessment & Plan:  Dependent edema feet-continue to monitor. BNP ordered.  Right buttock pain-musculoskeletal in nature secondary to fall. Had extensive x-rays in emergency department and no fractures noted. Order physical therapy.  Mitochondrial myopathy-causing generalized weakness that likely led to her fall  Insomnia-seeing psychiatrist but still not sleeping well. Didn't go to sleep until 3 AM this  morning  Plan: Flu vaccine given. Physical therapy ordered. Lab work pending

## 2017-05-12 LAB — BRAIN NATRIURETIC PEPTIDE: BRAIN NATRIURETIC PEPTIDE: 101 pg/mL — AB (ref <100)

## 2017-05-12 IMAGING — CT CT HEAD W/O CM
2 series · 16 of 30 positions shown, 18 images · non-contrast
Comparison: 11/21/2014.

CLINICAL DATA: Dizziness. BILATERAL arm weakness, and neck pain.
Recent fall. History of anticoagulation.

EXAM:
CT HEAD WITHOUT CONTRAST
TECHNIQUE: Contiguous axial images were obtained from the base of the skull
through the vertex without intravenous contrast.

[Series 201: head w/o, idose (1) · axial · non-contrast · 0.49mm/px · z∈[+80,+195]mm · 8 of 31 slices shown, 10 images]
[im 4/31  brain]
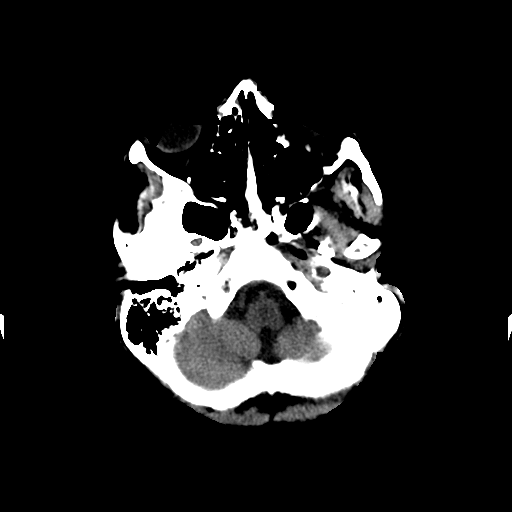
[im 4/31  bone]
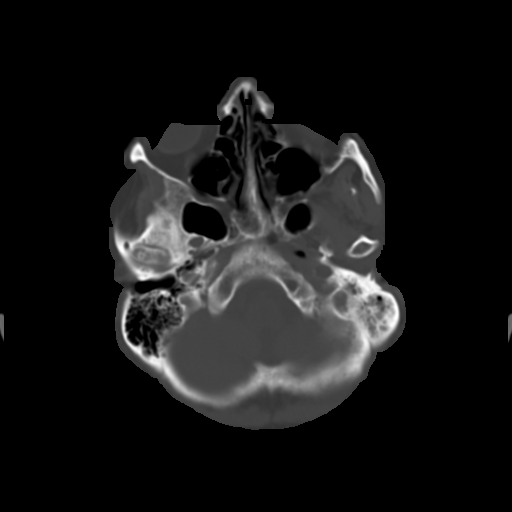
[im 7/31  brain]
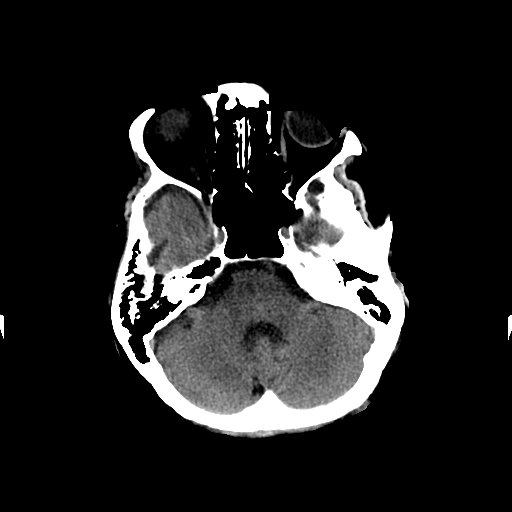
[im 11/31  brain]
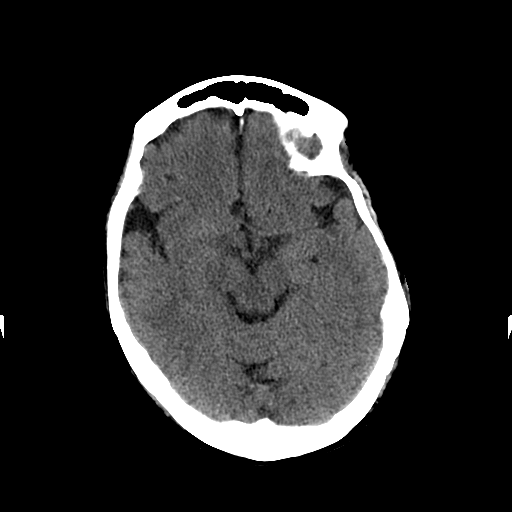
[im 14/31  brain]
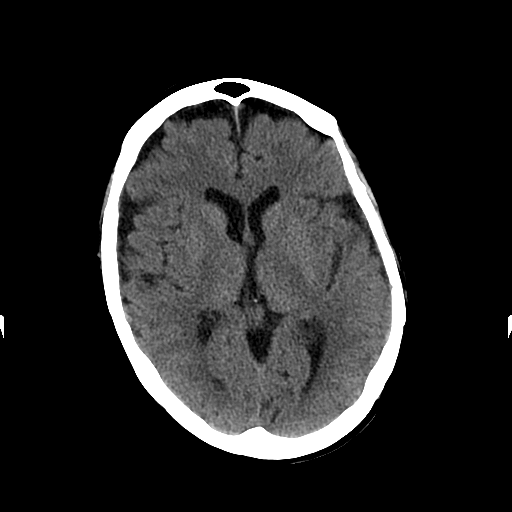
[im 17/31  brain]
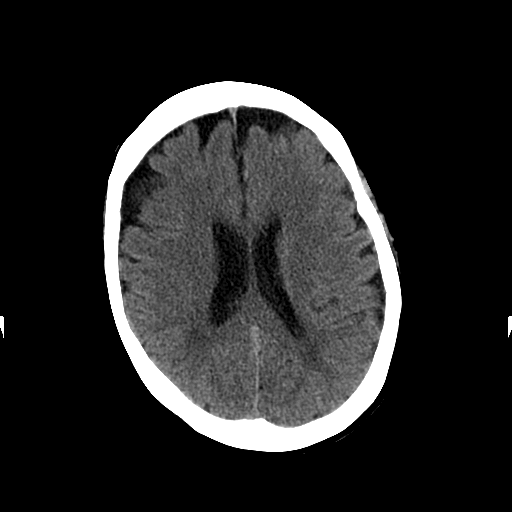
[im 17/31  bone]
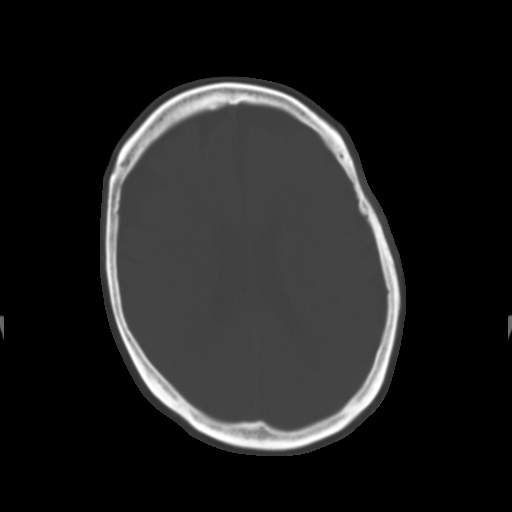
[im 21/31  brain]
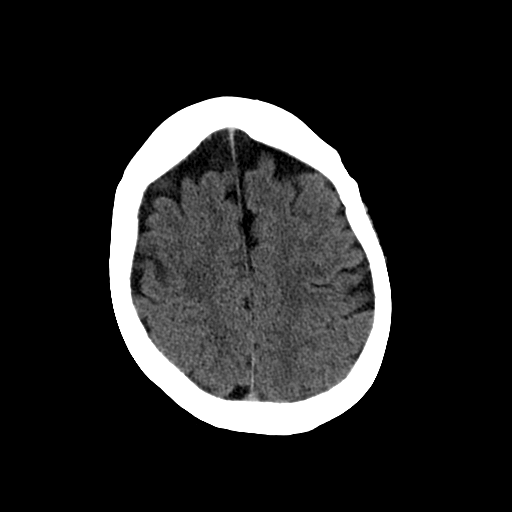
[im 24/31  brain]
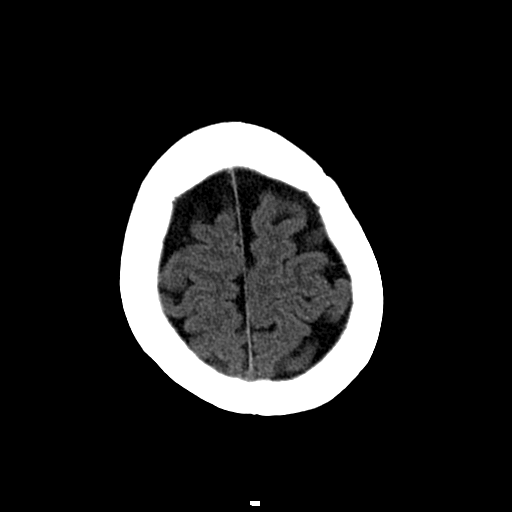
[im 27/31  brain]
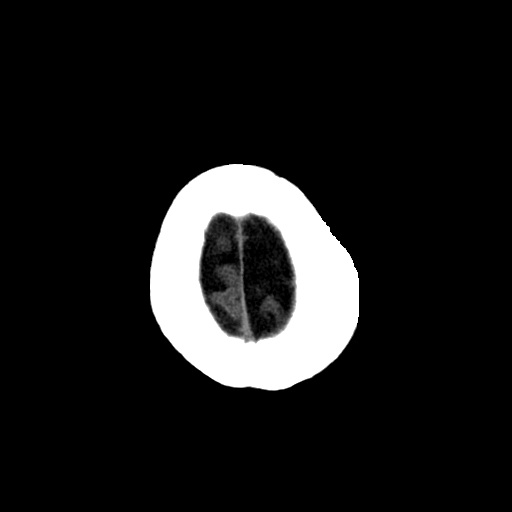

[Series 202: head w/o bone, idose (1) · axial · non-contrast · 0.49mm/px · z∈[+79,+199]mm · 8 of 62 slices shown]
[im 7/62  bone]
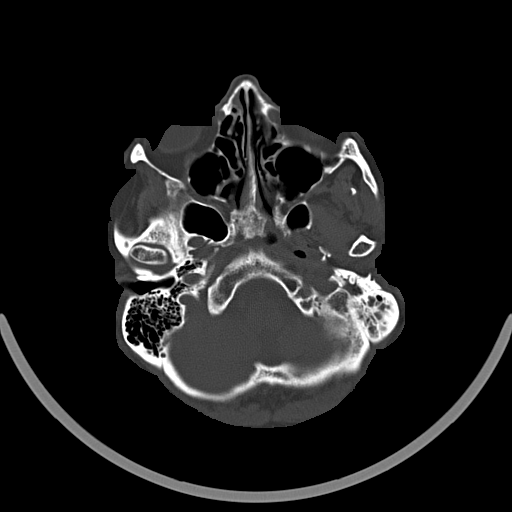
[im 13/62  bone]
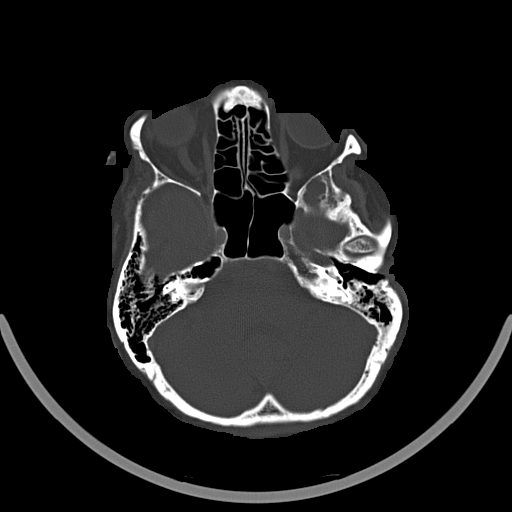
[im 20/62  bone]
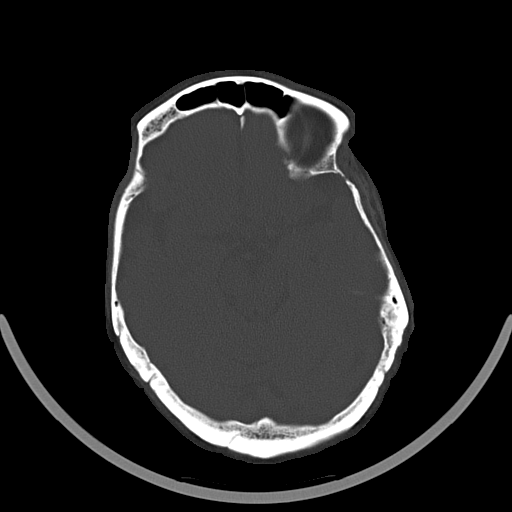
[im 26/62  bone]
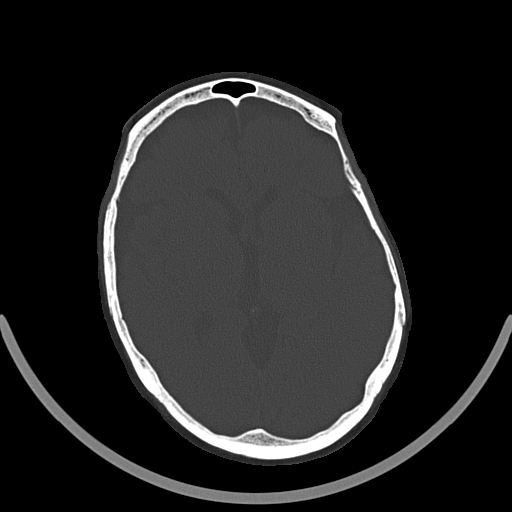
[im 36/62  bone]
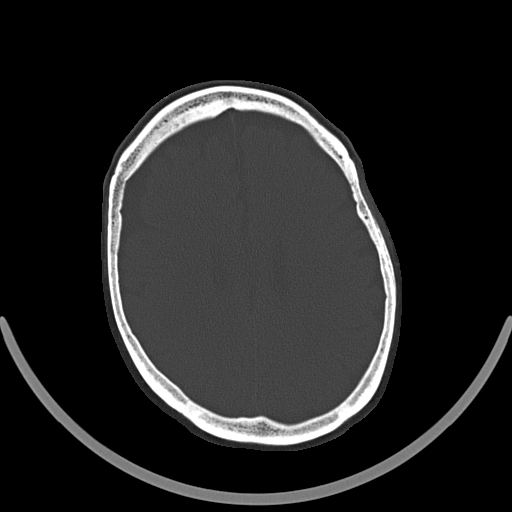
[im 42/62  bone]
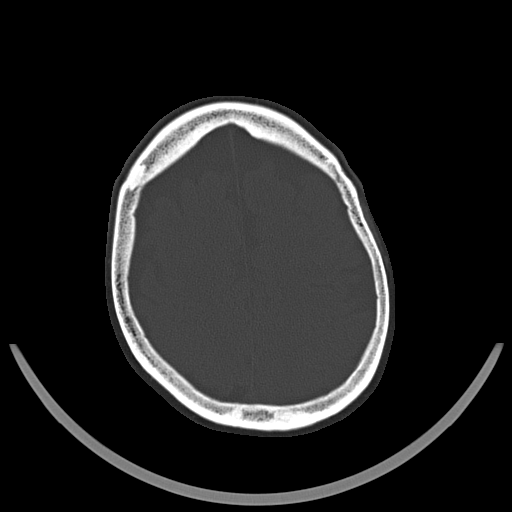
[im 49/62  bone]
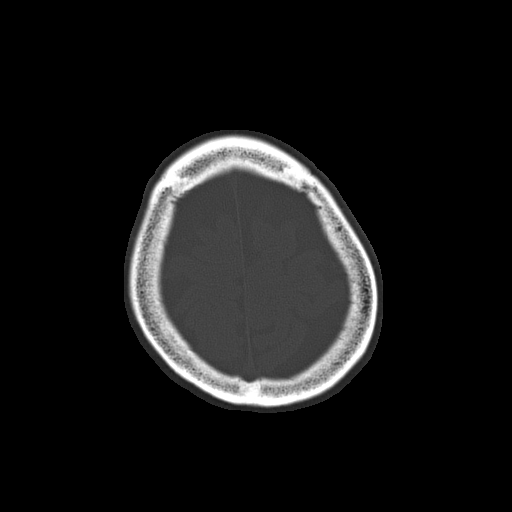
[im 55/62  bone]
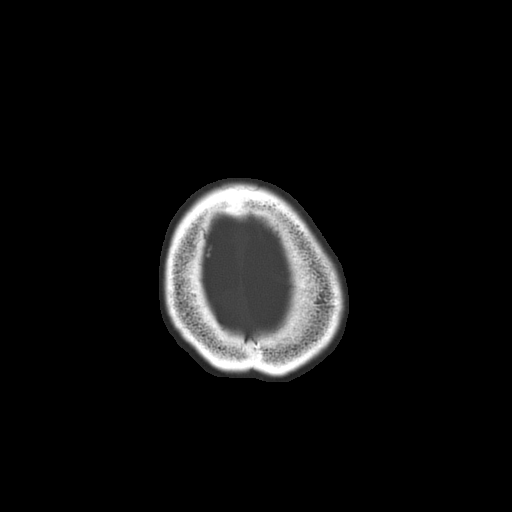

[16 of 30 positions shown; findings below may reference images not displayed]

FINDINGS: Generalized atrophy, with prominence of the frontal and temporal CSF
spaces. Slight white matter hypoattenuation, likely small vessel
disease. No acute stroke or hemorrhage. No intracranial mass lesion
or posttraumatic extra-axial fluid collection. Calvarium intact.
LEFT mastoid fluid persists but is improved when compared with
Alain Thierry. No sinus air-fluid level or RIGHT mastoid fluid. Negative
orbits.
IMPRESSION: Frontotemporal atrophy. No acute intracranial findings. No
posttraumatic sequelae. Improved LEFT mastoid disease.

## 2017-05-13 ENCOUNTER — Telehealth: Payer: Self-pay

## 2017-05-13 NOTE — Telephone Encounter (Signed)
Called patient because we got a refill request on Prozac 40 mg, and now that patient is seeing a psychiatrist Dr. Renold Genta wanted me to check that the dosage has not been changed before refilling to [facility]. LM to have patient call me back.

## 2017-05-14 DIAGNOSIS — F39 Unspecified mood [affective] disorder: Secondary | ICD-10-CM | POA: Diagnosis not present

## 2017-05-14 NOTE — Telephone Encounter (Signed)
Sent message to patient in mychart since we have not been able to reach her by phone

## 2017-05-17 NOTE — Telephone Encounter (Signed)
LMTCB have been unable to reach patient

## 2017-05-19 ENCOUNTER — Ambulatory Visit (INDEPENDENT_AMBULATORY_CARE_PROVIDER_SITE_OTHER): Payer: PPO | Admitting: *Deleted

## 2017-05-19 DIAGNOSIS — I495 Sick sinus syndrome: Secondary | ICD-10-CM

## 2017-05-19 NOTE — Progress Notes (Signed)
Remote pacemaker transmission.   

## 2017-05-21 ENCOUNTER — Encounter: Payer: Self-pay | Admitting: Cardiology

## 2017-05-21 MED ORDER — FLUOXETINE HCL 40 MG PO CAPS: 40.0000 mg | ORAL_CAPSULE | Freq: Every day | ORAL | 3 refills | Status: DC

## 2017-05-21 NOTE — Addendum Note (Signed)
Addended by: Drucilla Schmidt on: 05/21/2017 03:34 PM   Modules accepted: Orders

## 2017-05-21 NOTE — Telephone Encounter (Signed)
Pt called back and spoke with Dr. Renold Genta and she is taking Prozac so rx was filled for a year

## 2017-05-31 LAB — CUP PACEART REMOTE DEVICE CHECK
Brady Statistic AS VS Percent: 0 %
Brady Statistic RA Percent Paced: 1 %
Brady Statistic RV Percent Paced: 93 %
Date Time Interrogation Session: 20181022054055
Implantable Lead Implant Date: 20140710
Implantable Lead Location: 753859
Implantable Lead Model: 346
Implantable Lead Serial Number: 29426855
Implantable Pulse Generator Implant Date: 20140710
Lead Channel Impedance Value: 741 Ohm
Lead Channel Impedance Value: 797 Ohm
Lead Channel Pacing Threshold Pulse Width: 0.4 ms
MDC IDC LEAD IMPLANT DT: 20140710
MDC IDC LEAD LOCATION: 753860
MDC IDC LEAD SERIAL: 29334372
MDC IDC MSMT LEADCHNL RA PACING THRESHOLD AMPLITUDE: 0.5 V
MDC IDC MSMT LEADCHNL RA PACING THRESHOLD PULSEWIDTH: 0.4 ms
MDC IDC MSMT LEADCHNL RV PACING THRESHOLD AMPLITUDE: 0.4 V
MDC IDC SET LEADCHNL RV PACING PULSEWIDTH: 0.4 ms
MDC IDC STAT BRADY AP VP PERCENT: 20 %
MDC IDC STAT BRADY AP VS PERCENT: 0 %
MDC IDC STAT BRADY AS VP PERCENT: 49 %
Pulse Gen Serial Number: 66385898

## 2017-06-02 ENCOUNTER — Telehealth: Payer: Self-pay | Admitting: Internal Medicine

## 2017-06-02 NOTE — Telephone Encounter (Signed)
Patient is calling for refill on her Norco 10-325.   She would like to pick this up tomorrow.  She has her 3 mo f/u pain management scheduled with you in November.  She's going out of town in December for about a month.

## 2017-06-03 ENCOUNTER — Telehealth: Payer: Self-pay | Admitting: Internal Medicine

## 2017-06-03 MED ORDER — HYDROCODONE-ACETAMINOPHEN 10-325 MG PO TABS: 1.0000 | ORAL_TABLET | Freq: Two times a day (BID) | ORAL | 0 refills | Status: DC

## 2017-06-03 NOTE — Telephone Encounter (Signed)
Is psychiatrist handling this now?

## 2017-06-03 NOTE — Telephone Encounter (Signed)
Pt stated that she left it all how it was but she added one medication for sleep. Advised pt to call her office to see about the refill on this medication. Will call back if needed.

## 2017-06-03 NOTE — Telephone Encounter (Signed)
LVM that prescription will be ready

## 2017-06-03 NOTE — Telephone Encounter (Signed)
Patient calling to see if we can call in her Xanax.    Pharmacy:  [place] # for contact:  [phone]  Thank you.

## 2017-06-03 NOTE — Telephone Encounter (Signed)
Refill once 

## 2017-06-04 ENCOUNTER — Encounter: Payer: Self-pay | Admitting: Cardiology

## 2017-06-07 ENCOUNTER — Ambulatory Visit (INDEPENDENT_AMBULATORY_CARE_PROVIDER_SITE_OTHER): Payer: PPO | Admitting: Internal Medicine

## 2017-06-07 ENCOUNTER — Encounter: Payer: Self-pay | Admitting: Internal Medicine

## 2017-06-07 VITALS — BP 130/50 | HR 75 | Ht 70.0 in | Wt 163.0 lb

## 2017-06-07 DIAGNOSIS — I481 Persistent atrial fibrillation: Secondary | ICD-10-CM

## 2017-06-07 DIAGNOSIS — I495 Sick sinus syndrome: Secondary | ICD-10-CM

## 2017-06-07 DIAGNOSIS — I4819 Other persistent atrial fibrillation: Secondary | ICD-10-CM

## 2017-06-07 DIAGNOSIS — I484 Atypical atrial flutter: Secondary | ICD-10-CM | POA: Diagnosis not present

## 2017-06-07 NOTE — Progress Notes (Signed)
PCP: Elby Showers, MD  Primary Cardiologist:  Dr Claiborne Billings Primary EP:  Dr Rayann Heman  Wendy Rose is a 71 y.o. female who presents today for routine electrophysiology followup.  Since last being seen in our clinic, the patient reports doing reasonably well.  She is followed at [facility] for her mitochondrial disease.  Her husband died in 13-Sep-2022 and she is mourning this. Today, she denies symptoms of palpitations, chest pain, shortness of breath,  lower extremity edema, dizziness, presyncope, or syncope.  The patient is otherwise without complaint today.   Past Medical History:  Diagnosis Date  . A-fib ([place])   . Anemia   . Anxiety   . Arthritis   . Chronic headaches   . Depression   . Fibromyalgia   . GERD (gastroesophageal reflux disease)   . History of echocardiogram    a. echo 02/2013: normal LV function with an EF of 55-60%, mild MR, mild TR, and PA peak pressure of 40 mmHg  . Hyperlipemia   . IBS (irritable bowel syndrome)   . MVP (mitral valve prolapse)   . Pneumonia   . PONV (postoperative nausea and vomiting)   . S/P cardiac pacemaker procedure, 02/16/13, Bio tronik device 02/19/2013  . Symptomatic bradycardia    s/p Biotronik Evia dual chamber pacemaker implant 02/16/2013 by Dr Rayann Heman  . UTI (lower urinary tract infection)   . Vertigo    Past Surgical History:  Procedure Laterality Date  . BLADDER SUSPENSION    . CESAREAN SECTION    . DILATION AND CURETTAGE OF UTERUS    . PACEMAKER INSERTION  02/16/2013   Biotronik Evia dual chamber pacemaker implanted by Dr Rayann Heman  . PERMANENT PACEMAKER INSERTION N/A 02/16/2013   Procedure: PERMANENT PACEMAKER INSERTION;  Surgeon: Thompson Grayer, MD;  Location: [facility] CATH LAB;  Service: Cardiovascular;  Laterality: N/A;  . TONSILLECTOMY AND ADENOIDECTOMY    . TOTAL ABDOMINAL HYSTERECTOMY    . TYMPANOPLASTY      ROS- all systems are reviewed and negative except as per HPI above  Current Outpatient Prescriptions  Medication Sig Dispense Refill   . ARIPiprazole (ABILIFY) 5 MG tablet Take 5 mg by mouth every morning.  0  . atorvastatin (LIPITOR) 20 MG tablet Take 1 tablet (20 mg total) by mouth daily at 6 PM. 90 tablet 3  . B Complex-C (B-COMPLEX WITH VITAMIN C) tablet Take 1 tablet by mouth daily.    Marland Kitchen buPROPion (WELLBUTRIN XL) 150 MG 24 hr tablet Take 150 mg by mouth every morning.  0  . calcium-vitamin D (CALCIUM 500+D) 500-200 MG-UNIT per tablet Take 1 tablet by mouth 2 (two) times daily.     . clotrimazole-betamethasone (LOTRISONE) cream Apply 1 application topically 2 (two) times daily. 45 g 1  . co-enzyme Q-10 50 MG capsule Take 50 mg by mouth 2 (two) times daily.    . Creatinine POWD Take by mouth as directed.     Marland Kitchen ELIQUIS 5 MG TABS tablet take 1 tablet by mouth twice a day 60 tablet 3  . FLUoxetine (PROZAC) 40 MG capsule Take 1 capsule (40 mg total) by mouth daily. 90 capsule 3  . HYDROcodone-acetaminophen (NORCO) 10-325 MG tablet Take 1 tablet by mouth every 12 (twelve) hours. 60 tablet 0  . Magnesium Oxide (MAG-200 PO) Take 1 tablet by mouth 2 (two) times daily.     Marland Kitchen OLANZapine (ZYPREXA) 5 MG tablet   0  . pantoprazole (PROTONIX) 40 MG tablet Take 1 tablet (40 mg  total) by mouth daily. 90 tablet 1   No current facility-administered medications for this visit.     Physical Exam: Vitals:   06/07/17 1506  BP: (!) 130/50  Pulse: 75  SpO2: 99%  Weight: 163 lb (73.9 kg)  Height: 5\' 10"  (1.778 m)    GEN- The patient is well appearing, alert and oriented x 3 today.   Head- normocephalic, atraumatic Eyes-  Sclera clear, conjunctiva pink Ears- hearing intact Oropharynx- clear Lungs- Clear to ausculation bilaterally, normal work of breathing Chest- pacemaker pocket is well healed Heart- Regular rate and rhythm, no murmurs, rubs or gallops, PMI not laterally displaced GI- soft, NT, ND, + BS Extremities- no clubbing, cyanosis, or edema MS- walks slowly with a cane  Pacemaker interrogation- reviewed in detail today,   See PACEART report   Assessment and Plan:  1. Symptomatic sinus bradycardia  Normal pacemaker function See Pace Art report No changes today  2. Atypical atrial flutter/ atrial fibrillation Asymptomatic, burden is 54%, though appears to be persistently in afib most of this year On eliquis for chads2vasc score of 3.  Remote monitoring Return to see me in a year  Thompson Grayer MD, [facility] 06/07/2017 3:18 PM

## 2017-06-07 NOTE — Patient Instructions (Signed)
Medication Instructions:  Your physician recommends that you continue on your current medications as directed. Please refer to the Current Medication list given to you today.  -- If you need a refill on your cardiac medications before your next appointment, please call your pharmacy. --  Labwork: None ordered  Testing/Procedures: None ordered  Follow-Up: Your physician wants you to follow-up in: 1 year with Dr. Rayann Heman.  You will receive a reminder letter in the mail two months in advance. If you don't receive a letter, please call our office to schedule the follow-up appointment.  Remote monitoring is used to monitor your Pacemakerfrom home. This monitoring reduces the number of office visits required to check your device to one time per year. It allows [place] to keep an eye on the functioning of your device to ensure it is working properly. You are scheduled for a device check from home on 08/18/2017. You may send your transmission at any time that day. If you have a wireless device, the transmission will be sent automatically. After your physician reviews your transmission, you will receive a postcard with your next transmission date.    Thank you for choosing [facility]!!   Frederik Schmidt, RN [phone]  Any Other Special Instructions Will Be Listed Below (If Applicable).

## 2017-06-08 LAB — CUP PACEART INCLINIC DEVICE CHECK
Battery Remaining Longevity: 77 mo
Date Time Interrogation Session: 20181030150553
Implantable Lead Implant Date: 20140710
Implantable Lead Location: 753859
Implantable Lead Model: 346
Implantable Lead Serial Number: 29334372
Implantable Pulse Generator Implant Date: 20140710
Lead Channel Impedance Value: 741 Ohm
Lead Channel Pacing Threshold Amplitude: 0.4 V
Lead Channel Sensing Intrinsic Amplitude: 21.1 mV
MDC IDC LEAD IMPLANT DT: 20140710
MDC IDC LEAD LOCATION: 753860
MDC IDC LEAD SERIAL: 29426855
MDC IDC MSMT LEADCHNL RA IMPEDANCE VALUE: 682 Ohm
MDC IDC MSMT LEADCHNL RA SENSING INTR AMPL: 2.9 mV
MDC IDC MSMT LEADCHNL RV PACING THRESHOLD PULSEWIDTH: 0.4 ms
MDC IDC PG SERIAL: 66385898
MDC IDC SET LEADCHNL RA PACING AMPLITUDE: 1.7 V
MDC IDC SET LEADCHNL RV PACING AMPLITUDE: 0.9 V
MDC IDC SET LEADCHNL RV PACING PULSEWIDTH: 0.4 ms
MDC IDC STAT BRADY RA PERCENT PACED: 29 %
MDC IDC STAT BRADY RV PERCENT PACED: 96 %

## 2017-06-17 ENCOUNTER — Telehealth: Payer: Self-pay

## 2017-06-17 NOTE — Telephone Encounter (Signed)
Pt called and asked for a copy of the letter sent to the [place] for her to have her mail delivered to her door. She stated that they told her today it was mailed to the wrong office and that she needed a copy of it along with a written letter by her to sent to the correct address. Looking in the chart I can not find the letter in her chart to print for her. Please advise

## 2017-06-17 NOTE — Telephone Encounter (Signed)
Sharyn Lull. Can you find a copy of this letter? If not, I do not see why post office cannot return it to her.

## 2017-06-18 NOTE — Telephone Encounter (Signed)
From my understanding they lost the letter once seen it was sent to the wrong office.

## 2017-06-22 NOTE — Telephone Encounter (Signed)
Called pt to find out if she wanted to come pick it up this week, no answer LMTCB

## 2017-06-22 NOTE — Telephone Encounter (Signed)
I have put a copy of the letter for your review on my desk.  Do we need to mail it to the patient?

## 2017-06-24 ENCOUNTER — Encounter: Payer: Self-pay | Admitting: Internal Medicine

## 2017-06-24 ENCOUNTER — Ambulatory Visit: Payer: PPO | Admitting: Internal Medicine

## 2017-06-24 VITALS — BP 108/80 | HR 62 | Temp 98.1°F | Wt 164.0 lb

## 2017-06-24 DIAGNOSIS — F419 Anxiety disorder, unspecified: Secondary | ICD-10-CM | POA: Diagnosis not present

## 2017-06-24 DIAGNOSIS — F329 Major depressive disorder, single episode, unspecified: Secondary | ICD-10-CM

## 2017-06-24 DIAGNOSIS — G713 Mitochondrial myopathy, not elsewhere classified: Secondary | ICD-10-CM

## 2017-06-24 DIAGNOSIS — F4321 Adjustment disorder with depressed mood: Secondary | ICD-10-CM | POA: Diagnosis not present

## 2017-06-24 DIAGNOSIS — Z950 Presence of cardiac pacemaker: Secondary | ICD-10-CM

## 2017-06-24 DIAGNOSIS — R531 Weakness: Secondary | ICD-10-CM

## 2017-06-24 DIAGNOSIS — F5102 Adjustment insomnia: Secondary | ICD-10-CM | POA: Diagnosis not present

## 2017-06-24 DIAGNOSIS — M797 Fibromyalgia: Secondary | ICD-10-CM | POA: Diagnosis not present

## 2017-06-24 DIAGNOSIS — M47816 Spondylosis without myelopathy or radiculopathy, lumbar region: Secondary | ICD-10-CM | POA: Diagnosis not present

## 2017-06-24 DIAGNOSIS — F32A Depression, unspecified: Secondary | ICD-10-CM

## 2017-06-24 DIAGNOSIS — F4320 Adjustment disorder, unspecified: Secondary | ICD-10-CM

## 2017-06-24 MED ORDER — ALPRAZOLAM 1 MG PO TABS: ORAL_TABLET | ORAL | 5 refills | Status: DC

## 2017-06-24 NOTE — Patient Instructions (Signed)
Continue same medications.  Can refill hydrocodone APAP late November.  Follow-up in late January with office visit lipid panel and liver functions which will be 62-month recheck.

## 2017-06-24 NOTE — Progress Notes (Signed)
   Subjective:    Patient ID: Wendy Rose, female    DOB: [birth year], 71 y.o.   MRN: [id number]  HPI  71 year old Female with mitochondrial myopathy, fibromyalgia, pacemaker, anxiety depression, grief reaction, insomnia for follow-up on chronic pain management.  She takes Norco 10/325 1/2-1 tablet twice daily.  Says in late November she will be going to [place] [employer].  She is looking forward to the trip.  She will need refill on her medication around November 27.  She takes walker with her for ambulation.  She has not fallen since last visit.  She did fall in September and was in the emergency department.  She recently asked for another letter to go to [place] postal services regarding having her mail delivered directly to her door.  Apparently letter I wrote previously went to the wrong individual with a Postal Service.  We gave her another letter and she was told she also has to write a letter.  She is not too excited about the holidays but looking forward to spending time with family.  Says her anniversary of husband's birthday will be in December as well.  Her affect is brighter.  Asking for refill on Xanax.  This was refilled for 6 months today.  She is on Abilify.  Has gained some weight.  Has gained 8 pounds since July.  Very well may be due to Abilify.    Review of Systems  Respiratory: Negative.   Cardiovascular: Negative.   Gastrointestinal: Negative.   Neurological:       Generalized weakness persists   see above     Objective:   Physical Exam Spent 25 minutes speaking with her about these issues.  Her affect is much brighter and I am encouraged by that.  Her pain from fibromyalgia and multilevel spondylosis as documented by CT of the LS spine May 2017 is well managed by small amount of hydrocodone APAP and she uses it judiciously.       Assessment & Plan:  Mitochondrial myopathy  Fibromyalgia syndrome  Spondylosis of the LS spine  Plan: She will need pain management follow-up in  60-month recheck late January 2019.  At that time she will need fasting lipid panel, and liver functions.  She is on chronic Eliquis therapy with her cardiologist.  Needs to be careful with balance to avoid falls.

## 2017-07-07 ENCOUNTER — Other Ambulatory Visit: Payer: Self-pay

## 2017-07-07 MED ORDER — HYDROCODONE-ACETAMINOPHEN 10-325 MG PO TABS: 1.0000 | ORAL_TABLET | Freq: Two times a day (BID) | ORAL | 0 refills | Status: DC

## 2017-08-18 ENCOUNTER — Ambulatory Visit (INDEPENDENT_AMBULATORY_CARE_PROVIDER_SITE_OTHER): Payer: PPO | Admitting: *Deleted

## 2017-08-18 DIAGNOSIS — I495 Sick sinus syndrome: Secondary | ICD-10-CM | POA: Diagnosis not present

## 2017-08-18 NOTE — Progress Notes (Signed)
Remote pacemaker transmission.   

## 2017-08-19 ENCOUNTER — Encounter: Payer: Self-pay | Admitting: Cardiology

## 2017-08-23 DIAGNOSIS — H25011 Cortical age-related cataract, right eye: Secondary | ICD-10-CM | POA: Diagnosis not present

## 2017-08-23 DIAGNOSIS — H35033 Hypertensive retinopathy, bilateral: Secondary | ICD-10-CM | POA: Diagnosis not present

## 2017-08-23 DIAGNOSIS — H35379 Puckering of macula, unspecified eye: Secondary | ICD-10-CM | POA: Diagnosis not present

## 2017-08-23 DIAGNOSIS — H2511 Age-related nuclear cataract, right eye: Secondary | ICD-10-CM | POA: Diagnosis not present

## 2017-08-25 ENCOUNTER — Telehealth: Payer: Self-pay | Admitting: Internal Medicine

## 2017-08-25 MED ORDER — HYDROCODONE-ACETAMINOPHEN 10-325 MG PO TABS: 1.0000 | ORAL_TABLET | Freq: Two times a day (BID) | ORAL | 0 refills | Status: DC

## 2017-08-25 NOTE — Telephone Encounter (Signed)
Refill once- pick up tomorrow.

## 2017-08-25 NOTE — Telephone Encounter (Signed)
Patient is calling to request a refill on her Norco 10-325.   I show it was last filled on 07/07/17.  She is not due for her 3 month follow up for pain management until February.    Thank you.    Please call her when ready to pick up.

## 2017-08-25 NOTE — Telephone Encounter (Signed)
Printed, left message to pick up tomorrow.

## 2017-08-27 DIAGNOSIS — F39 Unspecified mood [affective] disorder: Secondary | ICD-10-CM | POA: Diagnosis not present

## 2017-08-30 LAB — CUP PACEART REMOTE DEVICE CHECK
Brady Statistic AP VS Percent: 0 %
Brady Statistic AS VP Percent: 29 %
Brady Statistic AS VS Percent: 0 %
Brady Statistic RV Percent Paced: 78 %
Date Time Interrogation Session: 20190121052935
Implantable Lead Implant Date: 20140710
Implantable Lead Implant Date: 20140710
Implantable Lead Location: 753859
Implantable Lead Location: 753860
Lead Channel Impedance Value: 707 Ohm
Lead Channel Setting Pacing Pulse Width: 0.4 ms
MDC IDC LEAD SERIAL: 29334372
MDC IDC LEAD SERIAL: 29426855
MDC IDC MSMT BATTERY REMAINING PERCENTAGE: 65 %
MDC IDC MSMT LEADCHNL RV IMPEDANCE VALUE: 752 Ohm
MDC IDC PG IMPLANT DT: 20140710
MDC IDC STAT BRADY AP VP PERCENT: 18 %
MDC IDC STAT BRADY RA PERCENT PACED: 2 %
Pulse Gen Serial Number: 66385898

## 2017-08-31 DIAGNOSIS — H25811 Combined forms of age-related cataract, right eye: Secondary | ICD-10-CM | POA: Diagnosis not present

## 2017-08-31 DIAGNOSIS — H2511 Age-related nuclear cataract, right eye: Secondary | ICD-10-CM | POA: Diagnosis not present

## 2017-09-06 ENCOUNTER — Other Ambulatory Visit: Payer: Self-pay | Admitting: Cardiovascular Disease

## 2017-09-06 NOTE — Telephone Encounter (Signed)
Rx(s) sent to pharmacy electronically.  

## 2017-10-05 DIAGNOSIS — F39 Unspecified mood [affective] disorder: Secondary | ICD-10-CM | POA: Diagnosis not present

## 2017-10-07 ENCOUNTER — Telehealth: Payer: Self-pay | Admitting: Internal Medicine

## 2017-10-07 NOTE — Telephone Encounter (Signed)
Needs OV.  

## 2017-10-07 NOTE — Telephone Encounter (Signed)
Calling to request refill on Norco 10-325mg .  Last filled on 08/26/17.    Phone:  [phone]  Thank you.

## 2017-10-07 NOTE — Telephone Encounter (Signed)
SCHEDULED

## 2017-10-08 ENCOUNTER — Ambulatory Visit (INDEPENDENT_AMBULATORY_CARE_PROVIDER_SITE_OTHER): Payer: PPO | Admitting: Internal Medicine

## 2017-10-08 ENCOUNTER — Encounter: Payer: Self-pay | Admitting: Internal Medicine

## 2017-10-08 VITALS — BP 140/78 | HR 64 | Temp 98.3°F | Ht 70.0 in | Wt 174.0 lb

## 2017-10-08 DIAGNOSIS — F5102 Adjustment insomnia: Secondary | ICD-10-CM

## 2017-10-08 DIAGNOSIS — R0982 Postnasal drip: Secondary | ICD-10-CM | POA: Diagnosis not present

## 2017-10-08 DIAGNOSIS — G713 Mitochondrial myopathy, not elsewhere classified: Secondary | ICD-10-CM | POA: Diagnosis not present

## 2017-10-08 DIAGNOSIS — M797 Fibromyalgia: Secondary | ICD-10-CM | POA: Diagnosis not present

## 2017-10-08 DIAGNOSIS — Z950 Presence of cardiac pacemaker: Secondary | ICD-10-CM

## 2017-10-08 DIAGNOSIS — F4321 Adjustment disorder with depressed mood: Secondary | ICD-10-CM

## 2017-10-08 DIAGNOSIS — G8929 Other chronic pain: Secondary | ICD-10-CM | POA: Diagnosis not present

## 2017-10-08 MED ORDER — METHYLPREDNISOLONE ACETATE 80 MG/ML IJ SUSP
80.0000 mg | Freq: Once | INTRAMUSCULAR | Status: AC
  Administered 2017-10-08: 80 mg via INTRAMUSCULAR

## 2017-10-08 MED ORDER — HYDROCODONE-ACETAMINOPHEN 10-325 MG PO TABS: 1.0000 | ORAL_TABLET | Freq: Two times a day (BID) | ORAL | 0 refills | Status: DC

## 2017-10-08 NOTE — Progress Notes (Signed)
   Subjective:    Patient ID: Wendy Rose, female    DOB: [birth year], 72 y.o.   MRN: [id number]  HPI 72 year old Female in today for follow-up of chronic pain management.  History of mitochondrial myopathy.  Has chronic musculoskeletal pain and history of fibromyalgia syndrome.  She uses her pain medication appropriately.  She is adjusting to living alone.  Husband died a while back.  It has been a bit depressing for her but she is doing pretty well.  Being seen at [facility].  Continues to have issues with insomnia.    Review of Systems see above     Objective:   Physical Exam Chest clear to auscultation.  Cardiac exam regular rate and rhythm.  Extremities without edema.  Spent 20 minutes speaking with her about pain management and issues in her life.       Assessment & Plan:  Chronic  pain management-takes medications appropriately  Mitochondrial myopathy-patient followed at [facility] by Dr. Doy Mince  Anxiety depression being seen at [facility]  Insomnia-treated at [facility] over loss of husband-seems better  History of fibromyalgia syndrome with chronic pain  Plan: Return in late July for physical examination.  Refill Norco 10/325 #60 one p.o. every 12 hours for pain.

## 2017-10-14 ENCOUNTER — Other Ambulatory Visit: Payer: Self-pay | Admitting: Cardiovascular Disease

## 2017-10-14 ENCOUNTER — Other Ambulatory Visit: Payer: Self-pay

## 2017-10-14 MED ORDER — PANTOPRAZOLE SODIUM 40 MG PO TBEC: 40.0000 mg | DELAYED_RELEASE_TABLET | Freq: Every day | ORAL | 3 refills | Status: DC

## 2017-10-14 NOTE — Telephone Encounter (Signed)
REFILL 

## 2017-10-30 NOTE — Patient Instructions (Signed)
Continue current medications.  Norco 10/325 refilled #60.  Needs follow-up in late July with physical exam and encounter for pain management.

## 2017-11-11 ENCOUNTER — Encounter: Payer: Self-pay | Admitting: Internal Medicine

## 2017-11-11 ENCOUNTER — Ambulatory Visit (INDEPENDENT_AMBULATORY_CARE_PROVIDER_SITE_OTHER): Payer: PPO | Admitting: Internal Medicine

## 2017-11-11 VITALS — BP 120/60 | HR 77 | Temp 98.5°F | Ht 70.0 in | Wt 174.0 lb

## 2017-11-11 DIAGNOSIS — J069 Acute upper respiratory infection, unspecified: Secondary | ICD-10-CM | POA: Diagnosis not present

## 2017-11-11 DIAGNOSIS — G713 Mitochondrial myopathy, not elsewhere classified: Secondary | ICD-10-CM

## 2017-11-11 DIAGNOSIS — M797 Fibromyalgia: Secondary | ICD-10-CM

## 2017-11-11 DIAGNOSIS — H6692 Otitis media, unspecified, left ear: Secondary | ICD-10-CM | POA: Diagnosis not present

## 2017-11-11 MED ORDER — AMOXICILLIN 500 MG PO CAPS: 500.0000 mg | ORAL_CAPSULE | Freq: Three times a day (TID) | ORAL | 0 refills | Status: DC

## 2017-11-11 MED ORDER — BENZONATATE 100 MG PO CAPS: 100.0000 mg | ORAL_CAPSULE | Freq: Three times a day (TID) | ORAL | 0 refills | Status: DC | PRN

## 2017-11-11 MED ORDER — HYDROCODONE-ACETAMINOPHEN 10-325 MG PO TABS: 1.0000 | ORAL_TABLET | Freq: Two times a day (BID) | ORAL | 0 refills | Status: DC

## 2017-11-11 NOTE — Patient Instructions (Signed)
Amoxicillin 500 mg 3 times a day for 10 days.  Refill Norco 10/325 for chronic pain.  For cough Tessalon Perles 100 mg 3 times a day as needed for cough.  Rest and drink plenty of fluids.

## 2017-11-11 NOTE — Progress Notes (Signed)
   Subjective:    Patient ID: Wendy Rose, female    DOB: 10/29/1945, 72 y.o.   MRN: [id number]  HPI Patient in today with respiratory infection symptoms.  She has fibromyalgia and mitochondrial myopathy.  Has chronic pain treated with Norco 10/325 every 12 hours.  She takes this sparingly and  according to directions.  She is asking for refill today.  Last refill received was October 08, 2017.  Has developed respiratory infection symptoms with cough sore throat and left ear pain.  Cough has been keeping her up at night.  She does not want narcotic cough medication.  Has malaise and fatigue but no documented fever or shaking chills.    Review of Systems     Objective:   Physical Exam Temperature is 98.5 degrees orally blood pressure 120/60 pulse 77.  Skin is warm and dry.  Her left TM is red and dull.  Right TM is dull but not red.  Pharynx is slightly injected without exudate.  Neck is supple.  Chest clear.  No rales or wheezing appreciated.  Cardiac exam regular rate and rhythm.  Extremities without edema.  She is alert and appropriate.       Assessment & Plan:  Acute left otitis media  Acute URI  Mitochondrial myopathy  Fibromyalgia syndrome  Chronic pain management  Plan: Okay to refill Norco 10/325 for 30 days which she takes 1 p.o. every 12 hours.  Amoxicillin 500 mg 3 times a day for 10 days.  Tessalon Perles 100 mg up to 3 times a day as needed for cough.  Rest and drink plenty of fluids.

## 2017-11-17 ENCOUNTER — Ambulatory Visit (INDEPENDENT_AMBULATORY_CARE_PROVIDER_SITE_OTHER): Payer: PPO | Admitting: *Deleted

## 2017-11-17 DIAGNOSIS — I495 Sick sinus syndrome: Secondary | ICD-10-CM

## 2017-11-17 NOTE — Progress Notes (Signed)
Remote pacemaker transmission.   

## 2017-11-18 ENCOUNTER — Encounter: Payer: Self-pay | Admitting: Cardiology

## 2017-11-18 DIAGNOSIS — G713 Mitochondrial myopathy, not elsewhere classified: Secondary | ICD-10-CM | POA: Diagnosis not present

## 2017-11-18 DIAGNOSIS — G609 Hereditary and idiopathic neuropathy, unspecified: Secondary | ICD-10-CM | POA: Diagnosis not present

## 2017-11-19 ENCOUNTER — Telehealth: Payer: Self-pay

## 2017-11-19 ENCOUNTER — Other Ambulatory Visit: Payer: Self-pay | Admitting: Internal Medicine

## 2017-11-19 DIAGNOSIS — R0602 Shortness of breath: Secondary | ICD-10-CM

## 2017-11-19 NOTE — Telephone Encounter (Signed)
Called patient to let her know that she needs to go see a pulmonologist and she said that she sees Dr. Tarri Fuller and she will call them to schedule.

## 2017-12-07 ENCOUNTER — Encounter: Payer: Self-pay | Admitting: Internal Medicine

## 2017-12-07 ENCOUNTER — Ambulatory Visit (INDEPENDENT_AMBULATORY_CARE_PROVIDER_SITE_OTHER): Payer: PPO | Admitting: Internal Medicine

## 2017-12-07 VITALS — BP 130/80 | HR 95 | Temp 98.3°F | Wt 175.0 lb

## 2017-12-07 DIAGNOSIS — G713 Mitochondrial myopathy, not elsewhere classified: Secondary | ICD-10-CM

## 2017-12-07 DIAGNOSIS — R350 Frequency of micturition: Secondary | ICD-10-CM

## 2017-12-07 DIAGNOSIS — R8290 Unspecified abnormal findings in urine: Secondary | ICD-10-CM | POA: Diagnosis not present

## 2017-12-07 DIAGNOSIS — R300 Dysuria: Secondary | ICD-10-CM | POA: Diagnosis not present

## 2017-12-07 DIAGNOSIS — N390 Urinary tract infection, site not specified: Secondary | ICD-10-CM | POA: Diagnosis not present

## 2017-12-07 DIAGNOSIS — M797 Fibromyalgia: Secondary | ICD-10-CM | POA: Diagnosis not present

## 2017-12-07 DIAGNOSIS — M7918 Myalgia, other site: Secondary | ICD-10-CM

## 2017-12-07 LAB — POCT URINALYSIS DIPSTICK
Bilirubin, UA: NEGATIVE
Glucose, UA: NEGATIVE
Ketones, UA: NEGATIVE
NITRITE UA: NEGATIVE
Spec Grav, UA: 1.015 (ref 1.010–1.025)
Urobilinogen, UA: 0.2 E.U./dL
pH, UA: 6.5 (ref 5.0–8.0)

## 2017-12-07 LAB — CBC WITH DIFFERENTIAL/PLATELET
BASOS PCT: 0.3 %
Basophils Absolute: 27 cells/uL (ref 0–200)
Eosinophils Absolute: 71 cells/uL (ref 15–500)
Eosinophils Relative: 0.8 %
HCT: 37.5 % (ref 35.0–45.0)
Hemoglobin: 12.7 g/dL (ref 11.7–15.5)
Lymphs Abs: 1655 cells/uL (ref 850–3900)
MCH: 28.1 pg (ref 27.0–33.0)
MCHC: 33.9 g/dL (ref 32.0–36.0)
MCV: 83 fL (ref 80.0–100.0)
MONOS PCT: 14.5 %
MPV: 10.9 fL (ref 7.5–12.5)
Neutro Abs: 5856 cells/uL (ref 1500–7800)
Neutrophils Relative %: 65.8 %
PLATELETS: 208 10*3/uL (ref 140–400)
RBC: 4.52 10*6/uL (ref 3.80–5.10)
RDW: 12.8 % (ref 11.0–15.0)
TOTAL LYMPHOCYTE: 18.6 %
WBC mixed population: 1291 cells/uL — ABNORMAL HIGH (ref 200–950)
WBC: 8.9 10*3/uL (ref 3.8–10.8)

## 2017-12-07 MED ORDER — AMOXICILLIN-POT CLAVULANATE 500-125 MG PO TABS: 1.0000 | ORAL_TABLET | Freq: Three times a day (TID) | ORAL | 0 refills | Status: DC

## 2017-12-07 MED ORDER — HYDROCODONE-ACETAMINOPHEN 10-325 MG PO TABS: 1.0000 | ORAL_TABLET | Freq: Two times a day (BID) | ORAL | 0 refills | Status: DC

## 2017-12-07 NOTE — Addendum Note (Signed)
Addended by: Mady Haagensen on: 12/07/2017 04:13 PM   Modules accepted: Orders

## 2017-12-07 NOTE — Addendum Note (Signed)
Addended by: Mady Haagensen on: 12/07/2017 03:44 PM   Modules accepted: Orders

## 2017-12-07 NOTE — Progress Notes (Addendum)
   Subjective:    Patient ID: Wendy Rose, female    DOB: 1946-01-18, 72 y.o.   MRN: [id number]  HPI  Onset of UTI symptoms late last week with urge urinary incontinence progressing to dysuria.  No fever or shaking chills.  No nausea or vomiting.  Complaining bitterly of musculoskeletal pain.  Is difficult to sort out how much of this is fibromyalgia syndrome versus being ill with a urinary tract infection.  CBC with differential drawn today.  Has frequency and dysuria.    Review of Systems for chronic musculoskeletal pain with history of fibromyalgia syndrome and mitochondrial myopathy     Objective:   Physical Exam No CVA tenderness.  Urine dipstick shows 2+ LE, protein and large occult blood.       Assessment & Plan:  Acute UTI  Plan: She has a number of drug intolerances.  This includes Sulfa, Macrobid and Cipro  ss well as cefuroxime.  She will be treated with Augmentin 500 mg 3 times a day for 10 days.  CBC with differential pending.  Addendum: After the office visit was completed, she asked front office staff for refill on hydrocodone APAP.  Says she is going out of town this coming weekend.  Wants it dated for May 5.  This was provided for her.  She needs to have her routine health maintenance exam and pain management visit in July.  This will be booked today.  She will need a drug screen at that time.

## 2017-12-07 NOTE — Patient Instructions (Addendum)
Augmentin 500 mg 3 times a day with food for 10 days.  Culture pending.  Hydrocodone APAP refilled from May 5.  Book physical exam in July

## 2017-12-10 ENCOUNTER — Other Ambulatory Visit: Payer: Self-pay | Admitting: Internal Medicine

## 2017-12-10 LAB — URINE CULTURE
MICRO NUMBER: 90525090
SPECIMEN QUALITY: ADEQUATE

## 2017-12-24 LAB — CUP PACEART REMOTE DEVICE CHECK
Date Time Interrogation Session: 20190517150010
Implantable Lead Implant Date: 20140710
Implantable Lead Implant Date: 20140710
Implantable Lead Model: 346
Implantable Lead Serial Number: 29334372
MDC IDC LEAD LOCATION: 753859
MDC IDC LEAD LOCATION: 753860
MDC IDC LEAD SERIAL: 29426855
MDC IDC PG IMPLANT DT: 20140710
MDC IDC PG SERIAL: 66385898

## 2018-02-02 ENCOUNTER — Other Ambulatory Visit: Payer: Self-pay | Admitting: Internal Medicine

## 2018-02-03 NOTE — Telephone Encounter (Signed)
Pt last saw Dr Rayann Heman 06/07/17, last labs 05/06/17 Creat 0.63, age 72, weight 79.4kg, based on specified criteria pt is on correct dosage of Eliquis 5mg  BID.  Will refill rx.

## 2018-02-09 ENCOUNTER — Ambulatory Visit (INDEPENDENT_AMBULATORY_CARE_PROVIDER_SITE_OTHER): Payer: PPO | Admitting: Ophthalmology

## 2018-02-09 DIAGNOSIS — H43813 Vitreous degeneration, bilateral: Secondary | ICD-10-CM | POA: Diagnosis not present

## 2018-02-09 DIAGNOSIS — H35373 Puckering of macula, bilateral: Secondary | ICD-10-CM

## 2018-02-15 ENCOUNTER — Telehealth: Payer: Self-pay | Admitting: Internal Medicine

## 2018-02-15 DIAGNOSIS — F39 Unspecified mood [affective] disorder: Secondary | ICD-10-CM | POA: Diagnosis not present

## 2018-02-15 MED ORDER — HYDROCODONE-ACETAMINOPHEN 10-325 MG PO TABS: 1.0000 | ORAL_TABLET | Freq: Two times a day (BID) | ORAL | 0 refills | Status: DC

## 2018-02-15 NOTE — Telephone Encounter (Signed)
Needs to get a refill on her Norco 10-325 mg.  Last filled 12/12/17.    Pharmacy:  [facility].  Phone:  [phone]  Thank you.

## 2018-02-16 ENCOUNTER — Ambulatory Visit (INDEPENDENT_AMBULATORY_CARE_PROVIDER_SITE_OTHER): Payer: PPO | Admitting: *Deleted

## 2018-02-16 DIAGNOSIS — I442 Atrioventricular block, complete: Secondary | ICD-10-CM | POA: Diagnosis not present

## 2018-02-16 NOTE — Progress Notes (Signed)
Remote pacemaker transmission.   

## 2018-02-18 ENCOUNTER — Telehealth: Payer: Self-pay | Admitting: *Deleted

## 2018-02-18 NOTE — Telephone Encounter (Signed)
Alert received for disabled RV ATM or capture control. Spoke to Geraldine, BTK who recommended checking thresholds, fixing outputs, and changing capture control to ATM in RA/RV.  Called patient to inform her about the alert and recommendations to come in for a device check. Patient states that she can't come into the office today, but asked for an appointment on Tuesday pm. Appt scheduled for 02/22/18 '@1200' . Gerald Stabs, BTK aware.

## 2018-02-22 ENCOUNTER — Ambulatory Visit (INDEPENDENT_AMBULATORY_CARE_PROVIDER_SITE_OTHER): Payer: PPO | Admitting: *Deleted

## 2018-02-22 DIAGNOSIS — I495 Sick sinus syndrome: Secondary | ICD-10-CM

## 2018-02-22 LAB — CUP PACEART REMOTE DEVICE CHECK
Date Time Interrogation Session: 20190716130528
Implantable Lead Implant Date: 20140710
Implantable Lead Model: 346
Implantable Lead Serial Number: 29334372
MDC IDC LEAD IMPLANT DT: 20140710
MDC IDC LEAD LOCATION: 753859
MDC IDC LEAD LOCATION: 753860
MDC IDC LEAD SERIAL: 29426855
MDC IDC PG IMPLANT DT: 20140710
MDC IDC PG SERIAL: 66385898

## 2018-02-22 LAB — CUP PACEART INCLINIC DEVICE CHECK
Implantable Lead Implant Date: 20140710
Implantable Lead Implant Date: 20140710
Implantable Lead Location: 753860
Implantable Lead Model: 346
Implantable Lead Model: 346
Implantable Lead Serial Number: 29334372
MDC IDC LEAD LOCATION: 753859
MDC IDC LEAD SERIAL: 29426855
MDC IDC PG IMPLANT DT: 20140710
MDC IDC PG SERIAL: 66385898
MDC IDC SESS DTM: 20190716123750

## 2018-02-22 NOTE — Progress Notes (Signed)
Pacemaker check in clinic d/t vent. capture control unable to determine threshold. Manual RV threshold performed with LOC @ 0.55mV@0 .41ms. RV output programmed with monitor only for RV threshold, and output set @2 .4V@.4ms. Known AF + Eliquis. ROV w/ JA 05/2018

## 2018-02-24 ENCOUNTER — Other Ambulatory Visit: Payer: Self-pay | Admitting: Internal Medicine

## 2018-02-24 DIAGNOSIS — K219 Gastro-esophageal reflux disease without esophagitis: Secondary | ICD-10-CM

## 2018-02-24 DIAGNOSIS — F32A Depression, unspecified: Secondary | ICD-10-CM

## 2018-02-24 DIAGNOSIS — R531 Weakness: Secondary | ICD-10-CM

## 2018-02-24 DIAGNOSIS — F439 Reaction to severe stress, unspecified: Secondary | ICD-10-CM

## 2018-02-24 DIAGNOSIS — Z13220 Encounter for screening for lipoid disorders: Secondary | ICD-10-CM

## 2018-02-24 DIAGNOSIS — M8580 Other specified disorders of bone density and structure, unspecified site: Secondary | ICD-10-CM

## 2018-02-24 DIAGNOSIS — M545 Low back pain, unspecified: Secondary | ICD-10-CM

## 2018-02-24 DIAGNOSIS — H903 Sensorineural hearing loss, bilateral: Secondary | ICD-10-CM

## 2018-02-24 DIAGNOSIS — F329 Major depressive disorder, single episode, unspecified: Secondary | ICD-10-CM

## 2018-02-24 DIAGNOSIS — G713 Mitochondrial myopathy, not elsewhere classified: Secondary | ICD-10-CM

## 2018-02-24 DIAGNOSIS — Z950 Presence of cardiac pacemaker: Secondary | ICD-10-CM

## 2018-02-24 DIAGNOSIS — M797 Fibromyalgia: Secondary | ICD-10-CM

## 2018-02-24 DIAGNOSIS — Z1329 Encounter for screening for other suspected endocrine disorder: Secondary | ICD-10-CM

## 2018-02-24 DIAGNOSIS — Z0000 Encounter for general adult medical examination without abnormal findings: Secondary | ICD-10-CM

## 2018-02-24 DIAGNOSIS — F419 Anxiety disorder, unspecified: Secondary | ICD-10-CM

## 2018-02-28 ENCOUNTER — Telehealth: Payer: Self-pay | Admitting: Cardiovascular Disease

## 2018-02-28 ENCOUNTER — Ambulatory Visit: Payer: PPO | Admitting: Internal Medicine

## 2018-02-28 ENCOUNTER — Encounter: Payer: Self-pay | Admitting: Internal Medicine

## 2018-02-28 VITALS — BP 128/76 | HR 76 | Ht 70.0 in | Wt 180.4 lb

## 2018-02-28 DIAGNOSIS — R0609 Other forms of dyspnea: Secondary | ICD-10-CM

## 2018-02-28 NOTE — Patient Instructions (Signed)
Order-   Walk test on room air    Dx dyspnea on exertion  Order-    ONOX on room air     Dx DOE  Order-    Schedule PFT  Please call as needed

## 2018-02-28 NOTE — Telephone Encounter (Signed)
New Message:      1. What dental office are you calling from? [facility]  2. What is your office phone number? [phone]  3. What is your fax number? [phone]  4. What type of procedure is the patient having performed? Extraction of 1 tooth   5. What date is procedure scheduled or is the patient there now? TBD: She states needs to be soon as possible  6. What is your question (ex. Antibiotics prior to procedure, holding medication-we need to know how long dentist wants pt to hold med)? ELIQUIS 5 MG TABS tablet

## 2018-02-28 NOTE — Progress Notes (Signed)
HPI F never smoker followed for SOB and cough, complicated by A. Fib/ pacemaker, , headaches, depression, fibromyalgia, GERD, IBS, mitochondrial myopathy Husband Mikki Santee was patient here with severe asthma/COPD, expired Walk Test on room air 02/28/2018-maximum heart rate 115, lowest saturation 96% ---------------------------------------------------------------------  06/13/15- 69 yoFnever smoker pt. ref. by dr. Renold Genta. pt. states she's having occ. SOB. denies. wheezing,  chest pain/tightness. persistant  dry cough sometimes prod. clear in color. I have known her because she comes with her husband, followed here for chronic respiratory complaints complicated by GERD and anxiety. She reports he is concerned that her breathing has been labored and that she coughs especially at night. She has been sleeping in a recliner chair. Sometimes as she gets up in the morning she coughs up some clear mucus, otherwise dry. Known history of seasonal allergic rhinitis with postnasal drip but no asthma. Significant GERD despite daily PPI which she was not taking before meals. Cough has not been suppressed by daily use of tramadol and occasional use of hydrocodone. Medical history includes mitochondrial myopathy followed at [facility] and associated with weakness. She admits significant stress associated with husband. CXR 01/16/15 FINDINGS: Left-sided pacemaker overlies normal cardiac silhouette. No effusion, infiltrate, or pneumothorax. No acute osseous abnormality. IMPRESSION: No acute cardiopulmonary process. Electronically Signed  By: Suzy Bouchard M.D.  On: 01/16/2015 21:  02/28/2018- 72 yoFnever smoker followed for SOB and cough, complicated by A. Fib/ pacemaker, , headaches, depression, fibromyalgia, GERD, IBS, mitochondrial myopathy ----A Neurologist from [place] has asked her to come here and follow up. She states she always has mucus in her throat. SOB with activity. She describes dyspnea only with exertion.   Occasionally cough, mostly dry with no wheeze.  This seems stable to her.  Lives in a [place] home.  Because of her myopathy she has significant difficulty with stairs and carrying groceries.  Legs are weak.  Not aware of heart problems. Chokes easily with food and drink-chronic and stable.  She says this is "family" characteristic.  No recent pneumonia. Walk Test on room air 02/28/2018-maximum heart rate 115, lowest saturation 96% Up-to-date on pneumonia vaccines  ROS-see HPI   + = positive Constitutional:    weight loss, night sweats, fevers, chills, fatigue, lassitude. HEENT:    headaches, difficulty swallowing, tooth/dental problems, sore throat,       sneezing, itching, ear ache, nasal congestion, + post nasal drip, snoring CV:    chest pain, orthopnea, PND, swelling in lower extremities, anasarca,                                                  dizziness, palpitations Resp:   +shortness of breath with exertion or at rest.                productive cough,  + non-productive cough, coughing up of blood.              change in color of mucus.  wheezing.   Skin:    rash or lesions. GI:  + heartburn, indigestion, abdominal pain, nausea, vomiting, diarrhea,                 change in bowel habits, loss of appetite GU: dysuria, change in color of urine, no urgency or frequency.   flank pain. MS:   joint pain, stiffness, decreased range of motion, back pain.  Neuro-     nothing unusual Psych:  change in mood or affect.  depression or anxiety.   memory loss.  OBJ- Physical Exam General- Alert, Oriented, Affect-appropriate, Distress- none acute Skin- rash-none, lesions- none, excoriation- none Lymphadenopathy- none Head- atraumatic            Eyes- Gross vision intact, PERRLA, conjunctivae and secretions clear            Ears- Hearing, canals-normal            Nose- Clear, no-Septal dev, mucus, polyps, erosion, perforation             Throat- Mallampati II , mucosa clear , drainage- none,  tonsils- atrophic Neck- flexible , trachea midline, no stridor , thyroid nl, carotid no bruit Chest - symmetrical excursion , unlabored           Heart/CV- RRR , no murmur , no gallop  , no rub, nl s1 s2                           - JVD- none , edema- none, stasis changes- none, varices- none           Lung- clear to P&A, wheeze- none, cough- none at this visit , dullness-none, rub- none           Chest wall-+ pacemaker left Abd-  Br/ Gen/ Rectal- Not done, not indicated Extrem- cyanosis- none, clubbing, none, atrophy- none, strength + cane Neuro- grossly intact to observation

## 2018-03-01 NOTE — Assessment & Plan Note (Signed)
She did not desaturate with walking test here on flat surface but by the end of her walk was having trouble standing up straight.  Exercise limitation seems to be muscle weakness rather than hypoxia. Plan-overnight oximetry on room air, schedule PFT

## 2018-03-02 NOTE — Telephone Encounter (Signed)
Recommend continuing Eliquis for single dental extraction.

## 2018-03-02 NOTE — Telephone Encounter (Signed)
Patient may proceed with dental extraction. No cardiac testing is needed for this low risk procedure. It is recommended that the patient should remain on anticoagulation with Eliquis without interruption.  This phone note will be faxed to the surgeon's office.  Call back staff: Please contact surgeon's office to ensure this note was received. This note will be removed from preop pool.  Richardson Dopp, PA-C    03/02/2018 5:11 PM

## 2018-03-02 NOTE — Telephone Encounter (Signed)
Clearance forwarded to pharmacy team to address the Eliquis.  Rosaria Ferries, PA-C 03/02/2018 3:06 PM Beeper [phone]

## 2018-03-02 NOTE — Telephone Encounter (Signed)
Clearance faxed manually as it is after 5 and the Dental office is closed. I will remove from call back pool.

## 2018-03-03 ENCOUNTER — Encounter: Payer: Self-pay | Admitting: Internal Medicine

## 2018-03-04 ENCOUNTER — Other Ambulatory Visit: Payer: PPO | Admitting: Internal Medicine

## 2018-03-04 ENCOUNTER — Telehealth: Payer: Self-pay | Admitting: Internal Medicine

## 2018-03-04 DIAGNOSIS — R531 Weakness: Secondary | ICD-10-CM

## 2018-03-04 DIAGNOSIS — G713 Mitochondrial myopathy, not elsewhere classified: Secondary | ICD-10-CM

## 2018-03-04 DIAGNOSIS — M797 Fibromyalgia: Secondary | ICD-10-CM | POA: Diagnosis not present

## 2018-03-04 DIAGNOSIS — Z1329 Encounter for screening for other suspected endocrine disorder: Secondary | ICD-10-CM

## 2018-03-04 DIAGNOSIS — F439 Reaction to severe stress, unspecified: Secondary | ICD-10-CM | POA: Diagnosis not present

## 2018-03-04 DIAGNOSIS — F419 Anxiety disorder, unspecified: Secondary | ICD-10-CM

## 2018-03-04 DIAGNOSIS — H903 Sensorineural hearing loss, bilateral: Secondary | ICD-10-CM

## 2018-03-04 DIAGNOSIS — F32A Depression, unspecified: Secondary | ICD-10-CM

## 2018-03-04 DIAGNOSIS — F329 Major depressive disorder, single episode, unspecified: Secondary | ICD-10-CM | POA: Diagnosis not present

## 2018-03-04 DIAGNOSIS — K219 Gastro-esophageal reflux disease without esophagitis: Secondary | ICD-10-CM

## 2018-03-04 DIAGNOSIS — Z950 Presence of cardiac pacemaker: Secondary | ICD-10-CM

## 2018-03-04 DIAGNOSIS — M545 Low back pain, unspecified: Secondary | ICD-10-CM

## 2018-03-04 DIAGNOSIS — Z0000 Encounter for general adult medical examination without abnormal findings: Secondary | ICD-10-CM

## 2018-03-04 DIAGNOSIS — M8580 Other specified disorders of bone density and structure, unspecified site: Secondary | ICD-10-CM

## 2018-03-04 DIAGNOSIS — Z13220 Encounter for screening for lipoid disorders: Secondary | ICD-10-CM

## 2018-03-04 LAB — CBC WITH DIFFERENTIAL/PLATELET
BASOS ABS: 52 {cells}/uL (ref 0–200)
Basophils Relative: 0.8 %
EOS ABS: 143 {cells}/uL (ref 15–500)
Eosinophils Relative: 2.2 %
HCT: 38.4 % (ref 35.0–45.0)
Hemoglobin: 12.9 g/dL (ref 11.7–15.5)
Lymphs Abs: 2847 cells/uL (ref 850–3900)
MCH: 28 pg (ref 27.0–33.0)
MCHC: 33.6 g/dL (ref 32.0–36.0)
MCV: 83.3 fL (ref 80.0–100.0)
MPV: 10.4 fL (ref 7.5–12.5)
Monocytes Relative: 10 %
NEUTROS PCT: 43.2 %
Neutro Abs: 2808 cells/uL (ref 1500–7800)
PLATELETS: 278 10*3/uL (ref 140–400)
RBC: 4.61 10*6/uL (ref 3.80–5.10)
RDW: 13.1 % (ref 11.0–15.0)
TOTAL LYMPHOCYTE: 43.8 %
WBC: 6.5 10*3/uL (ref 3.8–10.8)
WBCMIX: 650 {cells}/uL (ref 200–950)

## 2018-03-04 LAB — COMPLETE METABOLIC PANEL WITH GFR
AG Ratio: 2.1 (calc) (ref 1.0–2.5)
ALBUMIN MSPROF: 4.1 g/dL (ref 3.6–5.1)
ALKALINE PHOSPHATASE (APISO): 74 U/L (ref 33–130)
ALT: 21 U/L (ref 6–29)
AST: 19 U/L (ref 10–35)
BUN: 17 mg/dL (ref 7–25)
CO2: 28 mmol/L (ref 20–32)
CREATININE: 0.86 mg/dL (ref 0.60–0.93)
Calcium: 9.3 mg/dL (ref 8.6–10.4)
Chloride: 105 mmol/L (ref 98–110)
GFR, Est African American: 78 mL/min/{1.73_m2} (ref >=60)
GFR, Est Non African American: 67 mL/min/{1.73_m2} (ref >=60)
GLOBULIN: 2 g/dL (ref 1.9–3.7)
Glucose, Bld: 90 mg/dL (ref 65–99)
Potassium: 5 mmol/L (ref 3.5–5.3)
SODIUM: 139 mmol/L (ref 135–146)
Total Bilirubin: 0.5 mg/dL (ref 0.2–1.2)
Total Protein: 6.1 g/dL (ref 6.1–8.1)

## 2018-03-04 LAB — LIPID PANEL
Cholesterol: 159 mg/dL (ref <200)
HDL: 64 mg/dL (ref >50)
LDL CHOLESTEROL (CALC): 80 mg/dL
Non-HDL Cholesterol (Calc): 95 mg/dL (calc) (ref <130)
Total CHOL/HDL Ratio: 2.5 (calc) (ref <5.0)
Triglycerides: 69 mg/dL (ref <150)

## 2018-03-04 LAB — TSH: TSH: 1.36 m[IU]/L (ref 0.40–4.50)

## 2018-03-04 NOTE — Telephone Encounter (Signed)
Clearance faxed. Message left on voicemail at Dr. Mingo Amber office that clearance has been faxed.

## 2018-03-04 NOTE — Telephone Encounter (Signed)
New Message    Wendy Rose with Dr. Lamont Dowdy is asking that the preop clearance be refaxed TO [phone]

## 2018-03-08 ENCOUNTER — Telehealth: Payer: Self-pay | Admitting: Internal Medicine

## 2018-03-08 ENCOUNTER — Ambulatory Visit (INDEPENDENT_AMBULATORY_CARE_PROVIDER_SITE_OTHER): Payer: PPO | Admitting: Internal Medicine

## 2018-03-08 ENCOUNTER — Encounter: Payer: Self-pay | Admitting: Internal Medicine

## 2018-03-08 VITALS — BP 120/70 | HR 70 | Ht 70.0 in | Wt 180.0 lb

## 2018-03-08 DIAGNOSIS — F411 Generalized anxiety disorder: Secondary | ICD-10-CM | POA: Diagnosis not present

## 2018-03-08 DIAGNOSIS — Z950 Presence of cardiac pacemaker: Secondary | ICD-10-CM

## 2018-03-08 DIAGNOSIS — K219 Gastro-esophageal reflux disease without esophagitis: Secondary | ICD-10-CM | POA: Diagnosis not present

## 2018-03-08 DIAGNOSIS — R0902 Hypoxemia: Secondary | ICD-10-CM

## 2018-03-08 DIAGNOSIS — M8580 Other specified disorders of bone density and structure, unspecified site: Secondary | ICD-10-CM | POA: Diagnosis not present

## 2018-03-08 DIAGNOSIS — R609 Edema, unspecified: Secondary | ICD-10-CM | POA: Diagnosis not present

## 2018-03-08 DIAGNOSIS — G713 Mitochondrial myopathy, not elsewhere classified: Secondary | ICD-10-CM

## 2018-03-08 DIAGNOSIS — M797 Fibromyalgia: Secondary | ICD-10-CM

## 2018-03-08 DIAGNOSIS — R8290 Unspecified abnormal findings in urine: Secondary | ICD-10-CM | POA: Diagnosis not present

## 2018-03-08 DIAGNOSIS — Z7901 Long term (current) use of anticoagulants: Secondary | ICD-10-CM

## 2018-03-08 DIAGNOSIS — Z0000 Encounter for general adult medical examination without abnormal findings: Secondary | ICD-10-CM | POA: Diagnosis not present

## 2018-03-08 DIAGNOSIS — R531 Weakness: Secondary | ICD-10-CM | POA: Diagnosis not present

## 2018-03-08 DIAGNOSIS — G609 Hereditary and idiopathic neuropathy, unspecified: Secondary | ICD-10-CM | POA: Diagnosis not present

## 2018-03-08 DIAGNOSIS — F5102 Adjustment insomnia: Secondary | ICD-10-CM

## 2018-03-08 DIAGNOSIS — F324 Major depressive disorder, single episode, in partial remission: Secondary | ICD-10-CM | POA: Diagnosis not present

## 2018-03-08 LAB — POCT URINALYSIS DIPSTICK
BILIRUBIN UA: NEGATIVE
Glucose, UA: NEGATIVE
KETONES UA: NEGATIVE
Nitrite, UA: NEGATIVE
PH UA: 6.5 (ref 5.0–8.0)
Protein, UA: NEGATIVE
Spec Grav, UA: 1.015 (ref 1.010–1.025)
UROBILINOGEN UA: 0.2 U/dL
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
## 2018-03-08 NOTE — Telephone Encounter (Signed)
Pt is aware of results and order placed to [facility]. Nothing more needed at this time.

## 2018-03-08 NOTE — Patient Instructions (Addendum)
Check B12 and folate levels.  See Dr. Doy Mince for follow-up of mitochondrial myopathy soon.  Continue counseling/treatment at [facility].  Continue treating fibromyalgia with hydrocodone APAP sparingly.  Follow-up in 3 months.  No change in medications.

## 2018-03-10 ENCOUNTER — Telehealth: Payer: Self-pay | Admitting: Internal Medicine

## 2018-03-10 LAB — URINE CULTURE
MICRO NUMBER:: 90899265
SPECIMEN QUALITY: ADEQUATE

## 2018-03-10 NOTE — Telephone Encounter (Signed)
Attempted to contact Caryl Pina with [employer]. I did not receive an answer. There was no option for me to leave a message. Will try back.

## 2018-03-11 LAB — PAIN MGMT, PROFILE 8 W/CONF, U
6 Acetylmorphine: NEGATIVE ng/mL (ref <10)
ALPHAHYDROXYMIDAZOLAM: NEGATIVE ng/mL (ref <50)
ALPHAHYDROXYTRIAZOLAM: NEGATIVE ng/mL (ref <50)
AMPHETAMINES: NEGATIVE ng/mL (ref <500)
Alcohol Metabolites: NEGATIVE ng/mL (ref <500)
Alphahydroxyalprazolam: 235 ng/mL — ABNORMAL HIGH (ref <25)
Aminoclonazepam: NEGATIVE ng/mL (ref <25)
BENZODIAZEPINES: POSITIVE ng/mL — AB (ref <100)
Buprenorphine, Urine: NEGATIVE ng/mL (ref <5)
COCAINE METABOLITE: NEGATIVE ng/mL (ref <150)
Codeine: NEGATIVE ng/mL (ref <50)
Creatinine: 97.6 mg/dL
HYDROCODONE: 941 ng/mL — AB (ref <50)
HYDROXYETHYLFLURAZEPAM: NEGATIVE ng/mL (ref <50)
Hydromorphone: NEGATIVE ng/mL (ref <50)
LORAZEPAM: NEGATIVE ng/mL (ref <50)
MDMA: NEGATIVE ng/mL (ref <500)
MORPHINE: NEGATIVE ng/mL (ref <50)
Marijuana Metabolite: NEGATIVE ng/mL (ref <20)
NORDIAZEPAM: NEGATIVE ng/mL (ref <50)
Norhydrocodone: 3029 ng/mL — ABNORMAL HIGH (ref <50)
OPIATES: POSITIVE ng/mL — AB (ref <100)
OXAZEPAM: NEGATIVE ng/mL (ref <50)
OXYCODONE: NEGATIVE ng/mL (ref <100)
Oxidant: NEGATIVE ug/mL (ref <200)
Temazepam: NEGATIVE ng/mL (ref <50)
pH: 6.84 (ref 4.5–9.0)

## 2018-03-11 LAB — VITAMIN B12: Vitamin B-12: >2000 pg/mL — ABNORMAL HIGH (ref 200–1100)

## 2018-03-11 LAB — FOLATE: FOLATE: 11.6 ng/mL

## 2018-03-11 NOTE — Telephone Encounter (Signed)
Called and spoke with Caryl Pina with [employer] at phone [phone] Riccardo Dubin that dx for pt's O2 needs to be Mitochondrial myopathy, AFib,  Chronic respiratory failure with hypoxemia He will add this dx to the order to be processed today. Nothing further needed.

## 2018-03-11 NOTE — Telephone Encounter (Signed)
Diagnoses for O2 2L during sleep      Mitochondrial myopathy, Atrial fibrillation,  Chronic respiratory failure with hypoxemia

## 2018-03-11 NOTE — Telephone Encounter (Signed)
Called and spoke with Wendy Rose with [employer] at phone [phone] He states that pt qualified for O2 per ONO, but pt does not have qualifying DX Pt needs to have a qualifying DX for the O2  CY please advise.

## 2018-03-24 DIAGNOSIS — R0902 Hypoxemia: Secondary | ICD-10-CM | POA: Diagnosis not present

## 2018-03-25 DIAGNOSIS — F39 Unspecified mood [affective] disorder: Secondary | ICD-10-CM | POA: Diagnosis not present

## 2018-04-04 ENCOUNTER — Other Ambulatory Visit: Payer: Self-pay

## 2018-04-04 ENCOUNTER — Encounter: Payer: Self-pay | Admitting: Internal Medicine

## 2018-04-04 ENCOUNTER — Telehealth: Payer: Self-pay | Admitting: Internal Medicine

## 2018-04-04 DIAGNOSIS — Z1231 Encounter for screening mammogram for malignant neoplasm of breast: Secondary | ICD-10-CM | POA: Diagnosis not present

## 2018-04-04 NOTE — Telephone Encounter (Signed)
Patient called to request a refill. Last refilled on 02/15/2018 #60.

## 2018-04-04 NOTE — Telephone Encounter (Signed)
Called patient unable to reach left message to give us a call back.

## 2018-04-05 ENCOUNTER — Telehealth: Payer: Self-pay | Admitting: Internal Medicine

## 2018-04-05 MED ORDER — HYDROCODONE-ACETAMINOPHEN 10-325 MG PO TABS: 1.0000 | ORAL_TABLET | Freq: Two times a day (BID) | ORAL | 0 refills | Status: DC

## 2018-04-05 NOTE — Telephone Encounter (Signed)
Refill Norco 10/325  #60 via e-scribe

## 2018-04-05 NOTE — Telephone Encounter (Signed)
Spoke with pt. She had questions about why she received an oxygen concentrator from [facility]. Advised her per her ONO from July, she needs oxygen while sleeping. She verbalized understanding. Nothing further was needed.

## 2018-04-24 DIAGNOSIS — R0902 Hypoxemia: Secondary | ICD-10-CM | POA: Diagnosis not present

## 2018-04-25 DIAGNOSIS — F39 Unspecified mood [affective] disorder: Secondary | ICD-10-CM | POA: Diagnosis not present

## 2018-05-04 ENCOUNTER — Encounter: Payer: Self-pay | Admitting: Internal Medicine

## 2018-05-10 ENCOUNTER — Telehealth: Payer: Self-pay | Admitting: Emergency Medicine

## 2018-05-10 MED ORDER — HYDROCODONE-ACETAMINOPHEN 10-325 MG PO TABS: 1.0000 | ORAL_TABLET | Freq: Two times a day (BID) | ORAL | 0 refills | Status: DC

## 2018-05-10 NOTE — Telephone Encounter (Signed)
Med refilled as requested. Has follow up appt here late October

## 2018-05-10 NOTE — Telephone Encounter (Signed)
Pt called and requested a refill on her HYDROcodone-acetaminophen (NORCO) 10-325 MG tablet. Pharmacy is [facility] on [address]. Thanks.

## 2018-05-13 ENCOUNTER — Other Ambulatory Visit: Payer: Self-pay | Admitting: Cardiovascular Disease

## 2018-05-13 NOTE — Telephone Encounter (Signed)
Dr. Claiborne Billings is the primary cardiologist, he prescribed this medication. Please address

## 2018-05-18 ENCOUNTER — Ambulatory Visit (INDEPENDENT_AMBULATORY_CARE_PROVIDER_SITE_OTHER): Payer: PPO | Admitting: *Deleted

## 2018-05-18 DIAGNOSIS — I495 Sick sinus syndrome: Secondary | ICD-10-CM | POA: Diagnosis not present

## 2018-05-19 NOTE — Progress Notes (Signed)
Remote pacemaker transmission.   

## 2018-05-23 LAB — CUP PACEART REMOTE DEVICE CHECK
Battery Remaining Percentage: 60 %
Brady Statistic AP VP Percent: 2 %
Brady Statistic AS VP Percent: 3 %
Brady Statistic AS VS Percent: 0 %
Brady Statistic RA Percent Paced: 1 %
Brady Statistic RV Percent Paced: 90 %
Implantable Lead Implant Date: 20140710
Implantable Lead Location: 753859
Implantable Lead Model: 346
Implantable Lead Model: 346
Implantable Lead Serial Number: 29426855
Lead Channel Setting Pacing Amplitude: 2.4 V
Lead Channel Setting Pacing Pulse Width: 0.4 ms
MDC IDC LEAD IMPLANT DT: 20140710
MDC IDC LEAD LOCATION: 753860
MDC IDC LEAD SERIAL: 29334372
MDC IDC MSMT LEADCHNL RA IMPEDANCE VALUE: 720 Ohm
MDC IDC MSMT LEADCHNL RV IMPEDANCE VALUE: 778 Ohm
MDC IDC PG IMPLANT DT: 20140710
MDC IDC SESS DTM: 20191014052851
MDC IDC SET LEADCHNL RA PACING AMPLITUDE: 2.4 V
MDC IDC STAT BRADY AP VS PERCENT: 0 %
Pulse Gen Serial Number: 66385898

## 2018-05-24 DIAGNOSIS — R0902 Hypoxemia: Secondary | ICD-10-CM | POA: Diagnosis not present

## 2018-05-30 ENCOUNTER — Ambulatory Visit (INDEPENDENT_AMBULATORY_CARE_PROVIDER_SITE_OTHER): Payer: PPO | Admitting: Internal Medicine

## 2018-05-30 DIAGNOSIS — R0609 Other forms of dyspnea: Secondary | ICD-10-CM | POA: Diagnosis not present

## 2018-05-30 LAB — PULMONARY FUNCTION TEST
DL/VA % pred: 68 %
DL/VA: 3.67 ml/min/mmHg/L
DLCO unc % pred: 61 %
DLCO unc: 19.01 ml/min/mmHg
FEF 25-75 Post: 4.02 L/sec
FEF 25-75 Pre: 3.7 L/sec
FEF2575-%Change-Post: 8 %
FEF2575-%PRED-PRE: 179 %
FEF2575-%Pred-Post: 195 %
FEV1-%Change-Post: 2 %
FEV1-%PRED-PRE: 122 %
FEV1-%Pred-Post: 125 %
FEV1-POST: 3.36 L
FEV1-PRE: 3.26 L
FEV1FVC-%CHANGE-POST: 2 %
FEV1FVC-%Pred-Pre: 111 %
FEV6-%CHANGE-POST: 0 %
FEV6-%PRED-POST: 115 %
FEV6-%PRED-PRE: 115 %
FEV6-PRE: 3.89 L
FEV6-Post: 3.91 L
FEV6FVC-%PRED-PRE: 104 %
FEV6FVC-%Pred-Post: 104 %
FVC-%Change-Post: 0 %
FVC-%Pred-Post: 110 %
FVC-%Pred-Pre: 110 %
FVC-Post: 3.91 L
FVC-Pre: 3.89 L
POST FEV6/FVC RATIO: 100 %
PRE FEV6/FVC RATIO: 100 %
Post FEV1/FVC ratio: 86 %
Pre FEV1/FVC ratio: 84 %
RV % PRED: 72 %
RV: 1.82 L
TLC % pred: 99 %
TLC: 5.79 L

## 2018-05-30 NOTE — Progress Notes (Signed)
PFT done today. 

## 2018-05-31 ENCOUNTER — Encounter: Payer: Self-pay | Admitting: Internal Medicine

## 2018-06-07 ENCOUNTER — Encounter: Payer: Self-pay | Admitting: Internal Medicine

## 2018-06-07 ENCOUNTER — Ambulatory Visit
Admission: RE | Admit: 2018-06-07 | Discharge: 2018-06-07 | Disposition: A | Discharge status: Home / Self Care | Payer: PPO | Source: Ambulatory Visit | Attending: Internal Medicine | Admitting: Internal Medicine

## 2018-06-07 ENCOUNTER — Other Ambulatory Visit: Payer: Self-pay

## 2018-06-07 ENCOUNTER — Ambulatory Visit (INDEPENDENT_AMBULATORY_CARE_PROVIDER_SITE_OTHER): Payer: PPO | Admitting: Internal Medicine

## 2018-06-07 VITALS — BP 138/80 | HR 69 | Ht 70.0 in | Wt 186.0 lb

## 2018-06-07 DIAGNOSIS — F324 Major depressive disorder, single episode, in partial remission: Secondary | ICD-10-CM

## 2018-06-07 DIAGNOSIS — R531 Weakness: Secondary | ICD-10-CM

## 2018-06-07 DIAGNOSIS — M797 Fibromyalgia: Secondary | ICD-10-CM

## 2018-06-07 DIAGNOSIS — F5102 Adjustment insomnia: Secondary | ICD-10-CM

## 2018-06-07 DIAGNOSIS — R0600 Dyspnea, unspecified: Secondary | ICD-10-CM | POA: Diagnosis not present

## 2018-06-07 DIAGNOSIS — G713 Mitochondrial myopathy, not elsewhere classified: Secondary | ICD-10-CM

## 2018-06-07 DIAGNOSIS — R609 Edema, unspecified: Secondary | ICD-10-CM

## 2018-06-07 DIAGNOSIS — R0602 Shortness of breath: Secondary | ICD-10-CM | POA: Diagnosis not present

## 2018-06-07 DIAGNOSIS — Z950 Presence of cardiac pacemaker: Secondary | ICD-10-CM | POA: Diagnosis not present

## 2018-06-07 DIAGNOSIS — F411 Generalized anxiety disorder: Secondary | ICD-10-CM | POA: Diagnosis not present

## 2018-06-07 DIAGNOSIS — G894 Chronic pain syndrome: Secondary | ICD-10-CM
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
## 2018-06-09 ENCOUNTER — Ambulatory Visit (INDEPENDENT_AMBULATORY_CARE_PROVIDER_SITE_OTHER): Payer: PPO | Admitting: Internal Medicine

## 2018-06-09 ENCOUNTER — Encounter: Payer: Self-pay | Admitting: Internal Medicine

## 2018-06-09 VITALS — BP 124/66 | HR 98 | Ht 70.0 in | Wt 186.6 lb

## 2018-06-09 DIAGNOSIS — I272 Pulmonary hypertension, unspecified: Secondary | ICD-10-CM | POA: Diagnosis not present

## 2018-06-09 DIAGNOSIS — F5101 Primary insomnia: Secondary | ICD-10-CM | POA: Diagnosis not present

## 2018-06-09 DIAGNOSIS — G713 Mitochondrial myopathy, not elsewhere classified: Secondary | ICD-10-CM

## 2018-06-09 DIAGNOSIS — R0609 Other forms of dyspnea: Secondary | ICD-10-CM | POA: Diagnosis not present

## 2018-06-09 LAB — CBC WITH DIFFERENTIAL/PLATELET
BASOS ABS: 40 {cells}/uL (ref 0–200)
Basophils Relative: 0.6 %
EOS PCT: 2.4 %
Eosinophils Absolute: 161 cells/uL (ref 15–500)
HEMATOCRIT: 37.9 % (ref 35.0–45.0)
Hemoglobin: 12.6 g/dL (ref 11.7–15.5)
LYMPHS ABS: 1755 {cells}/uL (ref 850–3900)
MCH: 27.5 pg (ref 27.0–33.0)
MCHC: 33.2 g/dL (ref 32.0–36.0)
MCV: 82.8 fL (ref 80.0–100.0)
MPV: 11.6 fL (ref 7.5–12.5)
Monocytes Relative: 10.3 %
NEUTROS PCT: 60.5 %
Neutro Abs: 4054 cells/uL (ref 1500–7800)
Platelets: 189 10*3/uL (ref 140–400)
RBC: 4.58 10*6/uL (ref 3.80–5.10)
RDW: 13.9 % (ref 11.0–15.0)
Total Lymphocyte: 26.2 %
WBC mixed population: 690 cells/uL (ref 200–950)
WBC: 6.7 10*3/uL (ref 3.8–10.8)

## 2018-06-09 LAB — COMPREHENSIVE METABOLIC PANEL
AG RATIO: 2 (calc) (ref 1.0–2.5)
ALBUMIN MSPROF: 4.1 g/dL (ref 3.6–5.1)
ALT: 18 U/L (ref 6–29)
AST: 20 U/L (ref 10–35)
Alkaline phosphatase (APISO): 76 U/L (ref 33–130)
BUN: 18 mg/dL (ref 7–25)
CHLORIDE: 108 mmol/L (ref 98–110)
CO2: 26 mmol/L (ref 20–32)
Calcium: 9.4 mg/dL (ref 8.6–10.4)
Creat: 0.65 mg/dL (ref 0.60–0.93)
GLUCOSE: 95 mg/dL (ref 65–99)
Globulin: 2.1 g/dL (calc) (ref 1.9–3.7)
Potassium: 4.4 mmol/L (ref 3.5–5.3)
Sodium: 142 mmol/L (ref 135–146)
TOTAL PROTEIN: 6.2 g/dL (ref 6.1–8.1)
Total Bilirubin: 0.5 mg/dL (ref 0.2–1.2)

## 2018-06-09 LAB — TSH: TSH: 0.82 m[IU]/L (ref 0.40–4.50)

## 2018-06-09 LAB — BRAIN NATRIURETIC PEPTIDE

## 2018-06-09 NOTE — Progress Notes (Signed)
HPI F never smoker followed for SOB and cough, complicated by A. Fib/ pacemaker, , headaches, depression, fibromyalgia, GERD, IBS, mitochondrial myopathy Husband Wendy Rose was patient here with severe asthma/COPD, expired Walk Test on room air 02/28/2018-maximum heart rate 115, lowest saturation 96% PFT 05/30/2018-moderate diffusion defect with normal flows and lung volumes, no response to dilator.  FVC 3.91/110%, FEV1 3.36/125%, ratio 1.86, TLC 99%, DLCO 61% --------------------------------------------------------------------- 02/28/2018- 72 yoFnever smoker followed for SOB and cough, complicated by A. Fib/ pacemaker, , headaches, depression, fibromyalgia, GERD, IBS, mitochondrial myopathy ----A Neurologist from [facility] has asked her to come here and follow up. She states she always has mucus in her throat. SOB with activity. She describes dyspnea only with exertion.  Occasionally cough, mostly dry with no wheeze.  This seems stable to her.  Lives in a Wendy Rose home.  Because of her myopathy she has significant difficulty with stairs and carrying groceries.  Legs are weak.  Not aware of heart problems. Chokes easily with food and drink-chronic and stable.  She says this is "family" characteristic.  No recent pneumonia. Walk Test on room air 02/28/2018-maximum heart rate 115, lowest saturation 96% Up-to-date on pneumonia vaccines  06/09/2018- 72 yoFnever smoker followed for SOB and cough, complicated by A. Fib/ pacemaker,, headaches, depression, fibromyalgia, GERD, IBS, mitochondrial myopathy -----Cough and DOE: Pt continues to feel SOB with exertion; increased swelling in feet and hands. PCP wanted follow up with CY first prior to giving medications.  O2  2 L sleep/[employer] after abnormal overnight oximetry. Arrival saturation 100%, BP 124/66, pulse 98 Not anemic on recent CBC. Dr Renold Genta has ordered some labs evaluating peripheral edema. Stable dyspnea on exertion. Planes of difficulty initiating and  maintaining sleep-"busy brain"-loss of husband and concerns about her own health. Tried Abilify for sleep but it caused weight gain.  She is mostly sleeping in the daytime. CXR 06/08/18-  The cardiomediastinal silhouette is unremarkable. LEFT-sided pacemaker again noted. There is no evidence of focal airspace disease, pulmonary edema, suspicious pulmonary nodule/mass, pleural effusion, or pneumothorax. No acute bony abnormalities are identified. No active cardiopulmonary disease. PFT 05/30/2018-moderate diffusion defect with normal flows and lung volumes, no response to dilator.  FVC 3.91/110%, FEV1 3.36/125%, ratio 1.86, TLC 99%, DLCO 61%  ROS-see HPI   + = positive Constitutional:    weight loss, night sweats, fevers, chills, fatigue, lassitude. HEENT:    headaches, difficulty swallowing, tooth/dental problems, sore throat,       sneezing, itching, ear ache, nasal congestion, + post nasal drip, snoring CV:    chest pain, orthopnea, PND, swelling in lower extremities, anasarca,                                                  dizziness, palpitations Resp:   +shortness of breath with exertion or at rest.                productive cough,  + non-productive cough, coughing up of blood.              change in color of mucus.  wheezing.   Skin:    rash or lesions. GI:  + heartburn, indigestion, abdominal pain, nausea, vomiting, diarrhea,                 change in bowel habits, loss of appetite GU: dysuria, change in color of urine,  no urgency or frequency.   flank pain. MS:   joint pain, stiffness, decreased range of motion, back pain. Neuro-     nothing unusual Psych:  change in mood or affect.  depression or anxiety.   memory loss.  OBJ- Physical Exam General- Alert, Oriented, Affect-appropriate, Distress- none acute Skin- rash-none, lesions- none, excoriation- none Lymphadenopathy- none Head- atraumatic            Eyes- Gross vision intact, PERRLA, conjunctivae and secretions clear             Ears- Hearing, canals-normal            Nose- Clear, no-Septal dev, mucus, polyps, erosion, perforation             Throat- Mallampati II , mucosa clear , drainage- none, tonsils- atrophic Neck- flexible , trachea midline, no stridor , thyroid nl, carotid no bruit Chest - symmetrical excursion , unlabored           Heart/CV- RRR , no murmur , no gallop  , no rub, nl s1 s2                           - JVD- none , edema- none, stasis changes- none, varices- none           Lung- clear to P&A, wheeze- none, cough- none at this visit , dullness-none, rub- none           Chest wall-+ pacemaker left Abd-  Br/ Gen/ Rectal- Not done, not indicated Extrem- cyanosis- none, clubbing, none, atrophy- none, strength + cane Neuro- grossly intact to observation

## 2018-06-09 NOTE — Patient Instructions (Signed)
Ok to try adding melatonin at bedtime. Try to sit down and make a list of issues, planning and such about an hour before bedtime, so its done and you aren't trying to do it all in your head after you go to bed.  Order- schedule echocardiogram     Dx dyspnea on exertion, question pulmonary hypertension  Please call if we can help

## 2018-06-10 ENCOUNTER — Other Ambulatory Visit: Payer: PPO | Admitting: Internal Medicine

## 2018-06-10 DIAGNOSIS — I270 Primary pulmonary hypertension: Secondary | ICD-10-CM

## 2018-06-10 DIAGNOSIS — R0609 Other forms of dyspnea: Secondary | ICD-10-CM | POA: Diagnosis not present

## 2018-06-10 LAB — BRAIN NATRIURETIC PEPTIDE: Brain Natriuretic Peptide: 132 pg/mL — ABNORMAL HIGH (ref <100)

## 2018-06-14 ENCOUNTER — Other Ambulatory Visit: Payer: Self-pay

## 2018-06-14 MED ORDER — HYDROCODONE-ACETAMINOPHEN 10-325 MG PO TABS: 1.0000 | ORAL_TABLET | Freq: Two times a day (BID) | ORAL | 0 refills | Status: DC

## 2018-06-14 NOTE — Telephone Encounter (Signed)
Patient called to request a refill on [place] last refill 05/10/2018, last OV 06/07/2018.

## 2018-06-17 ENCOUNTER — Ambulatory Visit (HOSPITAL_COMMUNITY): Payer: PPO | Attending: Internal Medicine

## 2018-06-17 ENCOUNTER — Other Ambulatory Visit: Payer: Self-pay

## 2018-06-17 ENCOUNTER — Ambulatory Visit (INDEPENDENT_AMBULATORY_CARE_PROVIDER_SITE_OTHER): Payer: PPO | Admitting: Internal Medicine

## 2018-06-17 ENCOUNTER — Encounter: Payer: Self-pay | Admitting: Internal Medicine

## 2018-06-17 VITALS — BP 126/70 | HR 60 | Ht 70.0 in | Wt 184.6 lb

## 2018-06-17 DIAGNOSIS — I495 Sick sinus syndrome: Secondary | ICD-10-CM

## 2018-06-17 DIAGNOSIS — R001 Bradycardia, unspecified: Secondary | ICD-10-CM | POA: Diagnosis not present

## 2018-06-17 DIAGNOSIS — R609 Edema, unspecified: Secondary | ICD-10-CM | POA: Diagnosis not present

## 2018-06-17 DIAGNOSIS — Z7901 Long term (current) use of anticoagulants: Secondary | ICD-10-CM

## 2018-06-17 DIAGNOSIS — I272 Pulmonary hypertension, unspecified: Secondary | ICD-10-CM | POA: Diagnosis not present

## 2018-06-17 DIAGNOSIS — R0609 Other forms of dyspnea: Secondary | ICD-10-CM | POA: Diagnosis not present

## 2018-06-17 DIAGNOSIS — I4819 Other persistent atrial fibrillation: Secondary | ICD-10-CM

## 2018-06-17 NOTE — Progress Notes (Signed)
PCP: Elby Showers, MD Primary Cardiologist: Dr Claiborne Billings Primary EP:  Dr Rayann Heman  Wendy Rose is a 72 y.o. female who presents today for routine electrophysiology followup.  Since last being seen in our clinic, the patient reports doing very well.  Today, she denies symptoms of palpitations, chest pain, shortness of breath,  lower extremity edema, dizziness, presyncope, or syncope.  The patient is otherwise without complaint today.   Past Medical History:  Diagnosis Date  . A-fib ([place])   . Anemia   . Anxiety   . Arthritis   . Chronic headaches   . Depression   . Fibromyalgia   . GERD (gastroesophageal reflux disease)   . History of echocardiogram    a. echo 02/2013: normal LV function with an EF of 55-60%, mild MR, mild TR, and PA peak pressure of 40 mmHg  . Hyperlipemia   . IBS (irritable bowel syndrome)   . MVP (mitral valve prolapse)   . Pneumonia   . PONV (postoperative nausea and vomiting)   . S/P cardiac pacemaker procedure, 02/16/13, Bio tronik device 02/19/2013  . Symptomatic bradycardia    s/p Biotronik Evia dual chamber pacemaker implant 02/16/2013 by Dr Rayann Heman  . UTI (lower urinary tract infection)   . Vertigo    Past Surgical History:  Procedure Laterality Date  . BLADDER SUSPENSION    . CESAREAN SECTION    . DILATION AND CURETTAGE OF UTERUS    . PACEMAKER INSERTION  02/16/2013   Biotronik Evia dual chamber pacemaker implanted by Dr Rayann Heman  . PERMANENT PACEMAKER INSERTION N/A 02/16/2013   Procedure: PERMANENT PACEMAKER INSERTION;  Surgeon: Thompson Grayer, MD;  Location: [facility] CATH LAB;  Service: Cardiovascular;  Laterality: N/A;  . TONSILLECTOMY AND ADENOIDECTOMY    . TOTAL ABDOMINAL HYSTERECTOMY    . TYMPANOPLASTY      ROS- all systems are reviewed and negative except as per HPI above  Current Outpatient Medications  Medication Sig Dispense Refill  . ALPRAZolam (XANAX) 1 MG tablet take 1 tablet by mouth twice a day if needed FOR ANXIETY. 60 tablet 5  .  ARIPiprazole (ABILIFY) 5 MG tablet Take 5 mg by mouth every morning.  0  . atorvastatin (LIPITOR) 20 MG tablet Take 1 tablet (20 mg total) by mouth daily at 6 PM. PT OVERDUE FOR OV PLEASE CALL FOR APPT 30 tablet 0  . B Complex-C (B-COMPLEX WITH VITAMIN C) tablet Take 1 tablet by mouth daily.    Marland Kitchen buPROPion (WELLBUTRIN XL) 150 MG 24 hr tablet Take 150 mg by mouth every morning.  0  . calcium-vitamin D (CALCIUM 500+D) 500-200 MG-UNIT per tablet Take 1 tablet by mouth 2 (two) times daily.     Marland Kitchen co-enzyme Q-10 50 MG capsule Take 50 mg by mouth 2 (two) times daily.    . Creatinine POWD Take by mouth as directed.     Marland Kitchen ELIQUIS 5 MG TABS tablet TAKE 1 TABLET BY MOUTH TWICE A DAY 180 tablet 1  . HYDROcodone-acetaminophen (NORCO) 10-325 MG tablet Take 1 tablet by mouth every 12 (twelve) hours. 60 tablet 0  . Magnesium Oxide (MAG-200 PO) Take 1 tablet by mouth 2 (two) times daily.     . pantoprazole (PROTONIX) 40 MG tablet Take 1 tablet (40 mg total) by mouth daily. 90 tablet 3  . sertraline (ZOLOFT) 100 MG tablet Take 1 tablet by mouth daily.  1   No current facility-administered medications for this visit.     Physical Exam:  Vitals:   06/17/18 1530  BP: 126/70  Pulse: 60  SpO2: 98%  Weight: 184 lb 9.6 oz (83.7 kg)  Height: 5\' 10"  (1.778 m)    GEN- The patient is well appearing, alert and oriented x 3 today.   Head- normocephalic, atraumatic Eyes-  Sclera clear, conjunctiva pink Ears- hearing intact Oropharynx- clear Lungs- Clear to ausculation bilaterally, normal work of breathing Chest- pacemaker pocket is well healed Heart- Regular rate and rhythm (paced) GI- soft, NT, ND, + BS Extremities- no clubbing, cyanosis,+1 edema  Pacemaker interrogation- reviewed in detail today,  See PACEART report  Echo reviewed with patient today,  EF normal,  Mild MR,TR   Assessment and Plan:  1. Symptomatic sinus bradycardia  Normal pacemaker function See Pace Art report Reprogrammed VVI-CLS due  to afib today  2. Atrial fibrillation AF burden is 100% Continue eliquis for chads2vasc score of 4  3. Edema Relatively new 2 gram sodium diet and compression stockings advised EF is preserved She also has SOB I will have her follow-up with Dr Claiborne Billings for further general cardiology care  Remote monitoring Return in 1 year to see EP PA Follow-up with Dr Claiborne Billings as scheduled  Thompson Grayer MD, [facility] 06/17/2018 4:56 PM

## 2018-06-17 NOTE — Patient Instructions (Addendum)
Medication Instructions:  Your physician recommends that you continue on your current medications as directed. Please refer to the Current Medication list given to you today.  If you need a refill on your cardiac medications before your next appointment, please call your pharmacy.   Lab work: None ordered  Testing/Procedures: None ordered  Follow-Up: Your physician recommends that you schedule a follow-up appointment with Dr. Claiborne Billings.  Remote monitoring is used to monitor your Pacemaker of ICD from home. This monitoring reduces the number of office visits required to check your device to one time per year. It allows [place] to keep an eye on the functioning of your device to ensure it is working properly. You are scheduled for a device check from home on 08/17/2018. You may send your transmission at any time that day. If you have a wireless device, the transmission will be sent automatically. After your physician reviews your transmission, you will receive a postcard with your next transmission date.  At [facility], you and your health needs are our priority.  As part of our continuing mission to provide you with exceptional heart care, we have created designated Provider Care Teams.  These Care Teams include your primary Cardiologist (physician) and Advanced Practice Providers (APPs -  Physician Assistants and Nurse Practitioners) who all work together to provide you with the care you need, when you need it. . You will need a follow up appointment in 1 year with Tommye Standard, PA-C  Thank you for choosing [facility]!!

## 2018-06-24 DIAGNOSIS — R0902 Hypoxemia: Secondary | ICD-10-CM | POA: Diagnosis not present

## 2018-06-29 ENCOUNTER — Other Ambulatory Visit: Payer: Self-pay

## 2018-06-29 MED ORDER — ATORVASTATIN CALCIUM 20 MG PO TABS: 20.0000 mg | ORAL_TABLET | Freq: Every day | ORAL | 4 refills | Status: DC

## 2018-07-04 ENCOUNTER — Encounter: Payer: Self-pay | Admitting: Emergency Medicine

## 2018-07-04 DIAGNOSIS — G4700 Insomnia, unspecified: Secondary | ICD-10-CM | POA: Insufficient documentation

## 2018-07-18 ENCOUNTER — Encounter: Payer: Self-pay | Admitting: Psychiatry

## 2018-07-18 ENCOUNTER — Ambulatory Visit (INDEPENDENT_AMBULATORY_CARE_PROVIDER_SITE_OTHER): Payer: PPO | Admitting: Psychiatry

## 2018-07-18 VITALS — BP 122/62 | HR 66

## 2018-07-18 DIAGNOSIS — F419 Anxiety disorder, unspecified: Secondary | ICD-10-CM | POA: Diagnosis not present

## 2018-07-18 DIAGNOSIS — F329 Major depressive disorder, single episode, unspecified: Secondary | ICD-10-CM

## 2018-07-18 DIAGNOSIS — F5101 Primary insomnia: Secondary | ICD-10-CM | POA: Diagnosis not present

## 2018-07-18 DIAGNOSIS — F32A Depression, unspecified: Secondary | ICD-10-CM

## 2018-07-18 MED ORDER — BUPROPION HCL ER (XL) 300 MG PO TB24: 300.0000 mg | ORAL_TABLET | ORAL | 1 refills | Status: DC

## 2018-07-18 MED ORDER — SERTRALINE HCL 100 MG PO TABS: 100.0000 mg | ORAL_TABLET | Freq: Every day | ORAL | 1 refills | Status: DC

## 2018-07-18 MED ORDER — ALPRAZOLAM 1 MG PO TABS: 1.0000 mg | ORAL_TABLET | Freq: Two times a day (BID) | ORAL | 2 refills | Status: DC | PRN

## 2018-07-18 NOTE — Progress Notes (Signed)
Wendy Rose Amon [id number] 1946/06/02 72 y.o.  Subjective:   Patient ID:  Wendy Rose is a 72 y.o. (DOB Jan 23, 1946) female.  Chief Complaint:  Chief Complaint  Patient presents with  . Insomnia  . Anxiety  . Depression    HPI Wendy Rose presents to the office today for follow-up of depression and anxiety. "I've done better." Reports that last week someone was repeatedly knocking on her doors at 1 am and was trying to get in her screen door. Police came and determined it was a neighbor that was lost. Reports that pulmonologist recommended Melatonin and she has been sleeping well with Melatonin and 1/2 tab of Xanax. Reports sleeping 14 hours when she took Melatonin and Xanax 1 mg. She reports anxiety is "ok."  Reports that sleep has been more consistent. She reports that she stopped taking Abilify. Reports that her appetite is no longer as effective. "I feel like I am ok" in terms of mood. Reports occasional sad mood- "I don't have it for days." She reports that her house is unkempt and cannot get herself to do certain tasks. Reports that she was away for 3 weeks and feels overwhelmed with mail that came while she was away. She reports that her energy is low. She reports that she wants to complete tasks, "I just don't want to do them now." Denies SI- "I'm afraid to die."   Reports that she has upcoming apt with cardiologist.   Recent visit to see niece in [place] for 3 weeks.   Past Medication Trials: Abilify- wt gain Zyprexa- excessive sedation Seroquel- excessive sedation Prozac- took for years Wellbutrin XL Xanax Ativan  Review of Systems:  Review of Systems  Constitutional: Positive for chills.  Respiratory: Positive for cough.   Musculoskeletal: Positive for back pain and gait problem.  Allergic/Immunologic: Positive for environmental allergies.  Neurological: Negative for tremors.  Psychiatric/Behavioral:       Please refer to HPI    Medications: I have reviewed the  patient's current medications.  Current Outpatient Medications  Medication Sig Dispense Refill  . ALPRAZolam (XANAX) 1 MG tablet Take 1 tablet (1 mg total) by mouth 2 (two) times daily as needed for anxiety. take 1 tablet by mouth twice a day if needed FOR ANXIETY. 60 tablet 2  . atorvastatin (LIPITOR) 20 MG tablet Take 1 tablet (20 mg total) by mouth daily at 6 PM. 30 tablet 4  . B Complex-C (B-COMPLEX WITH VITAMIN C) tablet Take 1 tablet by mouth daily.    Marland Kitchen buPROPion (WELLBUTRIN XL) 300 MG 24 hr tablet Take 1 tablet (300 mg total) by mouth every morning. 90 tablet 1  . calcium-vitamin D (CALCIUM 500+D) 500-200 MG-UNIT per tablet Take 1 tablet by mouth 2 (two) times daily.     Marland Kitchen co-enzyme Q-10 50 MG capsule Take 50 mg by mouth 2 (two) times daily.    . Creatinine POWD Take by mouth as directed.     Marland Kitchen ELIQUIS 5 MG TABS tablet TAKE 1 TABLET BY MOUTH TWICE A DAY 180 tablet 1  . HYDROcodone-acetaminophen (NORCO) 10-325 MG tablet Take 1 tablet by mouth every 12 (twelve) hours. 60 tablet 0  . MAGNESIUM MALATE PO Take by mouth.    . pantoprazole (PROTONIX) 40 MG tablet Take 1 tablet (40 mg total) by mouth daily. 90 tablet 3  . sertraline (ZOLOFT) 100 MG tablet Take 1 tablet (100 mg total) by mouth daily. 90 tablet 1  . Magnesium Oxide (MAG-200 PO) Take  1 tablet by mouth 2 (two) times daily.      No current facility-administered medications for this visit.     Medication Side Effects: None  Allergies:  Allergies  Allergen Reactions  . Contrast Media [Iodinated Diagnostic Agents] Shortness Of Breath    "difficulty breathing"  . Ciprofloxacin Itching    Pt stated itching   . Sulfa Antibiotics     Other reaction(s): Unknown  . Cefuroxime Axetil Rash    REACTION: Rash  . Codeine Other (See Comments)    REACTION: Reaction not known  . Naproxen Other (See Comments)    REACTION: Reaction not known  . Nitrofurantoin Rash    REACTION: Increase LFT's  . Sulfonamide Derivatives Other (See  Comments)    REACTION: Reaction not known  . Sulindac Other (See Comments)    REACTION: Reaction not known    Past Medical History:  Diagnosis Date  . A-fib ([place])   . Anemia   . Anxiety   . Arthritis   . Chronic headaches   . Depression   . Fibromyalgia   . GERD (gastroesophageal reflux disease)   . History of echocardiogram    a. echo 02/2013: normal LV function with an EF of 55-60%, mild MR, mild TR, and PA peak pressure of 40 mmHg  . Hyperlipemia   . IBS (irritable bowel syndrome)   . MVP (mitral valve prolapse)   . Pneumonia   . PONV (postoperative nausea and vomiting)   . S/P cardiac pacemaker procedure, 02/16/13, Bio tronik device 02/19/2013  . Symptomatic bradycardia    s/p Biotronik Evia dual chamber pacemaker implant 02/16/2013 by Dr Rayann Heman  . UTI (lower urinary tract infection)   . Vertigo     Family History  Problem Relation Age of Onset  . Hypertension Mother   . Heart disease Father   . Alcohol abuse Father   . Diabetes Sister   . Alcohol abuse Sister   . Heart disease Brother   . Colon cancer Neg Hx   . Esophageal cancer Neg Hx   . Stomach cancer Neg Hx   . Rectal cancer Neg Hx     Social History   Socioeconomic History  . Marital status: Married    Spouse name: Not on file  . Number of children: Not on file  . Years of education: Not on file  . Highest education level: Not on file  Occupational History  . Not on file  Social Needs  . Financial resource strain: Not on file  . Food insecurity:    Worry: Not on file    Inability: Not on file  . Transportation needs:    Medical: Not on file    Non-medical: Not on file  Tobacco Use  . Smoking status: Never Smoker  . Smokeless tobacco: Never Used  Substance and Sexual Activity  . Alcohol use: No    Alcohol/week: 0.0 standard drinks  . Drug use: No  . Sexual activity: Yes  Lifestyle  . Physical activity:    Days per week: Not on file    Minutes per session: Not on file  . Stress: Not on  file  Relationships  . Social connections:    Talks on phone: Not on file    Gets together: Not on file    Attends religious service: Not on file    Active member of club or organization: Not on file    Attends meetings of clubs or organizations: Not on file    Relationship  status: Not on file  . Intimate partner violence:    Fear of current or ex partner: Not on file    Emotionally abused: Not on file    Physically abused: Not on file    Forced sexual activity: Not on file  Other Topics Concern  . Not on file  Social History Narrative  . Not on file    Past Medical History, Surgical history, Social history, and Family history were reviewed and updated as appropriate.   Please see review of systems for further details on the patient's review from today.   Objective:   Physical Exam:  BP 122/62   Pulse 66   Physical Exam  Constitutional: She is oriented to person, place, and time. She appears well-developed. No distress.  Musculoskeletal:  Ambulates with cane.   Neurological: She is alert and oriented to person, place, and time. Coordination normal.  Psychiatric: She has a normal mood and affect. Her speech is normal and behavior is normal. Judgment and thought content normal. Her mood appears not anxious. Her affect is not angry, not blunt, not labile and not inappropriate. Cognition and memory are normal. She does not exhibit a depressed mood. She expresses no homicidal and no suicidal ideation. She expresses no suicidal plans and no homicidal plans.  Insight intact. No auditory or visual hallucinations. No delusions.     Lab Review:     Component Value Date/Time   NA 142 06/07/2018 1558   K 4.4 06/07/2018 1558   CL 108 06/07/2018 1558   CO2 26 06/07/2018 1558   GLUCOSE 95 06/07/2018 1558   BUN 18 06/07/2018 1558   CREATININE 0.65 06/07/2018 1558   CALCIUM 9.4 06/07/2018 1558   PROT 6.2 06/07/2018 1558   ALBUMIN 3.7 05/06/2017 2041   AST 20 06/07/2018 1558   ALT  18 06/07/2018 1558   ALKPHOS 68 05/06/2017 2041   BILITOT 0.5 06/07/2018 1558   GFRNONAA 67 03/04/2018 1004   GFRAA 78 03/04/2018 1004       Component Value Date/Time   WBC 6.7 06/07/2018 1558   RBC 4.58 06/07/2018 1558   HGB 12.6 06/07/2018 1558   HCT 37.9 06/07/2018 1558   PLT 189 06/07/2018 1558   MCV 82.8 06/07/2018 1558   MCH 27.5 06/07/2018 1558   MCHC 33.2 06/07/2018 1558   RDW 13.9 06/07/2018 1558   LYMPHSABS 1,755 06/07/2018 1558   MONOABS 0.9 05/06/2017 2041   EOSABS 161 06/07/2018 1558   BASOSABS 40 06/07/2018 1558    No results found for: POCLITH, LITHIUM   No results found for: PHENYTOIN, PHENOBARB, VALPROATE, CBMZ   .res Assessment: Plan:   Continue Wellbutrin XL 300 mg po q am for depression. Continue Sertraline 100 mg po qd for depression and anxiety.  Continue Xanax 1 mg 1/2-1 po qhs prn insomnia. Depression, unspecified depression type - Plan: buPROPion (WELLBUTRIN XL) 300 MG 24 hr tablet, sertraline (ZOLOFT) 100 MG tablet  Anxiety - Plan: sertraline (ZOLOFT) 100 MG tablet, ALPRAZolam (XANAX) 1 MG tablet, DISCONTINUED: ALPRAZolam (XANAX) 1 MG tablet  Primary insomnia - Plan: ALPRAZolam (XANAX) 1 MG tablet, DISCONTINUED: ALPRAZolam (XANAX) 1 MG tablet  Please see After Visit Summary for patient specific instructions.  Future Appointments  Date Time Provider [place]  07/21/2018  2:00 PM Almyra Deforest, [place] [facility]  08/17/2018  9:50 AM [facility] DEVICE REMOTES [facility]  10/10/2018  1:40 PM Troy Sine, MD [facility]  12/08/2018  2:30 PM Deneise Lever, MD [facility] None  01/16/2019  2:00 PM Thayer Headings, PMHNP CP-CP None  02/13/2019  2:15 PM Hayden Pedro, MD TRE-TRE None    No orders of the defined types were placed in this encounter.     -------------------------------

## 2018-07-21 ENCOUNTER — Ambulatory Visit (INDEPENDENT_AMBULATORY_CARE_PROVIDER_SITE_OTHER): Payer: PPO | Admitting: Physician Assistant

## 2018-07-21 ENCOUNTER — Encounter: Payer: Self-pay | Admitting: Physician Assistant

## 2018-07-21 VITALS — BP 144/90 | HR 81 | Ht 70.0 in | Wt 187.2 lb

## 2018-07-21 DIAGNOSIS — G713 Mitochondrial myopathy, not elsewhere classified: Secondary | ICD-10-CM | POA: Diagnosis not present

## 2018-07-21 DIAGNOSIS — E785 Hyperlipidemia, unspecified: Secondary | ICD-10-CM

## 2018-07-21 DIAGNOSIS — I4892 Unspecified atrial flutter: Secondary | ICD-10-CM | POA: Diagnosis not present

## 2018-07-21 DIAGNOSIS — R600 Localized edema: Secondary | ICD-10-CM | POA: Diagnosis not present

## 2018-07-21 DIAGNOSIS — Z950 Presence of cardiac pacemaker: Secondary | ICD-10-CM

## 2018-07-21 MED ORDER — HYDROCHLOROTHIAZIDE 12.5 MG PO TABS: 12.5000 mg | ORAL_TABLET | Freq: Every day | ORAL | 1 refills | Status: DC

## 2018-07-21 NOTE — Patient Instructions (Addendum)
Recap of today's visit Recent echocardiogram showed elevated right heart pressure, physical exam showed ankle edema, I have recommended a trial of HCTZ 12.5 mg daily to help reduce both ankle edema and right heart pressure.  Medication Instructions:  Start Hydrochlorothiazide 12.5 daily. If you need a refill on your cardiac medications before your next appointment, please call your pharmacy.   Lab work: BMET in one week If you have labs (blood work) drawn today and your tests are completely normal, you will receive your results only by: Marland Kitchen MyChart Message (if you have MyChart) OR . A paper copy in the mail If you have any lab test that is abnormal or we need to change your treatment, we will call you to review the results.  Testing/Procedures: None  Follow-Up: At [facility], you and your health needs are our priority.  As part of our continuing mission to provide you with exceptional heart care, we have created designated Provider Care Teams.  These Care Teams include your primary Cardiologist (physician) and Advanced Practice Providers (APPs -  Physician Assistants and Nurse Practitioners) who all work together to provide you with the care you need, when you need it. You will need a follow up appointment in 3 months.  Please call our office 2 months in advance to schedule this appointment.  You may see Dr. Claiborne Billings or one of the following Advanced Practice Providers on your designated Care Team: [place], [place] . Fabian Sharp, PA-C  Any Other Special Instructions Will Be Listed Below (If Applicable). None

## 2018-07-21 NOTE — Progress Notes (Signed)
Cardiology Office Note    Date:  07/23/2018   ID:  Wendy Rose, DOB 12-Dec-1945, MRN [id number]  PCP:  Elby Showers, MD  Cardiologist: Dr. Claiborne Billings Primary electrophysiologist: Dr. Rayann Heman  Chief Complaint  Patient presents with  . Follow-up    f/u from echo, does mention swelling in feet/ankles, and sob noted.     History of Present Illness:  Wendy Rose is a 72 y.o. female with past medical history of mitochondrial myopathy followed at [facility], symptomatic bradycardia s/p PPM, hypelipidemia, paroxysmal atrial flutter on Eliquis.  Patient has not been seen by Dr. Claiborne Billings since June 2016.  She has been followed by Dr. Rayann Heman outside for her device.  Previous echocardiogram in July 2014 showed EF of 55 to 60%, mild MR, mild TR, PA PA pressure 40 mmHg.  Nuclear stress test at the time showed a small fixed apical defect that was thought to be related to pacing artifact, otherwise no reversible ischemia identified.  Her last general cardiology office visit was was Darlen Round, PA-C on 08/20/2016.  She was recently seen by Dr. Rayann Heman on 06/17/2018, she reportedly was doing very well.  Device interrogation shows patient states the atrial fibrillation.  Her device was reprogrammed to VVI-CLS.  Patient presents today for a cardiology office visit.  Her recent echocardiogram obtained on 06/17/2018 showed EF 55 to 60%, no significant valvular disease, RV systolic blood pressure was mildly elevated at 42 mmHg.  This echocardiogram was obtained by her pulmonologist who was evaluating her for dyspnea on exertion.  Denies any chest pain.  Given the elevated RV pressure, she was referred to cardiology service for additional evaluation.  Overall her lungs is clear on physical exam.  She does have 2+ ankle edema on physical exam.   Past Medical History:  Diagnosis Date  . A-fib ([place])   . Anemia   . Anxiety   . Arthritis   . Chronic headaches   . Depression   . Fibromyalgia   . GERD (gastroesophageal  reflux disease)   . History of echocardiogram    a. echo 02/2013: normal LV function with an EF of 55-60%, mild MR, mild TR, and PA peak pressure of 40 mmHg  . Hyperlipemia   . IBS (irritable bowel syndrome)   . MVP (mitral valve prolapse)   . Pneumonia   . PONV (postoperative nausea and vomiting)   . S/P cardiac pacemaker procedure, 02/16/13, Bio tronik device 02/19/2013  . Symptomatic bradycardia    s/p Biotronik Evia dual chamber pacemaker implant 02/16/2013 by Dr Rayann Heman  . UTI (lower urinary tract infection)   . Vertigo     Past Surgical History:  Procedure Laterality Date  . BLADDER SUSPENSION    . CESAREAN SECTION    . DILATION AND CURETTAGE OF UTERUS    . PACEMAKER INSERTION  02/16/2013   Biotronik Evia dual chamber pacemaker implanted by Dr Rayann Heman  . PERMANENT PACEMAKER INSERTION N/A 02/16/2013   Procedure: PERMANENT PACEMAKER INSERTION;  Surgeon: Thompson Grayer, MD;  Location: [facility] CATH LAB;  Service: Cardiovascular;  Laterality: N/A;  . TONSILLECTOMY AND ADENOIDECTOMY    . TOTAL ABDOMINAL HYSTERECTOMY    . TYMPANOPLASTY      Current Medications: Outpatient Medications Prior to Visit  Medication Sig Dispense Refill  . ALPRAZolam (XANAX) 1 MG tablet Take 1 tablet (1 mg total) by mouth 2 (two) times daily as needed for anxiety. take 1 tablet by mouth twice a day if needed FOR ANXIETY. 60 tablet  2  . atorvastatin (LIPITOR) 20 MG tablet Take 1 tablet (20 mg total) by mouth daily at 6 PM. 30 tablet 4  . B Complex-C (B-COMPLEX WITH VITAMIN C) tablet Take 1 tablet by mouth daily.    Marland Kitchen buPROPion (WELLBUTRIN XL) 300 MG 24 hr tablet Take 1 tablet (300 mg total) by mouth every morning. 90 tablet 1  . calcium-vitamin D (CALCIUM 500+D) 500-200 MG-UNIT per tablet Take 1 tablet by mouth 2 (two) times daily.     Marland Kitchen co-enzyme Q-10 50 MG capsule Take 50 mg by mouth 2 (two) times daily.    . Creatinine POWD Take by mouth as directed.     Marland Kitchen ELIQUIS 5 MG TABS tablet TAKE 1 TABLET BY MOUTH TWICE A  DAY 180 tablet 1  . HYDROcodone-acetaminophen (NORCO) 10-325 MG tablet Take 1 tablet by mouth every 12 (twelve) hours. 60 tablet 0  . MAGNESIUM MALATE PO Take by mouth. Twice daily    . pantoprazole (PROTONIX) 40 MG tablet Take 1 tablet (40 mg total) by mouth daily. 90 tablet 3  . sertraline (ZOLOFT) 100 MG tablet Take 1 tablet (100 mg total) by mouth daily. 90 tablet 1  . Magnesium Oxide (MAG-200 PO) Take 1 tablet by mouth 2 (two) times daily.      No facility-administered medications prior to visit.      Allergies:   Contrast media [iodinated diagnostic agents]; Ciprofloxacin; Sulfa antibiotics; Cefuroxime axetil; Codeine; Naproxen; Nitrofurantoin; Sulfonamide derivatives; and Sulindac   Social History   Socioeconomic History  . Marital status: Married    Spouse name: Not on file  . Number of children: Not on file  . Years of education: Not on file  . Highest education level: Not on file  Occupational History  . Not on file  Social Needs  . Financial resource strain: Not on file  . Food insecurity:    Worry: Not on file    Inability: Not on file  . Transportation needs:    Medical: Not on file    Non-medical: Not on file  Tobacco Use  . Smoking status: Never Smoker  . Smokeless tobacco: Never Used  Substance and Sexual Activity  . Alcohol use: No    Alcohol/week: 0.0 standard drinks  . Drug use: No  . Sexual activity: Yes  Lifestyle  . Physical activity:    Days per week: Not on file    Minutes per session: Not on file  . Stress: Not on file  Relationships  . Social connections:    Talks on phone: Not on file    Gets together: Not on file    Attends religious service: Not on file    Active member of club or organization: Not on file    Attends meetings of clubs or organizations: Not on file    Relationship status: Not on file  Other Topics Concern  . Not on file  Social History Narrative  . Not on file     Family History:  The patient's family history  includes Alcohol abuse in her father and sister; Diabetes in her sister; Heart disease in her brother and father; Hypertension in her mother.   ROS:   Please see the history of present illness.    ROS All other systems reviewed and are negative.   PHYSICAL EXAM:   VS:  BP (!) 144/90 (BP Location: Left Arm, Patient Position: Sitting)   Pulse 81   Ht 5\' 10"  (1.778 m)   Wt 187 lb  3.2 oz (84.9 kg)   SpO2 99%   BMI 26.86 kg/m    GEN: Well nourished, well developed, in no acute distress  HEENT: normal  Neck: no JVD, carotid bruits, or masses Cardiac: RRR; no murmurs, rubs, or gallops,no edema  Respiratory:  clear to auscultation bilaterally, normal work of breathing GI: soft, nontender, nondistended, + BS MS: no deformity or atrophy  Skin: warm and dry, no rash Neuro:  Alert and Oriented x 3, Strength and sensation are intact Psych: euthymic mood, full affect  Wt Readings from Last 3 Encounters:  07/21/18 187 lb 3.2 oz (84.9 kg)  06/17/18 184 lb 9.6 oz (83.7 kg)  06/09/18 186 lb 9.6 oz (84.6 kg)      Studies/Labs Reviewed:   EKG:  EKG is not ordered today.   Recent Labs: 06/07/2018: ALT 18; BUN 18; Creat 0.65; Hemoglobin 12.6; Platelets 189; Potassium 4.4; Sodium 142; TSH 0.82 06/10/2018: Brain Natriuretic Peptide 132   Lipid Panel    Component Value Date/Time   CHOL 159 03/04/2018 1004   CHOL 233 (H) 03/20/2013 1019   TRIG 69 03/04/2018 1004   TRIG 82 03/20/2013 1019   HDL 64 03/04/2018 1004   HDL 64 03/20/2013 1019   CHOLHDL 2.5 03/04/2018 1004   VLDL 19 03/01/2017 1055   LDLCALC 80 03/04/2018 1004   LDLCALC 153 (H) 03/20/2013 1019    Additional studies/ records that were reviewed today include:   Echo 06/17/2018 LV EF: 55% -   60%  ------------------------------------------------------------------- Indications:      Pulmonary hypertension (I27.2).  Dyspnea on exertion (R06.09).  ------------------------------------------------------------------- History:    PMH:   Syncope and dyspnea.  Atrial flutter.  Mitral valve prolapse.  Risk factors:  Sick sinus syndrome. Hypotension. Bradycardia.  ------------------------------------------------------------------- Study Conclusions  - Left ventricle: The cavity size was normal. Wall thickness was   normal. Systolic function was normal. The estimated ejection   fraction was in the range of 55% to 60%. Wall motion was normal;   there were no regional wall motion abnormalities. Indeterminate   diastolic function. - Aortic valve: Transvalvular velocity was within the normal range.   There was no stenosis. There was no regurgitation. Mean gradient   (S): 4 mm Hg. Valve area (VTI): 2.7 cm^2. Valve area (Vmax): 2.92   cm^2. Valve area (Vmean): 2.48 cm^2. - Mitral valve: Transvalvular velocity was within the normal range.   There was no evidence for stenosis. There was mild regurgitation. - Left atrium: The atrium was moderately dilated. - Right ventricle: The cavity size was normal. Wall thickness was   normal. Pacer wire or catheter noted in right ventricle. Systolic   function was normal. RV systolic pressure (S, est): 42 mm Hg. - Right atrium: The atrium was moderately dilated. - Tricuspid valve: There was mild regurgitation. - Pulmonic valve: There was trivial regurgitation. - Pulmonary arteries: Systolic pressure was mildly to moderately   increased. - Inferior vena cava: The vessel was normal in size. The   respirophasic diameter changes were blunted (< 50%).    ASSESSMENT:    1. Lower extremity edema   2. Mitochondrial myopathy   3. Pacemaker   4. Hyperlipidemia, unspecified hyperlipidemia type   5. Atrial flutter, unspecified type ([place])      PLAN:  In order of problems listed above:  1. Lower extremity edema: She has very mild lower extremity edema on physical exam, I am not entirely sure if this is responsible for her dyspnea on exertion recently.  Her RV pressure was elevated,  I will add a low-dose hydrochlorothiazide as a trial  2. Mitochondrial myopathy: Managed at [facility]  3. Pacemaker: Followed by EP  4. History of atrial flutter: On Eliquis.  No obvious recurrence  5. Hyperlipidemia: Continue statin.    Medication Adjustments/Labs and Tests Ordered: Current medicines are reviewed at length with the patient today.  Concerns regarding medicines are outlined above.  Medication changes, Labs and Tests ordered today are listed in the Patient Instructions below. Patient Instructions  Recap of today's visit Recent echocardiogram showed elevated right heart pressure, physical exam showed ankle edema, I have recommended a trial of HCTZ 12.5 mg daily to help reduce both ankle edema and right heart pressure.  Medication Instructions:  Start Hydrochlorothiazide 12.5 daily. If you need a refill on your cardiac medications before your next appointment, please call your pharmacy.   Lab work: BMET in one week If you have labs (blood work) drawn today and your tests are completely normal, you will receive your results only by: Marland Kitchen MyChart Message (if you have MyChart) OR . A paper copy in the mail If you have any lab test that is abnormal or we need to change your treatment, we will call you to review the results.  Testing/Procedures: None  Follow-Up: At [facility], you and your health needs are our priority.  As part of our continuing mission to provide you with exceptional heart care, we have created designated Provider Care Teams.  These Care Teams include your primary Cardiologist (physician) and Advanced Practice Providers (APPs -  Physician Assistants and Nurse Practitioners) who all work together to provide you with the care you need, when you need it. You will need a follow up appointment in 3 months.  Please call our office 2 months in advance to schedule this appointment.  You may see Dr. Claiborne Billings or one of the following Advanced Practice Providers on your  designated Care Team: [place], [place] . Fabian Sharp, PA-C  Any Other Special Instructions Will Be Listed Below (If Applicable). None      Hilbert Corrigan, [place]  07/23/2018 11:58 PM    [facility] [address], [place], [place]  [address] Phone: [phone]; Fax: [phone]

## 2018-07-24 DIAGNOSIS — R0902 Hypoxemia: Secondary | ICD-10-CM | POA: Diagnosis not present

## 2018-07-27 ENCOUNTER — Ambulatory Visit: Payer: PPO | Admitting: Internal Medicine

## 2018-07-28 ENCOUNTER — Other Ambulatory Visit: Payer: Self-pay | Admitting: *Deleted

## 2018-07-28 DIAGNOSIS — R600 Localized edema: Secondary | ICD-10-CM

## 2018-07-29 LAB — BASIC METABOLIC PANEL
BUN/Creatinine Ratio: 27 (ref 12–28)
BUN: 19 mg/dL (ref 8–27)
CALCIUM: 9.5 mg/dL (ref 8.7–10.3)
CHLORIDE: 101 mmol/L (ref 96–106)
CO2: 23 mmol/L (ref 20–29)
Creatinine, Ser: 0.71 mg/dL (ref 0.57–1.00)
GFR, EST AFRICAN AMERICAN: 98 mL/min/{1.73_m2} (ref >59)
GFR, EST NON AFRICAN AMERICAN: 85 mL/min/{1.73_m2} (ref >59)
Glucose: 106 mg/dL — ABNORMAL HIGH (ref 65–99)
POTASSIUM: 4 mmol/L (ref 3.5–5.2)
SODIUM: 138 mmol/L (ref 134–144)

## 2018-08-02 NOTE — Progress Notes (Signed)
Kidney function and electrolyte ok.

## 2018-08-06 NOTE — Assessment & Plan Note (Signed)
Followed at [facility].  This seems to be fairly stable.

## 2018-08-06 NOTE — Assessment & Plan Note (Addendum)
Never smoker.  Abnormal diffusion presumably related mostly to her cardiac function/mitochondrial myopathy. Plan-echocardiogram ordered now at her suggestion, to be available for next cardiology visit.  Question pulmonary hypertension.

## 2018-08-06 NOTE — Assessment & Plan Note (Signed)
We discussed basic sleep hygiene and advice for conservative management of insomnia with care to return to nighttime sleep and daytime wake.

## 2018-08-17 ENCOUNTER — Ambulatory Visit (INDEPENDENT_AMBULATORY_CARE_PROVIDER_SITE_OTHER): Payer: PPO

## 2018-08-17 DIAGNOSIS — I495 Sick sinus syndrome: Secondary | ICD-10-CM | POA: Diagnosis not present

## 2018-08-18 LAB — CUP PACEART REMOTE DEVICE CHECK
Implantable Lead Implant Date: 20140710
Implantable Lead Implant Date: 20140710
Implantable Lead Location: 753860
Implantable Lead Serial Number: 29334372
Implantable Pulse Generator Implant Date: 20140710
MDC IDC LEAD LOCATION: 753859
MDC IDC LEAD SERIAL: 29426855
MDC IDC SESS DTM: 20200109193825
Pulse Gen Serial Number: 66385898

## 2018-08-18 NOTE — Progress Notes (Signed)
Remote pacemaker transmission.   

## 2018-08-22 ENCOUNTER — Other Ambulatory Visit: Payer: Self-pay | Admitting: Internal Medicine

## 2018-08-22 MED ORDER — HYDROCODONE-ACETAMINOPHEN 10-325 MG PO TABS: 1.0000 | ORAL_TABLET | Freq: Two times a day (BID) | ORAL | 0 refills | Status: DC

## 2018-08-22 NOTE — Telephone Encounter (Addendum)
Patient calling for refill of her Norco 10-325mg .  Last filled 06/14/18.  Her 3 month pain management follow up visit is scheduled for 1/28 @ 3:45 p.m.    Pharmacy:  [facility].  Thank you.    Sent in by e-scribe #[id number] with no refill

## 2018-08-24 DIAGNOSIS — R0902 Hypoxemia: Secondary | ICD-10-CM | POA: Diagnosis not present

## 2018-08-30 ENCOUNTER — Telehealth: Payer: Self-pay

## 2018-08-30 NOTE — Telephone Encounter (Signed)
Left message for patient stating that she has two upcoming appointments with Dr. Claiborne Billings in March and we need to cancel one. Advised patient to contact office to cancel one appt.

## 2018-09-06 ENCOUNTER — Ambulatory Visit (INDEPENDENT_AMBULATORY_CARE_PROVIDER_SITE_OTHER): Payer: PPO | Admitting: Internal Medicine

## 2018-09-06 ENCOUNTER — Encounter: Payer: Self-pay | Admitting: Internal Medicine

## 2018-09-06 VITALS — BP 150/92 | HR 77 | Temp 98.6°F | Ht 70.0 in | Wt 181.0 lb

## 2018-09-06 DIAGNOSIS — G713 Mitochondrial myopathy, not elsewhere classified: Secondary | ICD-10-CM

## 2018-09-06 DIAGNOSIS — M797 Fibromyalgia: Secondary | ICD-10-CM | POA: Diagnosis not present

## 2018-09-06 DIAGNOSIS — F411 Generalized anxiety disorder: Secondary | ICD-10-CM | POA: Diagnosis not present

## 2018-09-06 DIAGNOSIS — F324 Major depressive disorder, single episode, in partial remission: Secondary | ICD-10-CM | POA: Diagnosis not present

## 2018-09-06 DIAGNOSIS — G894 Chronic pain syndrome: Secondary | ICD-10-CM | POA: Diagnosis not present

## 2018-09-06 NOTE — Patient Instructions (Signed)
Continue current medications.  Please call nurse practitioner at psychiatric office for an appointment.  Follow-up in 3 months.

## 2018-09-06 NOTE — Progress Notes (Signed)
   Subjective:    Patient ID: Wendy Rose, female    DOB: 1946-04-03, 73 y.o.   MRN: [id number]  HPI 73 year old Female in today for follow-up on chronic pain management.  She takes Norco  10/325 sparingly for fibromyalgia and chronic musculoskeletal pain.  Longstanding history of fibromyalgia syndrome.  History of mitochondrial myopathy seen at [facility].  History of depression.  Seems sad today and is talking about living alone and losing her husband.  She sees psychiatric nurse practitioner at Dr. Mikki Santee office.  Says she is going to make an appointment to go back and see her soon.  Currently on Zoloft and Wellbutrin.  She saw pulmonologist Dr. Annamaria Boots in July.  She did not desaturate when walking on flat surface but did have issues standing up straight at the end of her walk.  Exercise limits seem to be related to muscle weakness rather than hypoxia.  Given her diagnosis of mitochondrial myopathy that would be a good observation.  I spent 25 minutes with patient today face-to-face talking with her about anxiety depression, lack of motivation, husband's death, issues with cleaning of her house, issues with husband's children, issues with minister who did not come to see him at the hospital.  She did give a urine specimen for drug screen.      Review of Systems says it is easier to walk bent over then to stand up straight.  Ambulates with a cane     Objective:   Physical Exam Chest clear.  Cardiac exam regular rate and rhythm.  Extremities without edema.  Affect is flat.  She is tearful in the office today and appears to be depressed.       Assessment & Plan:  Situational stress-cleaning out of her home and not feeling up to it physically.  We talked about options such as hiring someone to help her with this but she does not seem to be quite ready to do this.  Grief reaction-misses husband. husband  Fibromyalgia syndrome  Mitochondrial myopathy causing generalized muscle  weakness  Anxiety depression-see nurse practitioner [facility] and encouraged her to return soon to see nurse practitioner.  Plan: Okay to refill Norco when patient request a refill.  Drug screen pending.  Follow-up in 3 months.

## 2018-09-09 LAB — PAIN MGMT, PROFILE 8 W/CONF, U
6 Acetylmorphine: NEGATIVE ng/mL (ref <10)
Alcohol Metabolites: NEGATIVE ng/mL (ref <500)
Alphahydroxyalprazolam: 129 ng/mL — ABNORMAL HIGH (ref <25)
Alphahydroxymidazolam: NEGATIVE ng/mL (ref <50)
Alphahydroxytriazolam: NEGATIVE ng/mL (ref <50)
Aminoclonazepam: NEGATIVE ng/mL (ref <25)
Amphetamines: NEGATIVE ng/mL (ref <500)
Benzodiazepines: POSITIVE ng/mL — AB (ref <100)
Buprenorphine, Urine: NEGATIVE ng/mL (ref <5)
CODEINE: NEGATIVE ng/mL (ref <50)
Cocaine Metabolite: NEGATIVE ng/mL (ref <150)
Creatinine: 113.3 mg/dL
Hydrocodone: 510 ng/mL — ABNORMAL HIGH (ref <50)
Hydromorphone: NEGATIVE ng/mL (ref <50)
Hydroxyethylflurazepam: NEGATIVE ng/mL (ref <50)
LORAZEPAM: NEGATIVE ng/mL (ref <50)
MDMA: NEGATIVE ng/mL (ref <500)
Marijuana Metabolite: NEGATIVE ng/mL (ref <20)
Morphine: NEGATIVE ng/mL (ref <50)
Nordiazepam: NEGATIVE ng/mL (ref <50)
Norhydrocodone: 1469 ng/mL — ABNORMAL HIGH (ref <50)
OPIATES: POSITIVE ng/mL — AB (ref <100)
OXYCODONE: NEGATIVE ng/mL (ref <100)
Oxazepam: NEGATIVE ng/mL (ref <50)
Oxidant: NEGATIVE ug/mL (ref <200)
Temazepam: NEGATIVE ng/mL (ref <50)
pH: 6.53 (ref 4.5–9.0)

## 2018-09-24 DIAGNOSIS — R0902 Hypoxemia: Secondary | ICD-10-CM | POA: Diagnosis not present

## 2018-09-27 ENCOUNTER — Other Ambulatory Visit: Payer: Self-pay

## 2018-09-27 MED ORDER — HYDROCODONE-ACETAMINOPHEN 10-325 MG PO TABS: 1.0000 | ORAL_TABLET | Freq: Two times a day (BID) | ORAL | 0 refills | Status: DC

## 2018-09-27 NOTE — Telephone Encounter (Signed)
Patient called to request a refill on [facility], last refill was 08/22/2018. Last OV 09/06/2017.

## 2018-10-10 ENCOUNTER — Encounter: Payer: Self-pay | Admitting: Cardiovascular Disease

## 2018-10-10 ENCOUNTER — Ambulatory Visit: Payer: PPO | Admitting: Cardiovascular Disease

## 2018-10-10 VITALS — BP 126/80 | HR 79 | Ht 70.0 in | Wt 184.4 lb

## 2018-10-10 DIAGNOSIS — G713 Mitochondrial myopathy, not elsewhere classified: Secondary | ICD-10-CM

## 2018-10-10 DIAGNOSIS — Z950 Presence of cardiac pacemaker: Secondary | ICD-10-CM

## 2018-10-10 DIAGNOSIS — I495 Sick sinus syndrome: Secondary | ICD-10-CM

## 2018-10-10 DIAGNOSIS — R609 Edema, unspecified: Secondary | ICD-10-CM | POA: Diagnosis not present

## 2018-10-10 DIAGNOSIS — Z7901 Long term (current) use of anticoagulants: Secondary | ICD-10-CM

## 2018-10-10 DIAGNOSIS — I484 Atypical atrial flutter: Secondary | ICD-10-CM | POA: Diagnosis not present

## 2018-10-10 DIAGNOSIS — E785 Hyperlipidemia, unspecified: Secondary | ICD-10-CM

## 2018-10-10 MED ORDER — APIXABAN 5 MG PO TABS: 5.0000 mg | ORAL_TABLET | Freq: Two times a day (BID) | ORAL | 1 refills | Status: DC

## 2018-10-10 MED ORDER — ATORVASTATIN CALCIUM 20 MG PO TABS: 20.0000 mg | ORAL_TABLET | Freq: Every day | ORAL | 4 refills | Status: DC

## 2018-10-10 MED ORDER — HYDROCHLOROTHIAZIDE 12.5 MG PO TABS: 12.5000 mg | ORAL_TABLET | Freq: Every day | ORAL | 1 refills | Status: DC

## 2018-10-10 NOTE — Patient Instructions (Signed)

## 2018-10-10 NOTE — Progress Notes (Signed)
Patient ID: Wendy Rose, female   DOB: [birth year], 73 y.o.   MRN: [id number]      HPI:  Wendy Rose is a 73 year old female who presents to the office today for 31/2  year cardiology followup evaluation.  I last saw her in June 2016.  Ms Job has a history of mitochondrial myopathy. She also has been told in the past of having mitral valve prolapse. She has episodes of chronic weakness but on 02/14/2013 she developed profound presyncopal spell and  some chest tightness. She was hypotensive and bradycardic with heart rates in the 30s. She was seen initially by me [facility] emergency room and was transported to [facility]. She was started on dopamine. When I had seen her in the hospital questioned whether or not her conduction abnormalities were related to the possibility of Kearns-Sayre syndrome variant of mitochondrial myopathy. She has had some difficulty with vision but denied any episodes of ptosis. An echo Doppler study showed an ejection fraction of 55-60% without wall motion abnormalities. She did have a mildly calcified aortic annulus. She had mild MR. There was mild TR, and mild pulmonary hypertension with estimated RV systolic pressure at 40 mm. She ultimately required insertion of a permanent pacemaker which was done by Dr. Rayann Heman on 02/16/2013 and  a Biotronik  pacemaker was inserted. She has felt improved with reference to any further episodes of presyncope.  Family history is notable in that essentially all of her family members died with heart disease before the age 57. Most had had cardiac events in their 32s. Has noticed shortness of breath with activity. She does note weakness. She does note vague chest sensation. She presents for followup evaluation. She has been followed by Dr. Tommie Ard Baxley  who has monitored her lipids and has not been on lipid-lowering therapy. When I recently saw her because of vague symptoms and shortness of breath and her strong family history for  coronary artery disease I recommended that she undergo a nuclear perfusion study to assess for potential ischemia. I also recommended advanced lipid testing. A nuclear study was done on 03/23/2013 and was essentially normal. There was a minimal apical defect with paradoxical septal motion with apical dyssynergy most likely related to her pacemaker with overall normal systolic function and without  evidence for ischemia. Advanced lipid testing revealed a total cholesterol 233 triglycerides 82 HDL 64 LDL 153. She has significant increased LDL particle number of 1937 Nanomoles per liter and had 627 small LDL particles. Insulin resistance score was excellent. As result, she was notified in atorvastatin 20 mg was initiated along with coenzyme Q 10.  Since I  saw her in 2015 she had been seen in the EP clinic for atrial flutter and sick sinus syndrome.  She has intact pacemaker function.  She is on chronic anticoagulation with eliquis.  She saw Roderic Palau in March 2016.  She has had some issues with low blood pressure.  She was evaluated in the emergency room on 01/18/2015.  The patient states prior to going to the emergency room, her blood pressure had dropped to 66/56, but on presentation, her blood pressure was 106/62.  Troponins were negative.  She was mildly dehydrated and her BUN had risen from 14-22 and creatinine 0.53-0.81 from one year ago.  I last saw her in June 2016 at which time she felt improved.  She was having occasional reflux symptoms and also fibromyalgia.  She was continuing to follow with Dr. Rayann Heman for  her paroxysmal atrial flutter.    Since I last saw her, she has been evaluated yearly by Dr. Rayann Heman.  Due to symptoms of dyspnea on exertion, she had undergone an echo Doppler study in November 2019 which showed an EF of 55 to 60% without significant valvular disease.  RV systolic pressure was increased at 42 mm.  She saw Almyra Deforest, PA in follow-up and during that evaluation she had 2+ lower  extremity edema.  He added low-dose hydrochlorothiazide.  Her mitochondrial myopathy continues to be managed at [facility].  She has continued to be on atorvastatin for hyperlipidemia.  She is on Eliquis for anticoagulation.  GERD is treated with pantoprazole.  She admits to continued difficulty with her back and legs and she is bending more using a cane to walk.  Since I last saw her husband had died in 10-11-16 and she now lives alone.  She presents for reevaluation.  Past Medical History:  Diagnosis Date  . A-fib ([place])   . Anemia   . Anxiety   . Arthritis   . Chronic headaches   . Depression   . Fibromyalgia   . GERD (gastroesophageal reflux disease)   . History of echocardiogram    a. echo 02/2013: normal LV function with an EF of 55-60%, mild MR, mild TR, and PA peak pressure of 40 mmHg  . Hyperlipemia   . IBS (irritable bowel syndrome)   . MVP (mitral valve prolapse)   . Pneumonia   . PONV (postoperative nausea and vomiting)   . S/P cardiac pacemaker procedure, 02/16/13, Bio tronik device 02/19/2013  . Symptomatic bradycardia    s/p Biotronik Evia dual chamber pacemaker implant 02/16/2013 by Dr Rayann Heman  . UTI (lower urinary tract infection)   . Vertigo     Past Surgical History:  Procedure Laterality Date  . BLADDER SUSPENSION    . CESAREAN SECTION    . DILATION AND CURETTAGE OF UTERUS    . PACEMAKER INSERTION  02/16/2013   Biotronik Evia dual chamber pacemaker implanted by Dr Rayann Heman  . PERMANENT PACEMAKER INSERTION N/A 02/16/2013   Procedure: PERMANENT PACEMAKER INSERTION;  Surgeon: Thompson Grayer, MD;  Location: [facility] CATH LAB;  Service: Cardiovascular;  Laterality: N/A;  . TONSILLECTOMY AND ADENOIDECTOMY    . TOTAL ABDOMINAL HYSTERECTOMY    . TYMPANOPLASTY      Allergies  Allergen Reactions  . Contrast Media [Iodinated Diagnostic Agents] Shortness Of Breath    "difficulty breathing"  . Ciprofloxacin Itching    Pt stated itching   . Sulfa Antibiotics     Other reaction(s): Unknown    . Cefuroxime Axetil Rash    REACTION: Rash  . Codeine Other (See Comments)    REACTION: Reaction not known  . Naproxen Other (See Comments)    REACTION: Reaction not known  . Nitrofurantoin Rash    REACTION: Increase LFT's  . Sulfonamide Derivatives Other (See Comments)    REACTION: Reaction not known  . Sulindac Other (See Comments)    REACTION: Reaction not known    Current Outpatient Medications  Medication Sig Dispense Refill  . ALPRAZolam (XANAX) 1 MG tablet Take 1 tablet (1 mg total) by mouth 2 (two) times daily as needed for anxiety. take 1 tablet by mouth twice a day if needed FOR ANXIETY. 60 tablet 2  . apixaban (ELIQUIS) 5 MG TABS tablet Take 1 tablet (5 mg total) by mouth 2 (two) times daily. 180 tablet 1  . atorvastatin (LIPITOR) 20 MG tablet Take 1 tablet (  20 mg total) by mouth daily at 6 PM. 30 tablet 4  . B Complex-C (B-COMPLEX WITH VITAMIN C) tablet Take 1 tablet by mouth daily.    Marland Kitchen buPROPion (WELLBUTRIN XL) 300 MG 24 hr tablet Take 1 tablet (300 mg total) by mouth every morning. 90 tablet 1  . calcium-vitamin D (CALCIUM 500+D) 500-200 MG-UNIT per tablet Take 1 tablet by mouth 2 (two) times daily.     Marland Kitchen co-enzyme Q-10 50 MG capsule Take 50 mg by mouth 2 (two) times daily.    . Creatinine POWD Take by mouth as directed.     . hydrochlorothiazide (HYDRODIURIL) 12.5 MG tablet Take 1 tablet (12.5 mg total) by mouth daily. 90 tablet 1  . HYDROcodone-acetaminophen (NORCO) 10-325 MG tablet Take 1 tablet by mouth every 12 (twelve) hours. 60 tablet 0  . MAGNESIUM MALATE PO Take by mouth. Twice daily    . Magnesium Oxide (MAG-200 PO) Take 1 tablet by mouth 2 (two) times daily.     . pantoprazole (PROTONIX) 40 MG tablet Take 1 tablet (40 mg total) by mouth daily. 90 tablet 3  . sertraline (ZOLOFT) 100 MG tablet Take 1 tablet (100 mg total) by mouth daily. 90 tablet 1   No current facility-administered medications for this visit.     Social History   Socioeconomic History   . Marital status: Married    Spouse name: Not on file  . Number of children: Not on file  . Years of education: Not on file  . Highest education level: Not on file  Occupational History  . Not on file  Social Needs  . Financial resource strain: Not on file  . Food insecurity:    Worry: Not on file    Inability: Not on file  . Transportation needs:    Medical: Not on file    Non-medical: Not on file  Tobacco Use  . Smoking status: Never Smoker  . Smokeless tobacco: Never Used  Substance and Sexual Activity  . Alcohol use: No    Alcohol/week: 0.0 standard drinks  . Drug use: No  . Sexual activity: Yes  Lifestyle  . Physical activity:    Days per week: Not on file    Minutes per session: Not on file  . Stress: Not on file  Relationships  . Social connections:    Talks on phone: Not on file    Gets together: Not on file    Attends religious service: Not on file    Active member of club or organization: Not on file    Attends meetings of clubs or organizations: Not on file    Relationship status: Not on file  . Intimate partner violence:    Fear of current or ex partner: Not on file    Emotionally abused: Not on file    Physically abused: Not on file    Forced sexual activity: Not on file  Other Topics Concern  . Not on file  Social History Narrative  . Not on file   ROS General: Negative; No fevers, chills, or night sweats;  HEENT: Negative; No changes in vision or hearing, sinus congestion, difficulty swallowing Pulmonary: Negative; No cough, wheezing, shortness of breath, hemoptysis Cardiovascular:  See HPI GI: Positive for GERD GU: Negative; No dysuria, hematuria, or difficulty voiding Musculoskeletal: Negative; no myalgias, joint pain, or weakness Hematologic/Oncology: Negative; no easy bruising, bleeding Endocrine: Negative; no heat/cold intolerance; no diabetes Rheumatologic: Positive for fibromyalgia;' mitochondrial myopathy Neuro: Negative; no changes in  balance, headaches Skin: Negative; No rashes or skin lesions Psychiatric: Negative; No behavioral problems, depression Sleep: Negative; No snoring, daytime sleepiness, hypersomnolence, bruxism, restless legs, hypnogognic hallucinations, no cataplexy Other comprehensive 14 point system review is negative.   PE BP 126/80   Pulse 79   Ht '5\' 10"'  (1.778 m)   Wt 184 lb 6.4 oz (83.6 kg)   BMI 26.46 kg/m    Repeat blood pressure by me was 140/70, supine, and was 132/72 standing.  Wt Readings from Last 3 Encounters:  10/10/18 184 lb 6.4 oz (83.6 kg)  09/06/18 181 lb (82.1 kg)  07/21/18 187 lb 3.2 oz (84.9 kg)   General: Alert, oriented, no distress.  Skin: normal turgor, no rashes, warm and dry HEENT: Normocephalic, atraumatic. Pupils equal round and reactive to light; sclera anicteric; extraocular muscles intact;  Nose without nasal septal hypertrophy Mouth/Parynx benign; Mallinpatti scale 3 Neck: No JVD, no carotid bruits; normal carotid upstroke Lungs: clear to ausculatation and percussion; no wheezing or rales Chest wall: without tenderness to palpitation Heart: PMI not displaced, RRR, s1 s2 normal, 1/6 systolic murmur, no diastolic murmur, no rubs, gallops, thrills, or heaves Abdomen: soft, nontender; no hepatosplenomehaly, BS+; abdominal aorta nontender and not dilated by palpation. Back: no CVA tenderness Pulses 2+ Musculoskeletal: full range of motion, normal strength, no joint deformities Extremities: Dependent rubor to her feet bilaterally; trace edema; no clubbing cyanosis, Homan's sign negative  Neurologic: grossly nonfocal; Cranial nerves grossly wnl Psychologic: Normal mood and affect   ECG (independently read by me): Ventricular paced at 79 bpm.  June 2016 ECG (independently read by me): AV paced rhythm with 100% capture.  Heart rate 73 bpm.  March 2015 ECG (independently read by me): AV paced rhythm at 79 beats per minute.  LABS: BMP Latest Ref Rng & Units  07/28/2018 06/07/2018 03/04/2018  Glucose 65 - 99 mg/dL 106(H) 95 90  BUN 8 - 27 mg/dL '19 18 17  ' Creatinine 0.57 - 1.00 mg/dL 0.71 0.65 0.86  BUN/Creat Ratio 12 - 28 27 NOT APPLICABLE NOT APPLICABLE  Sodium 615 - 144 mmol/L 138 142 139  Potassium 3.5 - 5.2 mmol/L 4.0 4.4 5.0  Chloride 96 - 106 mmol/L 101 108 105  CO2 20 - 29 mmol/L '23 26 28  ' Calcium 8.7 - 10.3 mg/dL 9.5 9.4 9.3   Hepatic Function Latest Ref Rng & Units 06/07/2018 03/04/2018 05/06/2017  Total Protein 6.1 - 8.1 g/dL 6.2 6.1 5.9(L)  Albumin 3.5 - 5.0 g/dL - - 3.7  AST 10 - 35 U/L 20 19 39  ALT 6 - 29 U/L '18 21 27  ' Alk Phosphatase 38 - 126 U/L - - 68  Total Bilirubin 0.2 - 1.2 mg/dL 0.5 0.5 0.5  Bilirubin, Direct 0.0 - 0.3 mg/dL - - -   CBC Latest Ref Rng & Units 06/07/2018 03/04/2018 12/07/2017  WBC 3.8 - 10.8 Thousand/uL 6.7 6.5 8.9  Hemoglobin 11.7 - 15.5 g/dL 12.6 12.9 12.7  Hematocrit 35.0 - 45.0 % 37.9 38.4 37.5  Platelets 140 - 400 Thousand/uL 189 278 208   Lab Results  Component Value Date   MCV 82.8 06/07/2018   MCV 83.3 03/04/2018   MCV 83.0 12/07/2017   Lab Results  Component Value Date   TSH 0.82 06/07/2018   BNP (last 3 results) Recent Labs    06/07/18 1558 06/10/18 1151  BNP CANCELED 132*    ProBNP (last 3 results) No results for input(s): PROBNP in the last 8760 hours.  Lipid Panel  Component Value Date/Time   CHOL 159 03/04/2018 1004   CHOL 233 (H) 03/20/2013 1019   TRIG 69 03/04/2018 1004   TRIG 82 03/20/2013 1019   HDL 64 03/04/2018 1004   HDL 64 03/20/2013 1019   CHOLHDL 2.5 03/04/2018 1004   VLDL 19 03/01/2017 1055   LDLCALC 80 03/04/2018 1004   LDLCALC 153 (H) 03/20/2013 1019    RADIOLOGY: No results found.   IMPRESSION:  1. History of atypical atrial flutter ([place])   2. Sick sinus syndrome ([place])   3. Pacemaker   4. Chronic anticoagulation   5. Hyperlipidemia, unspecified hyperlipidemia type   6. Mitochondrial myopathy   7. Edema, unspecified type       ASSESSMENT AND PLAN: Ms. Wendy Rose is a 73 year old female with a diagnosis of mitochondrial myopathy and underwent permanent  pacemaker insertion  for bradycardia induced presyncope.  She also has very strong family history for coronary artery disease with essentially all family members having heart disease before age 52. She has experienced episodes of shortness of breath with activity and has noticed some vague chest sensations. A previous nuclear perfusion study in 2014 argued against significant ischemia. There was mild apical septal defect related to her conduction abnormality.  Her blood pressure today is stable and on repeat by me was 126/78.  Her ECG reveals a ventricular paced rhythm with a controlled rate in the 70s.  She has dependent rubor in her lower extremities which improves when her legs are elevated.  Suspect there may be some underlying vascular venous insufficiency.  She continues to be bothered by fibromyalgia as well as weakness in her back and legs making it more difficult for her to walk and she now stoops more foward  than she had in the past.  She was recently seen by her primary physician Dr. Renold Genta and gets pain medications.  Her last lipid studies in July 2019 showed an LDL cholesterol at 80.  He continues to be on atorvastatin 20 mg daily.  She is anticoagulated on Eliquis without bleeding.  I will see her in 1 year for reevaluation or sooner if problems arise.  Time spent: 25 minutes  Troy Sine, MD, [facility]  10/13/2018 2:36 PM

## 2018-10-13 ENCOUNTER — Encounter: Payer: Self-pay | Admitting: Cardiovascular Disease

## 2018-10-23 DIAGNOSIS — R0902 Hypoxemia: Secondary | ICD-10-CM | POA: Diagnosis not present

## 2018-10-26 ENCOUNTER — Telehealth: Payer: Self-pay | Admitting: Internal Medicine

## 2018-10-26 ENCOUNTER — Encounter: Payer: Self-pay | Admitting: Internal Medicine

## 2018-10-26 DIAGNOSIS — G44321 Chronic post-traumatic headache, intractable: Secondary | ICD-10-CM | POA: Diagnosis not present
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
## 2018-10-27 ENCOUNTER — Telehealth: Payer: Self-pay

## 2018-10-27 ENCOUNTER — Ambulatory Visit: Payer: PPO | Admitting: Cardiovascular Disease

## 2018-10-27 NOTE — Telephone Encounter (Signed)
Called to check on patient she said her head is a little bit sore she is okay right now her niece is checking on her every hour I advised patient to call if she starts noticing anything abnormal. Patient verbalized understanding.

## 2018-11-07 ENCOUNTER — Other Ambulatory Visit: Payer: Self-pay | Admitting: Internal Medicine

## 2018-11-16 ENCOUNTER — Other Ambulatory Visit: Payer: Self-pay

## 2018-11-16 ENCOUNTER — Encounter: Payer: PPO | Admitting: *Deleted

## 2018-11-23 DIAGNOSIS — R0902 Hypoxemia: Secondary | ICD-10-CM | POA: Diagnosis not present

## 2018-11-24 ENCOUNTER — Ambulatory Visit (INDEPENDENT_AMBULATORY_CARE_PROVIDER_SITE_OTHER): Payer: PPO | Admitting: Internal Medicine

## 2018-11-24 DIAGNOSIS — F411 Generalized anxiety disorder: Secondary | ICD-10-CM | POA: Diagnosis not present

## 2018-11-24 DIAGNOSIS — G713 Mitochondrial myopathy, not elsewhere classified: Secondary | ICD-10-CM

## 2018-11-24 DIAGNOSIS — R531 Weakness: Secondary | ICD-10-CM

## 2018-11-24 DIAGNOSIS — F5102 Adjustment insomnia: Secondary | ICD-10-CM | POA: Diagnosis not present

## 2018-11-24 DIAGNOSIS — Z7901 Long term (current) use of anticoagulants: Secondary | ICD-10-CM | POA: Diagnosis not present

## 2018-11-24 DIAGNOSIS — G894 Chronic pain syndrome: Secondary | ICD-10-CM

## 2018-11-24 DIAGNOSIS — F324 Major depressive disorder, single episode, in partial remission: Secondary | ICD-10-CM

## 2018-11-24 DIAGNOSIS — M797 Fibromyalgia: Secondary | ICD-10-CM | POA: Diagnosis not present

## 2018-11-24 MED ORDER — HYDROCODONE-ACETAMINOPHEN 10-325 MG PO TABS: 1.0000 | ORAL_TABLET | Freq: Two times a day (BID) | ORAL | 0 refills | Status: DC
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
## 2018-11-30 ENCOUNTER — Encounter: Payer: Self-pay | Admitting: Internal Medicine

## 2018-11-30 NOTE — Patient Instructions (Signed)
It was a pleasure to see you via virtual visit today.  Continue current medications.  Physical exam and Medicare wellness visit due mid July.

## 2018-12-08 ENCOUNTER — Ambulatory Visit: Payer: PPO | Admitting: Internal Medicine

## 2018-12-23 DIAGNOSIS — R0902 Hypoxemia: Secondary | ICD-10-CM | POA: Diagnosis not present

## 2019-01-03 ENCOUNTER — Other Ambulatory Visit: Payer: Self-pay | Admitting: Internal Medicine

## 2019-01-03 MED ORDER — HYDROCODONE-ACETAMINOPHEN 10-325 MG PO TABS: 1.0000 | ORAL_TABLET | Freq: Two times a day (BID) | ORAL | 0 refills | Status: DC

## 2019-01-03 NOTE — Telephone Encounter (Signed)
Last refill 11/24/18

## 2019-01-03 NOTE — Telephone Encounter (Signed)
Wendy Rose [phone]  [place]  HYDROcodone-acetaminophen [facility]) 10-325 MG tablet    [name] called to say she needs a refill on above medication

## 2019-01-05 ENCOUNTER — Ambulatory Visit: Payer: PPO | Admitting: Internal Medicine

## 2019-01-05 ENCOUNTER — Encounter: Payer: PPO | Admitting: *Deleted

## 2019-01-16 ENCOUNTER — Ambulatory Visit: Payer: PPO | Admitting: Psychiatry

## 2019-01-16 ENCOUNTER — Ambulatory Visit (INDEPENDENT_AMBULATORY_CARE_PROVIDER_SITE_OTHER): Payer: PPO | Admitting: *Deleted

## 2019-01-16 DIAGNOSIS — I442 Atrioventricular block, complete: Secondary | ICD-10-CM | POA: Diagnosis not present

## 2019-01-16 DIAGNOSIS — I495 Sick sinus syndrome: Secondary | ICD-10-CM

## 2019-01-23 ENCOUNTER — Encounter: Payer: Self-pay | Admitting: Psychiatry

## 2019-01-23 ENCOUNTER — Ambulatory Visit: Payer: PPO | Admitting: Psychiatry

## 2019-01-23 ENCOUNTER — Other Ambulatory Visit: Payer: Self-pay

## 2019-01-23 DIAGNOSIS — F5101 Primary insomnia: Secondary | ICD-10-CM | POA: Diagnosis not present

## 2019-01-23 DIAGNOSIS — F32A Depression, unspecified: Secondary | ICD-10-CM

## 2019-01-23 DIAGNOSIS — F419 Anxiety disorder, unspecified: Secondary | ICD-10-CM

## 2019-01-23 DIAGNOSIS — R0902 Hypoxemia: Secondary | ICD-10-CM | POA: Diagnosis not present

## 2019-01-23 DIAGNOSIS — F329 Major depressive disorder, single episode, unspecified: Secondary | ICD-10-CM | POA: Diagnosis not present

## 2019-01-23 LAB — CUP PACEART REMOTE DEVICE CHECK
Date Time Interrogation Session: 20200615121352
Implantable Lead Implant Date: 20140710
Implantable Lead Implant Date: 20140710
Implantable Lead Location: 753859
Implantable Lead Location: 753860
Implantable Lead Model: 346
Implantable Lead Model: 346
Implantable Lead Serial Number: 29334372
Implantable Lead Serial Number: 29426855
Implantable Pulse Generator Implant Date: 20140710
Pulse Gen Serial Number: 66385898

## 2019-01-23 MED ORDER — ALPRAZOLAM 1 MG PO TABS: 1.0000 mg | ORAL_TABLET | Freq: Two times a day (BID) | ORAL | 2 refills | Status: DC | PRN

## 2019-01-23 MED ORDER — BUPROPION HCL ER (XL) 300 MG PO TB24: 300.0000 mg | ORAL_TABLET | ORAL | 1 refills | Status: DC

## 2019-01-23 MED ORDER — SERTRALINE HCL 100 MG PO TABS: 150.0000 mg | ORAL_TABLET | Freq: Every day | ORAL | 1 refills | Status: DC

## 2019-01-23 NOTE — Progress Notes (Signed)
Wendy Rose [id number] 1946/07/07 73 y.o.  Subjective:   Patient ID:  Wendy Rose is a 73 y.o. (DOB 1945-11-12) female.  Chief Complaint:  Chief Complaint  Patient presents with  . Anxiety  . Other    Irritability.     HPI Wendy Rose presents to the office today for follow-up of mood,anxiety and insomnia. She reports that she has been having severe irritability. She reports that she is easily angered and has been cursing, which is out of character for her. Reports that she is easily frustrated with herself. She reports that her niece also noticed that Wendy Rose was more irritable. Denies any recent crying episodes. Denies persistent sad mood. Reports that she continues to have some anxiety. She reports that in the past she had obsessive thoughts and compulsions and now "things are piled everywhere." She reports that she is feeling pressured to make preparations for the future. She reports feeling overwhelmed and "don't know where to start." Reports that she is easily fatigued and restricted due to physical limitations. She reports that anxious thoughts periodically interfere with sleep initiation. She reports that she had 2 sleepless nights last week and then slept excessively once she was able to sleep. She reports overall her sleep has been somewhat better. She reports that if she takes Xanax 1/2 tab and 1/2 tab of a Melatonin she may or may not sleep. Reports that if she takes Xanax 1 mg po QHS she sleeps very well. She reports that her appetite has been good. Reports that when she has difficulty sleeping she will eat throughout the night. Energy and motivation have been low. She reports that she is having difficulty with memory and is occasionally getting her words "mixed up."   Denies SI.   She reports that she is thinking about moving out of her house and moving to [place], [place] to be closer to her niece who is her primary support. She reports that she has noticed some gradual decline in her  physical health and would like to make some preparations to move and get her affairs in order. Reports that she is having to make multiple home repairs.   Past Medication Trials: Abilify- wt gain Zyprexa- excessive sedation Seroquel- excessive sedation Prozac- took for years Sertraline Wellbutrin XL Xanax Ativan  Review of Systems:  Review of Systems  HENT: Positive for hearing loss.   Eyes: Positive for visual disturbance.  Cardiovascular: Positive for leg swelling.  Musculoskeletal: Positive for gait problem.  Neurological: Negative for tremors.  Psychiatric/Behavioral:       Please refer to HPI    Medications: I have reviewed the patient's current medications.  Current Outpatient Medications  Medication Sig Dispense Refill  . apixaban (ELIQUIS) 5 MG TABS tablet Take 1 tablet (5 mg total) by mouth 2 (two) times daily. 180 tablet 1  . atorvastatin (LIPITOR) 20 MG tablet Take 1 tablet (20 mg total) by mouth daily at 6 PM. 30 tablet 4  . B Complex-C (B-COMPLEX WITH VITAMIN C) tablet Take 1 tablet by mouth daily.    . calcium-vitamin D (CALCIUM 500+D) 500-200 MG-UNIT per tablet Take 1 tablet by mouth 2 (two) times daily.     Marland Kitchen co-enzyme Q-10 50 MG capsule Take 50 mg by mouth 2 (two) times daily.    . Creatinine POWD Take by mouth as directed.     . hydrochlorothiazide (HYDRODIURIL) 12.5 MG tablet Take 12.5 mg by mouth daily.    Marland Kitchen HYDROcodone-acetaminophen (NORCO) 10-325 MG tablet  Take 1 tablet by mouth every 12 (twelve) hours. 60 tablet 0  . MELATONIN PO Take by mouth.    . pantoprazole (PROTONIX) 40 MG tablet TAKE 1 TABLET BY MOUTH ONCE DAILY 90 tablet 3  . [START ON 01/27/2019] ALPRAZolam (XANAX) 1 MG tablet Take 1 tablet (1 mg total) by mouth 2 (two) times daily as needed for up to 30 days for anxiety. take 1 tablet by mouth twice a day if needed FOR ANXIETY. 60 tablet 2  . buPROPion (WELLBUTRIN XL) 300 MG 24 hr tablet Take 1 tablet (300 mg total) by mouth every morning. 90  tablet 1  . MAGNESIUM MALATE PO Take by mouth. Twice daily    . Magnesium Oxide (MAG-200 PO) Take 1 tablet by mouth 2 (two) times daily.     . sertraline (ZOLOFT) 100 MG tablet Take 1.5 tablets (150 mg total) by mouth daily for 30 days. 90 tablet 1   No current facility-administered medications for this visit.     Medication Side Effects: None  Allergies:  Allergies  Allergen Reactions  . Contrast Media [Iodinated Diagnostic Agents] Shortness Of Breath    "difficulty breathing"  . Ciprofloxacin Itching    Wendy Rose stated itching   . Sulfa Antibiotics     Other reaction(s): Unknown  . Cefuroxime Axetil Rash    REACTION: Rash  . Codeine Other (See Comments)    REACTION: Reaction not known  . Naproxen Other (See Comments)    REACTION: Reaction not known  . Nitrofurantoin Rash    REACTION: Increase LFT's  . Sulfonamide Derivatives Other (See Comments)    REACTION: Reaction not known  . Sulindac Other (See Comments)    REACTION: Reaction not known    Past Medical History:  Diagnosis Date  . A-fib ([place])   . Anemia   . Anxiety   . Arthritis   . Chronic headaches   . Depression   . Fibromyalgia   . GERD (gastroesophageal reflux disease)   . History of echocardiogram    a. echo 02/2013: normal LV function with an EF of 55-60%, mild MR, mild TR, and PA peak pressure of 40 mmHg  . Hyperlipemia   . IBS (irritable bowel syndrome)   . MVP (mitral valve prolapse)   . Pneumonia   . PONV (postoperative nausea and vomiting)   . S/P cardiac pacemaker procedure, 02/16/13, Bio tronik device 02/19/2013  . Symptomatic bradycardia    s/p Biotronik Evia dual chamber pacemaker implant 02/16/2013 by Dr Rayann Heman  . UTI (lower urinary tract infection)   . Vertigo     Family History  Problem Relation Age of Onset  . Hypertension Mother   . Heart disease Father   . Alcohol abuse Father   . Diabetes Sister   . Alcohol abuse Sister   . Heart disease Brother   . Colon cancer Neg Hx   .  Esophageal cancer Neg Hx   . Stomach cancer Neg Hx   . Rectal cancer Neg Hx     Social History   Socioeconomic History  . Marital status: Married    Spouse name: Not on file  . Number of children: Not on file  . Years of education: Not on file  . Highest education level: Not on file  Occupational History  . Not on file  Social Needs  . Financial resource strain: Not on file  . Food insecurity    Worry: Not on file    Inability: Not on file  .  Transportation needs    Medical: Not on file    Non-medical: Not on file  Tobacco Use  . Smoking status: Never Smoker  . Smokeless tobacco: Never Used  Substance and Sexual Activity  . Alcohol use: No    Alcohol/week: 0.0 standard drinks  . Drug use: No  . Sexual activity: Yes  Lifestyle  . Physical activity    Days per week: Not on file    Minutes per session: Not on file  . Stress: Not on file  Relationships  . Social Herbalist on phone: Not on file    Gets together: Not on file    Attends religious service: Not on file    Active member of club or organization: Not on file    Attends meetings of clubs or organizations: Not on file    Relationship status: Not on file  . Intimate partner violence    Fear of current or ex partner: Not on file    Emotionally abused: Not on file    Physically abused: Not on file    Forced sexual activity: Not on file  Other Topics Concern  . Not on file  Social History Narrative  . Not on file    Past Medical History, Surgical history, Social history, and Family history were reviewed and updated as appropriate.   Please see review of systems for further details on the patient's review from today.   Objective:   Physical Exam:  Wt 184 lb (83.5 kg)   BMI 26.40 kg/m   Physical Exam Constitutional:      General: She is not in acute distress.    Appearance: She is well-developed.  Musculoskeletal:     Comments: Uses rolator  Neurological:     Mental Status: She is alert  and oriented to person, place, and time.     Coordination: Coordination normal.  Psychiatric:        Attention and Perception: Attention and perception normal. She does not perceive auditory or visual hallucinations.        Mood and Affect: Mood is anxious and depressed. Affect is not labile, blunt, angry or inappropriate.        Speech: Speech normal.        Behavior: Behavior normal.        Thought Content: Thought content normal. Thought content does not include homicidal or suicidal ideation. Thought content does not include homicidal or suicidal plan.        Cognition and Memory: Cognition and memory normal.        Judgment: Judgment normal.     Comments: Insight intact. No delusions.      Lab Review:     Component Value Date/Time   NA 138 07/28/2018 1437   K 4.0 07/28/2018 1437   CL 101 07/28/2018 1437   CO2 23 07/28/2018 1437   GLUCOSE 106 (H) 07/28/2018 1437   GLUCOSE 95 06/07/2018 1558   BUN 19 07/28/2018 1437   CREATININE 0.71 07/28/2018 1437   CREATININE 0.65 06/07/2018 1558   CALCIUM 9.5 07/28/2018 1437   PROT 6.2 06/07/2018 1558   ALBUMIN 3.7 05/06/2017 2041   AST 20 06/07/2018 1558   ALT 18 06/07/2018 1558   ALKPHOS 68 05/06/2017 2041   BILITOT 0.5 06/07/2018 1558   GFRNONAA 85 07/28/2018 1437   GFRNONAA 67 03/04/2018 1004   GFRAA 98 07/28/2018 1437   GFRAA 78 03/04/2018 1004       Component Value Date/Time  WBC 6.7 06/07/2018 1558   RBC 4.58 06/07/2018 1558   HGB 12.6 06/07/2018 1558   HCT 37.9 06/07/2018 1558   PLT 189 06/07/2018 1558   MCV 82.8 06/07/2018 1558   MCH 27.5 06/07/2018 1558   MCHC 33.2 06/07/2018 1558   RDW 13.9 06/07/2018 1558   LYMPHSABS 1,755 06/07/2018 1558   MONOABS 0.9 05/06/2017 2041   EOSABS 161 06/07/2018 1558   BASOSABS 40 06/07/2018 1558    No results found for: POCLITH, LITHIUM   No results found for: PHENYTOIN, PHENOBARB, VALPROATE, CBMZ   .res Assessment: Plan:   Discussed potential benefits, risks, and side  effects of increasing Sertraline to 150 mg po qd to target anxiety and irritability. Wendy Rose agrees to increase in Sertraline. May consider switching back to Prozac if no improvement in s/s with increase in Sertraline.  Continue Alprazolam prn anxiety or insomnia.  Wendy Rose to f/u in 4 weeks or sooner if clinically indicated.  Patient advised to contact office with any questions, adverse effects, or acute worsening in signs and symptoms.  Wendy Rose was seen today for anxiety and other.  Diagnoses and all orders for this visit:  Depression, unspecified depression type -     sertraline (ZOLOFT) 100 MG tablet; Take 1.5 tablets (150 mg total) by mouth daily for 30 days. -     buPROPion (WELLBUTRIN XL) 300 MG 24 hr tablet; Take 1 tablet (300 mg total) by mouth every morning.  Anxiety -     sertraline (ZOLOFT) 100 MG tablet; Take 1.5 tablets (150 mg total) by mouth daily for 30 days. -     ALPRAZolam (XANAX) 1 MG tablet; Take 1 tablet (1 mg total) by mouth 2 (two) times daily as needed for up to 30 days for anxiety. take 1 tablet by mouth twice a day if needed FOR ANXIETY.  Primary insomnia -     ALPRAZolam (XANAX) 1 MG tablet; Take 1 tablet (1 mg total) by mouth 2 (two) times daily as needed for up to 30 days for anxiety. take 1 tablet by mouth twice a day if needed FOR ANXIETY.     Please see After Visit Summary for patient specific instructions.  Future Appointments  Date Time Provider [place]  02/13/2019  2:15 PM Hayden Pedro, MD [facility] None  02/14/2019  1:30 PM Thayer Headings, PMHNP CP[facility] None  03/07/2019  9:20 AM Renold Genta Cresenciano Lick, MD [facility] MJB  03/10/2019  3:00 PM Elby Showers, MD [facility] [place]  04/10/2019  3:30 PM Deneise Lever, MD [facility] None    No orders of the defined types were placed in this encounter.   -------------------------------

## 2019-01-24 DIAGNOSIS — Z961 Presence of intraocular lens: Secondary | ICD-10-CM | POA: Diagnosis not present

## 2019-01-24 DIAGNOSIS — H35033 Hypertensive retinopathy, bilateral: Secondary | ICD-10-CM | POA: Diagnosis not present

## 2019-01-24 DIAGNOSIS — H43393 Other vitreous opacities, bilateral: Secondary | ICD-10-CM | POA: Diagnosis not present

## 2019-01-24 DIAGNOSIS — H35013 Changes in retinal vascular appearance, bilateral: Secondary | ICD-10-CM | POA: Diagnosis not present

## 2019-01-24 NOTE — Progress Notes (Signed)
Remote pacemaker transmission.   

## 2019-02-07 ENCOUNTER — Other Ambulatory Visit: Payer: Self-pay

## 2019-02-07 MED ORDER — HYDROCODONE-ACETAMINOPHEN 10-325 MG PO TABS: 1.0000 | ORAL_TABLET | Freq: Two times a day (BID) | ORAL | 0 refills | Status: DC

## 2019-02-07 NOTE — Telephone Encounter (Signed)
Last refill. 01/03/19

## 2019-02-13 ENCOUNTER — Encounter (INDEPENDENT_AMBULATORY_CARE_PROVIDER_SITE_OTHER): Payer: PPO | Admitting: Ophthalmology

## 2019-02-13 ENCOUNTER — Other Ambulatory Visit: Payer: Self-pay

## 2019-02-13 DIAGNOSIS — H35373 Puckering of macula, bilateral: Secondary | ICD-10-CM | POA: Diagnosis not present

## 2019-02-13 DIAGNOSIS — H43813 Vitreous degeneration, bilateral: Secondary | ICD-10-CM | POA: Diagnosis not present

## 2019-02-14 ENCOUNTER — Ambulatory Visit: Payer: PPO | Admitting: Psychiatry

## 2019-02-14 ENCOUNTER — Encounter: Payer: Self-pay | Admitting: Psychiatry

## 2019-02-14 DIAGNOSIS — F329 Major depressive disorder, single episode, unspecified: Secondary | ICD-10-CM | POA: Diagnosis not present

## 2019-02-14 DIAGNOSIS — F32A Depression, unspecified: Secondary | ICD-10-CM

## 2019-02-14 DIAGNOSIS — F419 Anxiety disorder, unspecified: Secondary | ICD-10-CM | POA: Diagnosis not present

## 2019-02-14 MED ORDER — SERTRALINE HCL 100 MG PO TABS: 200.0000 mg | ORAL_TABLET | Freq: Every day | ORAL | 1 refills | Status: DC

## 2019-02-14 NOTE — Progress Notes (Signed)
Wendy Rose [id number] 1945/11/05 73 y.o.  Subjective:   Patient ID:  Wendy Rose is a 73 y.o. (DOB 1945/11/23) female.  Chief Complaint:  Chief Complaint  Patient presents with  . Anxiety  . Depression  . Sleeping Problem    HPI [name] presents to the office today for follow-up of depression and anxiety. She reports that she did not fall asleep until after 3 am last night and woke up with a severe HA. Reports that she did not sleep the night before that. Reports that late last week she slept almost around the clock for about 2-3 days. She reports that at night she will stay up later trying to accomplish tasks since that is when her pain is the least severe. She reports that she had a medical apt yesterday and that she was in a packed waiting room for 3 hours and then had stressful drive home. Reports that she typically does not sleep well the night before an apt because she is afraid she will oversleep. She reports that she often cannot fall asleep due to worry.   She reports "I really was better for a couple of weeks and then I got on that sleep thing." She reports that her sleep had been better until late last week. She reports that anxiety and depression have improved since increase in Sertraline. She reports that she has been limiting exposure to news since this can negatively affect her mood. Some worry about her son, "but not as much as I used to." She reports that her motivation has improved very slightly and motivation remains very low. Reports that she continues to not take care of many important tasks. Denies change in energy. Denies SI.   She reports poor concentration and memory and thinks it could be related to insomnia.   Reports that she is having a plumbing issue and cannot get a plumber to come. Her phone broke yesterday.    Past Medication Trials: Abilify- wt gain Zyprexa- excessive sedation Seroquel- excessive sedation Prozac- took for  years Sertraline Wellbutrin XL Xanax Ativan  Review of Systems:  Review of Systems  Eyes: Positive for visual disturbance.  Gastrointestinal:       Reports occ IBS s/s.   Musculoskeletal: Positive for gait problem.  Neurological: Positive for weakness.    Medications: I have reviewed the patient's current medications.  Current Outpatient Medications  Medication Sig Dispense Refill  . ALPRAZolam (XANAX) 1 MG tablet Take 1 tablet (1 mg total) by mouth 2 (two) times daily as needed for up to 30 days for anxiety. take 1 tablet by mouth twice a day if needed FOR ANXIETY. 60 tablet 2  . apixaban (ELIQUIS) 5 MG TABS tablet Take 1 tablet (5 mg total) by mouth 2 (two) times daily. 180 tablet 1  . atorvastatin (LIPITOR) 20 MG tablet Take 1 tablet (20 mg total) by mouth daily at 6 PM. 30 tablet 4  . B Complex-C (B-COMPLEX WITH VITAMIN C) tablet Take 1 tablet by mouth daily.    Marland Kitchen buPROPion (WELLBUTRIN XL) 300 MG 24 hr tablet Take 1 tablet (300 mg total) by mouth every morning. 90 tablet 1  . calcium-vitamin D (CALCIUM 500+D) 500-200 MG-UNIT per tablet Take 1 tablet by mouth 2 (two) times daily.     Marland Kitchen co-enzyme Q-10 50 MG capsule Take 50 mg by mouth 2 (two) times daily.    . Creatinine POWD Take by mouth as directed.     . hydrochlorothiazide (  HYDRODIURIL) 12.5 MG tablet Take 12.5 mg by mouth daily.    Marland Kitchen HYDROcodone-acetaminophen (NORCO) 10-325 MG tablet Take 1 tablet by mouth every 12 (twelve) hours. 60 tablet 0  . MAGNESIUM MALATE PO Take by mouth. Twice daily    . Magnesium Oxide (MAG-200 PO) Take 1 tablet by mouth 2 (two) times daily.     Marland Kitchen MELATONIN PO Take by mouth.    . pantoprazole (PROTONIX) 40 MG tablet TAKE 1 TABLET BY MOUTH ONCE DAILY 90 tablet 3  . sertraline (ZOLOFT) 100 MG tablet Take 2 tablets (200 mg total) by mouth daily. 60 tablet 1   No current facility-administered medications for this visit.     Medication Side Effects: None  Allergies:  Allergies  Allergen  Reactions  . Contrast Media [Iodinated Diagnostic Agents] Shortness Of Breath    "difficulty breathing"  . Ciprofloxacin Itching    Pt stated itching   . Sulfa Antibiotics     Other reaction(s): Unknown  . Cefuroxime Axetil Rash    REACTION: Rash  . Codeine Other (See Comments)    REACTION: Reaction not known  . Naproxen Other (See Comments)    REACTION: Reaction not known  . Nitrofurantoin Rash    REACTION: Increase LFT's  . Sulfonamide Derivatives Other (See Comments)    REACTION: Reaction not known  . Sulindac Other (See Comments)    REACTION: Reaction not known    Past Medical History:  Diagnosis Date  . A-fib ([place])   . Anemia   . Anxiety   . Arthritis   . Chronic headaches   . Depression   . Fibromyalgia   . GERD (gastroesophageal reflux disease)   . History of echocardiogram    a. echo 02/2013: normal LV function with an EF of 55-60%, mild MR, mild TR, and PA peak pressure of 40 mmHg  . Hyperlipemia   . IBS (irritable bowel syndrome)   . MVP (mitral valve prolapse)   . Pneumonia   . PONV (postoperative nausea and vomiting)   . S/P cardiac pacemaker procedure, 02/16/13, Bio tronik device 02/19/2013  . Symptomatic bradycardia    s/p Biotronik Evia dual chamber pacemaker implant 02/16/2013 by Dr Rayann Heman  . UTI (lower urinary tract infection)   . Vertigo     Family History  Problem Relation Age of Onset  . Hypertension Mother   . Heart disease Father   . Alcohol abuse Father   . Diabetes Sister   . Alcohol abuse Sister   . Heart disease Brother   . Colon cancer Neg Hx   . Esophageal cancer Neg Hx   . Stomach cancer Neg Hx   . Rectal cancer Neg Hx     Social History   Socioeconomic History  . Marital status: Married    Spouse name: Not on file  . Number of children: Not on file  . Years of education: Not on file  . Highest education level: Not on file  Occupational History  . Not on file  Social Needs  . Financial resource strain: Not on file  .  Food insecurity    Worry: Not on file    Inability: Not on file  . Transportation needs    Medical: Not on file    Non-medical: Not on file  Tobacco Use  . Smoking status: Never Smoker  . Smokeless tobacco: Never Used  Substance and Sexual Activity  . Alcohol use: No    Alcohol/week: 0.0 standard drinks  . Drug use: No  .  Sexual activity: Yes  Lifestyle  . Physical activity    Days per week: Not on file    Minutes per session: Not on file  . Stress: Not on file  Relationships  . Social Herbalist on phone: Not on file    Gets together: Not on file    Attends religious service: Not on file    Active member of club or organization: Not on file    Attends meetings of clubs or organizations: Not on file    Relationship status: Not on file  . Intimate partner violence    Fear of current or ex partner: Not on file    Emotionally abused: Not on file    Physically abused: Not on file    Forced sexual activity: Not on file  Other Topics Concern  . Not on file  Social History Narrative  . Not on file    Past Medical History, Surgical history, Social history, and Family history were reviewed and updated as appropriate.   Please see review of systems for further details on the patient's review from today.   Objective:   Physical Exam:  There were no vitals taken for this visit.  Physical Exam Constitutional:      General: She is not in acute distress.    Appearance: She is well-developed.  Musculoskeletal:     Comments: Ambulates with rolator.   Neurological:     Mental Status: She is alert and oriented to person, place, and time.     Coordination: Coordination normal.  Psychiatric:        Attention and Perception: Attention and perception normal. She does not perceive auditory or visual hallucinations.        Mood and Affect: Mood is anxious. Affect is not labile, blunt, angry or inappropriate.        Speech: Speech normal.        Behavior: Behavior normal.         Thought Content: Thought content normal. Thought content does not include homicidal or suicidal ideation. Thought content does not include homicidal or suicidal plan.        Cognition and Memory: Cognition and memory normal.        Judgment: Judgment normal.     Comments: Mildly depressed moodD Insight intact. No delusions.      Lab Review:     Component Value Date/Time   NA 138 07/28/2018 1437   K 4.0 07/28/2018 1437   CL 101 07/28/2018 1437   CO2 23 07/28/2018 1437   GLUCOSE 106 (H) 07/28/2018 1437   GLUCOSE 95 06/07/2018 1558   BUN 19 07/28/2018 1437   CREATININE 0.71 07/28/2018 1437   CREATININE 0.65 06/07/2018 1558   CALCIUM 9.5 07/28/2018 1437   PROT 6.2 06/07/2018 1558   ALBUMIN 3.7 05/06/2017 2041   AST 20 06/07/2018 1558   ALT 18 06/07/2018 1558   ALKPHOS 68 05/06/2017 2041   BILITOT 0.5 06/07/2018 1558   GFRNONAA 85 07/28/2018 1437   GFRNONAA 67 03/04/2018 1004   GFRAA 98 07/28/2018 1437   GFRAA 78 03/04/2018 1004       Component Value Date/Time   WBC 6.7 06/07/2018 1558   RBC 4.58 06/07/2018 1558   HGB 12.6 06/07/2018 1558   HCT 37.9 06/07/2018 1558   PLT 189 06/07/2018 1558   MCV 82.8 06/07/2018 1558   MCH 27.5 06/07/2018 1558   MCHC 33.2 06/07/2018 1558   RDW 13.9 06/07/2018 1558   LYMPHSABS  1,755 06/07/2018 1558   MONOABS 0.9 05/06/2017 2041   EOSABS 161 06/07/2018 1558   BASOSABS 40 06/07/2018 1558    No results found for: POCLITH, LITHIUM   No results found for: PHENYTOIN, PHENOBARB, VALPROATE, CBMZ   .res Assessment: Plan:    Discussed potential benefits, risks, and side effects of increasing Sertraline to improve mood and anxiety. Discussed that cycles of insomnia seem to be anxiety related and further improvement in anxiety may also improve sleep. Will increase Sertraline to 200 mg po qd for anxiety and depression. Will continue Wellbutrin XL 300 mg po qd for depression.  Pt to f/u in 4-6 weeks or sooner if clinically indicated.    Tanita was seen today for anxiety, depression and sleeping problem.  Diagnoses and all orders for this visit:  Depression, unspecified depression type -     sertraline (ZOLOFT) 100 MG tablet; Take 2 tablets (200 mg total) by mouth daily.  Anxiety -     sertraline (ZOLOFT) 100 MG tablet; Take 2 tablets (200 mg total) by mouth daily.     Please see After Visit Summary for patient specific instructions.  Future Appointments  Date Time Provider [place]  03/07/2019  9:20 AM Baxley, Cresenciano Lick, MD [facility]  03/10/2019  3:00 PM Elby Showers, MD [facility]  03/21/2019  2:00 PM Thayer Headings, PMHNP CP[facility] None  04/10/2019  3:30 PM Baird Lyons D, MD [facility] None  04/18/2019  8:00 AM [facility] DEVICE REMOTES [facility]  07/18/2019  8:00 AM [facility] DEVICE REMOTES [facility]  10/17/2019  8:00 AM [facility] DEVICE REMOTES [facility]  01/16/2020  8:00 AM [facility] DEVICE REMOTES [facility]  02/15/2020 12:45 PM Hayden Pedro, MD [facility] None  04/16/2020  8:00 AM [facility] DEVICE REMOTES [facility]  07/16/2020  8:00 AM [facility] DEVICE REMOTES [facility]    No orders of the defined types were placed in this encounter.   -------------------------------

## 2019-02-22 DIAGNOSIS — R0902 Hypoxemia: Secondary | ICD-10-CM | POA: Diagnosis not present

## 2019-03-07 ENCOUNTER — Other Ambulatory Visit: Payer: Self-pay

## 2019-03-07 ENCOUNTER — Other Ambulatory Visit: Payer: PPO | Admitting: Internal Medicine

## 2019-03-07 DIAGNOSIS — Z0000 Encounter for general adult medical examination without abnormal findings: Secondary | ICD-10-CM

## 2019-03-07 DIAGNOSIS — G713 Mitochondrial myopathy, not elsewhere classified: Secondary | ICD-10-CM

## 2019-03-07 DIAGNOSIS — Z7901 Long term (current) use of anticoagulants: Secondary | ICD-10-CM

## 2019-03-07 DIAGNOSIS — G894 Chronic pain syndrome: Secondary | ICD-10-CM

## 2019-03-07 DIAGNOSIS — G609 Hereditary and idiopathic neuropathy, unspecified: Secondary | ICD-10-CM

## 2019-03-07 DIAGNOSIS — F32A Depression, unspecified: Secondary | ICD-10-CM

## 2019-03-07 DIAGNOSIS — R531 Weakness: Secondary | ICD-10-CM | POA: Diagnosis not present

## 2019-03-07 DIAGNOSIS — Z950 Presence of cardiac pacemaker: Secondary | ICD-10-CM

## 2019-03-07 DIAGNOSIS — M797 Fibromyalgia: Secondary | ICD-10-CM | POA: Diagnosis not present

## 2019-03-07 DIAGNOSIS — F329 Major depressive disorder, single episode, unspecified: Secondary | ICD-10-CM | POA: Diagnosis not present

## 2019-03-08 LAB — CBC WITH DIFFERENTIAL/PLATELET
Absolute Monocytes: 724 cells/uL (ref 200–950)
Basophils Absolute: 43 cells/uL (ref 0–200)
Basophils Relative: 0.6 %
Eosinophils Absolute: 156 cells/uL (ref 15–500)
Eosinophils Relative: 2.2 %
HCT: 39.1 % (ref 35.0–45.0)
Hemoglobin: 13 g/dL (ref 11.7–15.5)
Lymphs Abs: 2308 cells/uL (ref 850–3900)
MCH: 27.9 pg (ref 27.0–33.0)
MCHC: 33.2 g/dL (ref 32.0–36.0)
MCV: 83.9 fL (ref 80.0–100.0)
MPV: 10.7 fL (ref 7.5–12.5)
Monocytes Relative: 10.2 %
Neutro Abs: 3870 cells/uL (ref 1500–7800)
Neutrophils Relative %: 54.5 %
Platelets: 219 10*3/uL (ref 140–400)
RBC: 4.66 10*6/uL (ref 3.80–5.10)
RDW: 13.6 % (ref 11.0–15.0)
Total Lymphocyte: 32.5 %
WBC: 7.1 10*3/uL (ref 3.8–10.8)

## 2019-03-08 LAB — COMPLETE METABOLIC PANEL WITH GFR
AG Ratio: 2 (calc) (ref 1.0–2.5)
ALT: 21 U/L (ref 6–29)
AST: 24 U/L (ref 10–35)
Albumin: 4.1 g/dL (ref 3.6–5.1)
Alkaline phosphatase (APISO): 59 U/L (ref 37–153)
BUN: 18 mg/dL (ref 7–25)
CO2: 26 mmol/L (ref 20–32)
Calcium: 9.2 mg/dL (ref 8.6–10.4)
Chloride: 103 mmol/L (ref 98–110)
Creat: 0.71 mg/dL (ref 0.60–0.93)
GFR, Est African American: 97 mL/min/{1.73_m2} (ref >=60)
GFR, Est Non African American: 84 mL/min/{1.73_m2} (ref >=60)
Globulin: 2.1 g/dL (calc) (ref 1.9–3.7)
Glucose, Bld: 90 mg/dL (ref 65–99)
Potassium: 4.4 mmol/L (ref 3.5–5.3)
Sodium: 140 mmol/L (ref 135–146)
Total Bilirubin: 0.5 mg/dL (ref 0.2–1.2)
Total Protein: 6.2 g/dL (ref 6.1–8.1)

## 2019-03-08 LAB — LIPID PANEL
Cholesterol: 152 mg/dL (ref <200)
HDL: 61 mg/dL (ref >=50)
LDL Cholesterol (Calc): 75 mg/dL (calc)
Non-HDL Cholesterol (Calc): 91 mg/dL (calc) (ref <130)
Total CHOL/HDL Ratio: 2.5 (calc) (ref <5.0)
Triglycerides: 78 mg/dL (ref <150)

## 2019-03-08 LAB — TSH: TSH: 1.66 mIU/L (ref 0.40–4.50)

## 2019-03-10 ENCOUNTER — Other Ambulatory Visit: Payer: Self-pay

## 2019-03-10 ENCOUNTER — Ambulatory Visit (INDEPENDENT_AMBULATORY_CARE_PROVIDER_SITE_OTHER): Payer: PPO | Admitting: Internal Medicine

## 2019-03-10 ENCOUNTER — Encounter: Payer: Self-pay | Admitting: Internal Medicine

## 2019-03-10 VITALS — BP 120/70 | HR 78 | Ht 70.0 in | Wt 180.0 lb

## 2019-03-10 DIAGNOSIS — N390 Urinary tract infection, site not specified: Secondary | ICD-10-CM | POA: Diagnosis not present

## 2019-03-10 DIAGNOSIS — F411 Generalized anxiety disorder: Secondary | ICD-10-CM | POA: Diagnosis not present

## 2019-03-10 DIAGNOSIS — F324 Major depressive disorder, single episode, in partial remission: Secondary | ICD-10-CM | POA: Diagnosis not present

## 2019-03-10 DIAGNOSIS — M797 Fibromyalgia: Secondary | ICD-10-CM | POA: Diagnosis not present

## 2019-03-10 DIAGNOSIS — Z950 Presence of cardiac pacemaker: Secondary | ICD-10-CM | POA: Diagnosis not present

## 2019-03-10 DIAGNOSIS — G713 Mitochondrial myopathy, not elsewhere classified: Secondary | ICD-10-CM

## 2019-03-10 DIAGNOSIS — H903 Sensorineural hearing loss, bilateral: Secondary | ICD-10-CM

## 2019-03-10 DIAGNOSIS — Z7901 Long term (current) use of anticoagulants: Secondary | ICD-10-CM

## 2019-03-10 DIAGNOSIS — R8290 Unspecified abnormal findings in urine: Secondary | ICD-10-CM | POA: Diagnosis not present

## 2019-03-10 DIAGNOSIS — G609 Hereditary and idiopathic neuropathy, unspecified: Secondary | ICD-10-CM | POA: Diagnosis not present

## 2019-03-10 DIAGNOSIS — R531 Weakness: Secondary | ICD-10-CM

## 2019-03-10 DIAGNOSIS — Z0000 Encounter for general adult medical examination without abnormal findings: Secondary | ICD-10-CM

## 2019-03-10 DIAGNOSIS — M8580 Other specified disorders of bone density and structure, unspecified site: Secondary | ICD-10-CM

## 2019-03-10 DIAGNOSIS — F5102 Adjustment insomnia: Secondary | ICD-10-CM

## 2019-03-10 DIAGNOSIS — G8929 Other chronic pain: Secondary | ICD-10-CM

## 2019-03-10 DIAGNOSIS — G894 Chronic pain syndrome: Secondary | ICD-10-CM

## 2019-03-10 DIAGNOSIS — B9620 Unspecified Escherichia coli [E. coli] as the cause of diseases classified elsewhere: Secondary | ICD-10-CM

## 2019-03-10 LAB — POCT URINALYSIS DIPSTICK
Bilirubin, UA: NEGATIVE
Blood, UA: NEGATIVE
Glucose, UA: NEGATIVE
Ketones, UA: NEGATIVE
Nitrite, UA: POSITIVE
Protein, UA: NEGATIVE
Spec Grav, UA: 1.015 (ref 1.010–1.025)
Urobilinogen, UA: 0.2 E.U./dL
pH, UA: 6 (ref 5.0–8.0)

## 2019-03-12 LAB — PAIN MGMT, PROFILE 8 W/CONF, U
6 Acetylmorphine: NEGATIVE ng/mL
Alcohol Metabolites: NEGATIVE ng/mL (ref <500)
Alphahydroxyalprazolam: 79 ng/mL
Alphahydroxymidazolam: NEGATIVE ng/mL
Alphahydroxytriazolam: NEGATIVE ng/mL
Aminoclonazepam: NEGATIVE ng/mL
Amphetamines: NEGATIVE ng/mL
Benzodiazepines: POSITIVE ng/mL
Buprenorphine, Urine: NEGATIVE ng/mL
Cocaine Metabolite: NEGATIVE ng/mL
Codeine: NEGATIVE ng/mL
Creatinine: 49.9 mg/dL
Hydrocodone: 386 ng/mL
Hydromorphone: NEGATIVE ng/mL
Hydroxyethylflurazepam: NEGATIVE ng/mL
Lorazepam: NEGATIVE ng/mL
MDMA: NEGATIVE ng/mL
Marijuana Metabolite: NEGATIVE ng/mL
Morphine: NEGATIVE ng/mL
Nordiazepam: NEGATIVE ng/mL
Norhydrocodone: 1309 ng/mL
Opiates: POSITIVE ng/mL
Oxazepam: NEGATIVE ng/mL
Oxidant: NEGATIVE ug/mL
Oxycodone: NEGATIVE ng/mL
Temazepam: NEGATIVE ng/mL
pH: 7.3 (ref 4.5–9.0)

## 2019-03-12 LAB — URINE CULTURE
MICRO NUMBER:: 725018
SPECIMEN QUALITY:: ADEQUATE

## 2019-03-13 ENCOUNTER — Other Ambulatory Visit: Payer: Self-pay

## 2019-03-13 MED ORDER — AMOXICILLIN-POT CLAVULANATE 500-125 MG PO TABS: 500.0000 mg | ORAL_TABLET | Freq: Three times a day (TID) | ORAL | 0 refills | Status: DC

## 2019-03-21 ENCOUNTER — Ambulatory Visit (INDEPENDENT_AMBULATORY_CARE_PROVIDER_SITE_OTHER): Payer: PPO | Admitting: Psychiatry

## 2019-03-21 ENCOUNTER — Encounter: Payer: Self-pay | Admitting: Psychiatry

## 2019-03-21 ENCOUNTER — Other Ambulatory Visit: Payer: Self-pay

## 2019-03-21 DIAGNOSIS — F419 Anxiety disorder, unspecified: Secondary | ICD-10-CM

## 2019-03-21 DIAGNOSIS — F329 Major depressive disorder, single episode, unspecified: Secondary | ICD-10-CM

## 2019-03-21 DIAGNOSIS — F32A Depression, unspecified: Secondary | ICD-10-CM

## 2019-03-21 MED ORDER — BUPROPION HCL ER (XL) 300 MG PO TB24: 300.0000 mg | ORAL_TABLET | ORAL | 1 refills | Status: DC

## 2019-03-21 MED ORDER — SERTRALINE HCL 100 MG PO TABS: 200.0000 mg | ORAL_TABLET | Freq: Every day | ORAL | 0 refills | Status: DC
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
## 2019-03-25 DIAGNOSIS — R0902 Hypoxemia: Secondary | ICD-10-CM | POA: Diagnosis not present

## 2019-04-03 ENCOUNTER — Other Ambulatory Visit: Payer: Self-pay

## 2019-04-03 ENCOUNTER — Encounter: Payer: Self-pay | Admitting: Podiatry

## 2019-04-03 ENCOUNTER — Ambulatory Visit (INDEPENDENT_AMBULATORY_CARE_PROVIDER_SITE_OTHER): Payer: PPO | Admitting: Podiatry

## 2019-04-03 VITALS — BP 145/79 | Temp 97.9°F

## 2019-04-03 DIAGNOSIS — M79675 Pain in left toe(s): Secondary | ICD-10-CM

## 2019-04-03 DIAGNOSIS — L600 Ingrowing nail: Secondary | ICD-10-CM | POA: Diagnosis not present

## 2019-04-03 DIAGNOSIS — B351 Tinea unguium: Secondary | ICD-10-CM | POA: Diagnosis not present

## 2019-04-03 DIAGNOSIS — M79674 Pain in right toe(s): Secondary | ICD-10-CM | POA: Diagnosis not present

## 2019-04-03 NOTE — Progress Notes (Signed)
Subjective:   Patient ID: Wendy Rose, female   DOB: 73 y.o.   MRN: [id number]   HPI Patient presents stating that she has had trouble with all of her nails and the big ones become thickened and she cannot cut them and she loses them at times and wants to know what might be a solution for her problem.  Patient does not smoke likes to be active   Review of Systems  All other systems reviewed and are negative.       Objective:  Physical Exam Vitals signs and nursing note reviewed.  Constitutional:      Appearance: She is well-developed.  Pulmonary:     Effort: Pulmonary effort is normal.  Musculoskeletal: Normal range of motion.  Skin:    General: Skin is warm.  Neurological:     Mental Status: She is alert.     Neurovascular status was found to be intact with muscle strength adequate.  Range of motion subtalar midtarsal joint was adequate with patient noted to have damaged hallux nail right over left that is dystrophic with thickened nailbeds that become incurvated and can become discomforting along with elongation of remaining nails with inability for her to take care of them herself secondary to health conditions.  Patient does have muscle weakness with mitochondrial disease and has good digital perfusion     Assessment:  Chronic digital deformity of the nailbeds 1-5 both feet with moderate discomfort especially when they grow out with inability to take care of herself with damaged big toenails bilateral     Plan:  H&P all conditions reviewed debridement accomplished and do not recommend permanent correction given that this may be necessary at one point in future.  At this time she will be seen back on an as-needed basis and is encouraged to call with any questions which may occur and understands the consideration for permanent procedure that I educated her on today
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
## 2019-04-08 NOTE — Patient Instructions (Addendum)
It was a pleasure to see you today.  Continue narcotic pain medication sparingly.  Take Augmentin for urinary tract infection and follow-up in 2 weeks.  Continue other medications as previously prescribed.  Return in 3 months for pain management follow-up.

## 2019-04-10 ENCOUNTER — Ambulatory Visit: Payer: PPO | Admitting: Internal Medicine

## 2019-04-10 ENCOUNTER — Telehealth: Payer: Self-pay | Admitting: Internal Medicine

## 2019-04-10 NOTE — Telephone Encounter (Signed)
Patient was notified and verbalized understanding.

## 2019-04-10 NOTE — Telephone Encounter (Signed)
Wendy Rose [phone]  [name] called to say late last night she fell getting out of her bathtub and hit her head on the bathroom cabinet, no cuts, this morning she has headache and aches all over, she is wandering if she should be seen. She did not lose continuousness.

## 2019-04-10 NOTE — Telephone Encounter (Signed)
It has been nearly 12 hours and I doubt any thing is going to develop if she is OK now. Call us if develops symptoms of concussion,  confusion , severe headache.

## 2019-04-11 ENCOUNTER — Other Ambulatory Visit: Payer: Self-pay | Admitting: Cardiovascular Disease

## 2019-04-13 ENCOUNTER — Ambulatory Visit (INDEPENDENT_AMBULATORY_CARE_PROVIDER_SITE_OTHER): Payer: PPO | Admitting: Internal Medicine

## 2019-04-13 ENCOUNTER — Other Ambulatory Visit: Payer: Self-pay

## 2019-04-13 ENCOUNTER — Encounter: Payer: Self-pay | Admitting: Internal Medicine

## 2019-04-13 ENCOUNTER — Other Ambulatory Visit: Payer: Self-pay | Admitting: Internal Medicine

## 2019-04-13 DIAGNOSIS — Z23 Encounter for immunization: Secondary | ICD-10-CM | POA: Diagnosis not present

## 2019-04-13 DIAGNOSIS — J9611 Chronic respiratory failure with hypoxia: Secondary | ICD-10-CM

## 2019-04-13 DIAGNOSIS — G713 Mitochondrial myopathy, not elsewhere classified: Secondary | ICD-10-CM

## 2019-04-13 MED ORDER — HYDROCODONE-ACETAMINOPHEN 10-325 MG PO TABS: 1.0000 | ORAL_TABLET | Freq: Two times a day (BID) | ORAL | 0 refills | Status: DC

## 2019-04-13 NOTE — Progress Notes (Signed)
HPI F never smoker followed for SOB and cough, complicated by A. Fib/ pacemaker, , headaches, depression, fibromyalgia, GERD, IBS, mitochondrial myopathy Husband Mikki Santee was patient here with severe asthma/COPD, expired Walk Test on room air 02/28/2018-maximum heart rate 115, lowest saturation 96% PFT 05/30/2018-moderate diffusion defect with normal flows and lung volumes, no response to dilator.  FVC 3.91/110%, FEV1 3.36/125%, ratio 1.86, TLC 99%, DLCO 61% ---------------------------------------------------------------------   06/09/2018- 72 yoFnever smoker followed for SOB and cough, complicated by A. Fib/ pacemaker,, headaches, depression, fibromyalgia, GERD, IBS, mitochondrial myopathy -----Cough and DOE: Pt continues to feel SOB with exertion; increased swelling in feet and hands. PCP wanted follow up with CY first prior to giving medications.  O2  2 L sleep/[facility] after abnormal overnight oximetry. Arrival saturation 100%, BP 124/66, pulse 98 Not anemic on recent CBC. Dr Renold Genta has ordered some labs evaluating peripheral edema. Stable dyspnea on exertion. Planes of difficulty initiating and maintaining sleep-"busy brain"-loss of husband and concerns about her own health. Tried Abilify for sleep but it caused weight gain.  She is mostly sleeping in the daytime. CXR 06/08/18-  The cardiomediastinal silhouette is unremarkable. LEFT-sided pacemaker again noted. There is no evidence of focal airspace disease, pulmonary edema, suspicious pulmonary nodule/mass, pleural effusion, or pneumothorax. No acute bony abnormalities are identified. No active cardiopulmonary disease. PFT 05/30/2018-moderate diffusion defect with normal flows and lung volumes, no response to dilator.  FVC 3.91/110%, FEV1 3.36/125%, ratio 1.86, TLC 99%, DLCO 61%   04/13/19-73 yoFnever smoker followed for SOB and cough, Chronic Hypoxic Respiratory Failure, complicated by A. Fib/ pacemaker,, headaches, depression, fibromyalgia,  GERD, IBS, mitochondrial myopathy, Insomnia O2  2 L sleep/[facility] after abnormal overnight oximetry. -----pt states she has increased shortness of breath with activity; denies acute symptoms; pt states her sleep has varied, reports getting about 5-6 hours of sleep, sometimes 3 hours sometimes no sleep Arrival room air sat 98%, Body weight 176 lbs For sleep uses melatonin and occ 1/2 tab xanax Hemoglobin 13 03/07/19 Realizes most of her problems are related to her myopathy. Staying at home. Covid careful.   ROS-see HPI   + = positive Constitutional:    weight loss, night sweats, fevers, chills, fatigue, lassitude. HEENT:    headaches, difficulty swallowing, tooth/dental problems, sore throat,       sneezing, itching, ear ache, nasal congestion, + post nasal drip, snoring CV:    chest pain, orthopnea, PND, swelling in lower extremities, anasarca,                                                  dizziness, palpitations Resp:   +shortness of breath with exertion or at rest.                productive cough,  + non-productive cough, coughing up of blood.              change in color of mucus.  wheezing.   Skin:    rash or lesions. GI:  + heartburn, indigestion, abdominal pain, nausea, vomiting, diarrhea,                 change in bowel habits, loss of appetite GU: dysuria, change in color of urine, no urgency or frequency.   flank pain. MS:   joint pain, stiffness, decreased range of motion, back pain. Neuro-     nothing  unusual Psych:  change in mood or affect.  depression or anxiety.   memory loss.  OBJ- Physical Exam General- Alert, Oriented, Affect-appropriate, Distress- none acute Skin- rash-none, lesions- none, excoriation- none Lymphadenopathy- none Head- atraumatic            Eyes- Gross vision intact, PERRLA, conjunctivae and secretions clear            Ears- Hearing, canals-normal            Nose- +sniffing, Clear, no-Septal dev, mucus, polyps, erosion, perforation              Throat- Mallampati II , mucosa clear , drainage- none, tonsils- atrophic Neck- flexible , trachea midline, no stridor , thyroid nl, carotid no bruit Chest - symmetrical excursion , unlabored           Heart/CV- RRR , no murmur , no gallop  , no rub, nl s1 s2                           - JVD- none , edema- none, stasis changes- none, varices- none           Lung- clear to P&A, wheeze- none, cough- none at this visit , dullness-none, rub- none           Chest wall-+ pacemaker left Abd-  Br/ Gen/ Rectal- Not done, not indicated Extrem- cyanosis- none, clubbing, none, atrophy- none, strength +rolling walker Neuro- grossly intact to observation

## 2019-04-13 NOTE — Telephone Encounter (Signed)
Drug screening was done on 03/10/19, last refill 02/07/2019

## 2019-04-13 NOTE — Patient Instructions (Signed)
Order- flu vax    Senior  Ok to continue oxygen 2l while sleeping and if needed in the day time  Please call if we can help

## 2019-04-13 NOTE — Telephone Encounter (Signed)
Wendy Rose [phone]  [facility] -- [address]  HYDROcodone-acetaminophen [facility]) 10-325 MG tablet  [name] called to say she needs a refill on above listed medication

## 2019-04-18 ENCOUNTER — Ambulatory Visit (INDEPENDENT_AMBULATORY_CARE_PROVIDER_SITE_OTHER): Payer: PPO | Admitting: *Deleted

## 2019-04-18 DIAGNOSIS — I495 Sick sinus syndrome: Secondary | ICD-10-CM | POA: Diagnosis not present

## 2019-04-20 LAB — CUP PACEART REMOTE DEVICE CHECK
Battery Remaining Percentage: 55 %
Brady Statistic RV Percent Paced: 98 %
Date Time Interrogation Session: 20200910090351
Implantable Lead Implant Date: 20140710
Implantable Lead Implant Date: 20140710
Implantable Lead Location: 753859
Implantable Lead Location: 753860
Implantable Lead Model: 346
Implantable Lead Model: 346
Implantable Lead Serial Number: 29334372
Implantable Lead Serial Number: 29426855
Implantable Pulse Generator Implant Date: 20140710
Lead Channel Impedance Value: 761 Ohm
Lead Channel Pacing Threshold Amplitude: 0.5 V
Lead Channel Pacing Threshold Pulse Width: 0.4 ms
Lead Channel Sensing Intrinsic Amplitude: 19.6 mV
Lead Channel Setting Pacing Amplitude: 2.4 V
Lead Channel Setting Pacing Pulse Width: 0.4 ms
Pulse Gen Serial Number: 66385898

## 2019-04-22 DIAGNOSIS — J9611 Chronic respiratory failure with hypoxia: Secondary | ICD-10-CM | POA: Insufficient documentation

## 2019-04-22 NOTE — Assessment & Plan Note (Signed)
Mainly nocturnal hypoventilation related to weakness from myopathy Pan- continue O2 2L for sleep

## 2019-04-22 NOTE — Assessment & Plan Note (Signed)
Attributes most of her weakness and exertional dyspnea to this. Realizes it is slowly getting worse. Followed by Neurology

## 2019-04-25 DIAGNOSIS — R0902 Hypoxemia: Secondary | ICD-10-CM | POA: Diagnosis not present

## 2019-05-03 ENCOUNTER — Encounter: Payer: Self-pay | Admitting: Cardiology

## 2019-05-03 NOTE — Progress Notes (Signed)
Remote pacemaker transmission.   

## 2019-05-25 DIAGNOSIS — R0902 Hypoxemia: Secondary | ICD-10-CM | POA: Diagnosis not present

## 2019-06-09 ENCOUNTER — Other Ambulatory Visit: Payer: Self-pay | Admitting: Pharmacist Clinician (PhC)/ Clinical Pharmacy Specialist

## 2019-06-09 ENCOUNTER — Telehealth: Payer: Self-pay | Admitting: Internal Medicine

## 2019-06-09 MED ORDER — APIXABAN 5 MG PO TABS: 5.0000 mg | ORAL_TABLET | Freq: Two times a day (BID) | ORAL | 1 refills | Status: DC

## 2019-06-09 NOTE — Telephone Encounter (Signed)
Asante Vinokur [phone]  [place]  HYDROcodone-acetaminophen [facility]) 10-325 MG tablet   Octayvia called to say she needs a refill on above medication

## 2019-06-09 NOTE — Telephone Encounter (Signed)
7F  80 kg SCr .071, LOV TK 3/20

## 2019-06-09 NOTE — Telephone Encounter (Signed)
After speaking with Dr Renold Genta I called patient back and scheduled for a 3 month follow up on pain management, before she can get refill.

## 2019-06-12 ENCOUNTER — Ambulatory Visit (INDEPENDENT_AMBULATORY_CARE_PROVIDER_SITE_OTHER): Payer: PPO | Admitting: Internal Medicine

## 2019-06-12 ENCOUNTER — Other Ambulatory Visit: Payer: Self-pay

## 2019-06-12 ENCOUNTER — Encounter: Payer: Self-pay | Admitting: Internal Medicine

## 2019-06-12 VITALS — BP 120/70 | HR 82 | Temp 98.0°F | Ht 68.0 in | Wt 178.0 lb

## 2019-06-12 DIAGNOSIS — G894 Chronic pain syndrome: Secondary | ICD-10-CM

## 2019-06-12 DIAGNOSIS — M797 Fibromyalgia: Secondary | ICD-10-CM | POA: Diagnosis not present

## 2019-06-14 LAB — PAIN MGMT, PROFILE 8 W/CONF, U
6 Acetylmorphine: NEGATIVE ng/mL
Alcohol Metabolites: NEGATIVE ng/mL (ref <500)
Alphahydroxyalprazolam: 134 ng/mL
Alphahydroxymidazolam: NEGATIVE ng/mL
Alphahydroxytriazolam: NEGATIVE ng/mL
Aminoclonazepam: NEGATIVE ng/mL
Amphetamines: NEGATIVE ng/mL
Benzodiazepines: POSITIVE ng/mL
Buprenorphine, Urine: NEGATIVE ng/mL
Cocaine Metabolite: NEGATIVE ng/mL
Codeine: NEGATIVE ng/mL
Creatinine: 77.1 mg/dL
Hydrocodone: 540 ng/mL
Hydromorphone: NEGATIVE ng/mL
Hydroxyethylflurazepam: NEGATIVE ng/mL
Lorazepam: NEGATIVE ng/mL
MDMA: NEGATIVE ng/mL
Marijuana Metabolite: NEGATIVE ng/mL
Morphine: NEGATIVE ng/mL
Nordiazepam: NEGATIVE ng/mL
Norhydrocodone: 1967 ng/mL
Opiates: POSITIVE ng/mL
Oxazepam: NEGATIVE ng/mL
Oxidant: NEGATIVE ug/mL
Oxycodone: NEGATIVE ng/mL
Temazepam: NEGATIVE ng/mL
pH: 5.8 (ref 4.5–9.0)

## 2019-06-15 ENCOUNTER — Other Ambulatory Visit: Payer: Self-pay | Admitting: Internal Medicine

## 2019-06-15 MED ORDER — HYDROCODONE-ACETAMINOPHEN 10-325 MG PO TABS: 1.0000 | ORAL_TABLET | Freq: Two times a day (BID) | ORAL | 0 refills | Status: DC

## 2019-06-15 NOTE — Telephone Encounter (Signed)
Wendy Rose [phone]   [phone] - [place], [place]  HYDROcodone-acetaminophen (Wendy Rose) 10-325 MG tablet   Wendy Rose called to say she needs refill on above medication.

## 2019-06-22 ENCOUNTER — Encounter: Payer: Self-pay | Admitting: Psychiatry

## 2019-06-22 ENCOUNTER — Other Ambulatory Visit: Payer: Self-pay

## 2019-06-22 ENCOUNTER — Ambulatory Visit (INDEPENDENT_AMBULATORY_CARE_PROVIDER_SITE_OTHER): Payer: PPO | Admitting: Psychiatry

## 2019-06-22 DIAGNOSIS — F329 Major depressive disorder, single episode, unspecified: Secondary | ICD-10-CM

## 2019-06-22 DIAGNOSIS — F5101 Primary insomnia: Secondary | ICD-10-CM | POA: Diagnosis not present

## 2019-06-22 DIAGNOSIS — F32A Depression, unspecified: Secondary | ICD-10-CM

## 2019-06-22 DIAGNOSIS — F419 Anxiety disorder, unspecified: Secondary | ICD-10-CM | POA: Diagnosis not present

## 2019-06-22 MED ORDER — BUPROPION HCL ER (XL) 300 MG PO TB24: 300.0000 mg | ORAL_TABLET | ORAL | 1 refills | Status: DC

## 2019-06-22 MED ORDER — ALPRAZOLAM 1 MG PO TABS: ORAL_TABLET | ORAL | 2 refills | Status: DC

## 2019-06-22 MED ORDER — SERTRALINE HCL 100 MG PO TABS: 200.0000 mg | ORAL_TABLET | Freq: Every day | ORAL | 0 refills | Status: DC

## 2019-06-22 NOTE — Progress Notes (Signed)
Wendy Rose Bard [id number] 01/03/1946 73 y.o.  Subjective:   Patient ID:  Wendy Rose is a 73 y.o. (DOB 12-06-1945) female.  Chief Complaint:  Chief Complaint  Patient presents with  . Follow-up    Anxiety, Depression, and insomnia  . Memory Loss    HPI Wendy Rose presents to the office today for follow-up of depression, anxiety, and insomnia. Pt reports that she has been having multiple falls. Fell 4 times in 3 weeks while staying with her niece in [place]. She reports, "I'm going to have to move... I think my legs are getting weaker." She reports that she becomes frustrated very easily. She reports that her niece has told her that she "over-thinks everything." She reports that she has severe anxiety when she cannot find things. Reports that she has been feeling more nervous and attributes this to physical decline. She reports sad mood. She reports that she has been unable to cry when taking SSRI's. Reports that she recently had a crying episode and felt better afterwards. Denies persistent sad mood, particularly when she is with her niece. She reports sadness in response to loss of independence and mobility. She reports occ anger and irritability.  Motivation is low. Reports that she frequently procrastinates. Low energy. She reports that she will doze during the day and continues to have difficulty sleeping at night at times. She reports that she continues to have periods of insomnia. Reports that she typically goes to bed after 12 midnight to 1 am. She reports losing 5 lbs and then gained it back. Reports appetite fluctuates. Denies SI. She reports that she is fearful about dying.   She has been trying to read and work word puzzles. She reports that sometimes she will have difficulty finding the word. Occ does not know how to pronounce a familiar word. She reports that she has difficulty with recent memory. Reports that she can vividly recall details from childhood.   Niece is very supportive.  Stayed with niece for 6 weeks and plans to spend the holidays with her.   She reports that she is taking Xanax mainly at HS.   Past Medication Trials: Abilify- wt gain Zyprexa- excessive sedation Seroquel- excessive sedation Prozac- took for years Sertraline Wellbutrin XL Xanax Ativan   Review of Systems:  Review of Systems  HENT: Positive for ear pain.   Musculoskeletal: Positive for arthralgias, back pain and gait problem.  Neurological: Positive for dizziness and weakness.  Psychiatric/Behavioral:       Please refer to HPI    Medications: I have reviewed the patient's current medications.  Current Outpatient Medications  Medication Sig Dispense Refill  . apixaban (ELIQUIS) 5 MG TABS tablet Take 1 tablet (5 mg total) by mouth 2 (two) times daily. 180 tablet 1  . atorvastatin (LIPITOR) 20 MG tablet TAKE 1 TABLET(20 MG) BY MOUTH DAILY AT 6 PM 30 tablet 6  . B Complex-C (B-COMPLEX WITH VITAMIN C) tablet Take 1 tablet by mouth daily.    . calcium-vitamin D (CALCIUM 500+D) 500-200 MG-UNIT per tablet Take 1 tablet by mouth 2 (two) times daily.     Marland Kitchen co-enzyme Q-10 50 MG capsule Take 50 mg by mouth 2 (two) times daily.    . Creatinine POWD Take by mouth as directed.     . hydrochlorothiazide (HYDRODIURIL) 12.5 MG tablet Take 12.5 mg by mouth daily.    Marland Kitchen HYDROcodone-acetaminophen (NORCO) 10-325 MG tablet Take 1 tablet by mouth every 12 (twelve) hours. 60 tablet 0  .  MAGNESIUM MALATE PO Take by mouth. Twice daily    . Melatonin 1 MG CAPS Take 1 mg by mouth at bedtime.    . pantoprazole (PROTONIX) 40 MG tablet TAKE 1 TABLET BY MOUTH ONCE DAILY 90 tablet 3  . ALPRAZolam (XANAX) 1 MG tablet Take 1/2 tab po QHS and 1/2-1 tab po qd prn 45 tablet 2  . buPROPion (WELLBUTRIN XL) 300 MG 24 hr tablet Take 1 tablet (300 mg total) by mouth every morning. 90 tablet 1  . Magnesium Oxide (MAG-200 PO) Take 1 tablet by mouth 2 (two) times daily.     Marland Kitchen MELATONIN PO Take by mouth.    . sertraline  (ZOLOFT) 100 MG tablet Take 2 tablets (200 mg total) by mouth daily. 180 tablet 0   No current facility-administered medications for this visit.     Medication Side Effects: None  Allergies:  Allergies  Allergen Reactions  . Contrast Media [Iodinated Diagnostic Agents] Shortness Of Breath    "difficulty breathing"  . Ciprofloxacin Itching    Pt stated itching   . Sulfa Antibiotics     Other reaction(s): Unknown  . Cefuroxime Axetil Rash    REACTION: Rash  . Codeine Other (See Comments)    REACTION: Reaction not known  . Naproxen Other (See Comments)    REACTION: Reaction not known  . Nitrofurantoin Rash    REACTION: Increase LFT's  . Sulfonamide Derivatives Other (See Comments)    REACTION: Reaction not known  . Sulindac Other (See Comments)    REACTION: Reaction not known    Past Medical History:  Diagnosis Date  . A-fib ([place])   . Anemia   . Anxiety   . Arthritis   . Chronic headaches   . Depression   . Fibromyalgia   . GERD (gastroesophageal reflux disease)   . History of echocardiogram    a. echo 02/2013: normal LV function with an EF of 55-60%, mild MR, mild TR, and PA peak pressure of 40 mmHg  . Hyperlipemia   . IBS (irritable bowel syndrome)   . MVP (mitral valve prolapse)   . Pneumonia   . PONV (postoperative nausea and vomiting)   . S/P cardiac pacemaker procedure, 02/16/13, Bio tronik device 02/19/2013  . Symptomatic bradycardia    s/p Biotronik Evia dual chamber pacemaker implant 02/16/2013 by Dr Rayann Heman  . UTI (lower urinary tract infection)   . Vertigo     Family History  Problem Relation Age of Onset  . Hypertension Mother   . Heart disease Father   . Alcohol abuse Father   . Diabetes Sister   . Alcohol abuse Sister   . Heart disease Brother   . Colon cancer Neg Hx   . Esophageal cancer Neg Hx   . Stomach cancer Neg Hx   . Rectal cancer Neg Hx     Social History   Socioeconomic History  . Marital status: Married    Spouse name: Not on  file  . Number of children: Not on file  . Years of education: Not on file  . Highest education level: Not on file  Occupational History  . Not on file  Social Needs  . Financial resource strain: Not on file  . Food insecurity    Worry: Not on file    Inability: Not on file  . Transportation needs    Medical: Not on file    Non-medical: Not on file  Tobacco Use  . Smoking status: Never Smoker  .  Smokeless tobacco: Never Used  Substance and Sexual Activity  . Alcohol use: No    Alcohol/week: 0.0 standard drinks  . Drug use: No  . Sexual activity: Yes  Lifestyle  . Physical activity    Days per week: Not on file    Minutes per session: Not on file  . Stress: Not on file  Relationships  . Social Herbalist on phone: Not on file    Gets together: Not on file    Attends religious service: Not on file    Active member of club or organization: Not on file    Attends meetings of clubs or organizations: Not on file    Relationship status: Not on file  . Intimate partner violence    Fear of current or ex partner: Not on file    Emotionally abused: Not on file    Physically abused: Not on file    Forced sexual activity: Not on file  Other Topics Concern  . Not on file  Social History Narrative  . Not on file    Past Medical History, Surgical history, Social history, and Family history were reviewed and updated as appropriate.   Please see review of systems for further details on the patient's review from today.   Objective:   Physical Exam:  BP (!) 160/79   Pulse 72   Physical Exam Constitutional:      General: She is not in acute distress.    Appearance: She is well-developed.  Musculoskeletal:        General: No deformity.  Neurological:     Mental Status: She is alert and oriented to person, place, and time.     Coordination: Coordination normal.  Psychiatric:        Attention and Perception: Attention and perception normal. She does not perceive  auditory or visual hallucinations.        Mood and Affect: Mood is anxious. Mood is not depressed. Affect is not labile, blunt, angry or inappropriate.        Speech: Speech normal.        Behavior: Behavior normal.        Thought Content: Thought content normal. Thought content does not include homicidal or suicidal ideation. Thought content does not include homicidal or suicidal plan.        Cognition and Memory: Cognition and memory normal.        Judgment: Judgment normal.     Comments: Insight intact. No delusions.      Lab Review:     Component Value Date/Time   NA 140 03/07/2019 1031   NA 138 07/28/2018 1437   K 4.4 03/07/2019 1031   CL 103 03/07/2019 1031   CO2 26 03/07/2019 1031   GLUCOSE 90 03/07/2019 1031   BUN 18 03/07/2019 1031   BUN 19 07/28/2018 1437   CREATININE 0.71 03/07/2019 1031   CALCIUM 9.2 03/07/2019 1031   PROT 6.2 03/07/2019 1031   ALBUMIN 3.7 05/06/2017 2041   AST 24 03/07/2019 1031   ALT 21 03/07/2019 1031   ALKPHOS 68 05/06/2017 2041   BILITOT 0.5 03/07/2019 1031   GFRNONAA 84 03/07/2019 1031   GFRAA 97 03/07/2019 1031       Component Value Date/Time   WBC 7.1 03/07/2019 1031   RBC 4.66 03/07/2019 1031   HGB 13.0 03/07/2019 1031   HCT 39.1 03/07/2019 1031   PLT 219 03/07/2019 1031   MCV 83.9 03/07/2019 1031   MCH 27.9  03/07/2019 1031   MCHC 33.2 03/07/2019 1031   RDW 13.6 03/07/2019 1031   LYMPHSABS 2,308 03/07/2019 1031   MONOABS 0.9 05/06/2017 2041   EOSABS 156 03/07/2019 1031   BASOSABS 43 03/07/2019 1031    No results found for: POCLITH, LITHIUM   No results found for: PHENYTOIN, PHENOBARB, VALPROATE, CBMZ   .res Assessment: Plan:   Patient seen for 45 minutes and greater than 50% of visit spent counseling patient and coordinating care, to include her concerns about possible cognitive impairment and performing MMSE.  Discussed that she scored a 30 out of 30 on her Mini-Mental status exam which therefore does not indicate  cognitive impairment.  Discussed that Xanax has been associated with some increased risk of cognitive impairment and also could potentially increased risk of falls, and therefore recommend using lowest possible effective dose.  Encourage patient to only take one half tab Xanax at night if possible. Will continue Zoloft for treatment of anxiety and depression. Continue Wellbutrin XL for treatment of depression. Patient to follow-up in 3 months or sooner if clinically indicated. Patient advised to contact office with any questions, adverse effects, or acute worsening in signs and symptoms. Patient advised to contact office with any questions, adverse effects, or acute worsening in signs and symptoms.  Jannine was seen today for follow-up and memory loss.  Diagnoses and all orders for this visit:  Anxiety -     ALPRAZolam (XANAX) 1 MG tablet; Take 1/2 tab po QHS and 1/2-1 tab po qd prn -     sertraline (ZOLOFT) 100 MG tablet; Take 2 tablets (200 mg total) by mouth daily.  Primary insomnia -     ALPRAZolam (XANAX) 1 MG tablet; Take 1/2 tab po QHS and 1/2-1 tab po qd prn  Depression, unspecified depression type -     buPROPion (WELLBUTRIN XL) 300 MG 24 hr tablet; Take 1 tablet (300 mg total) by mouth every morning. -     sertraline (ZOLOFT) 100 MG tablet; Take 2 tablets (200 mg total) by mouth daily.     Please see After Visit Summary for patient specific instructions.  Future Appointments  Date Time Provider [place]  07/18/2019  8:00 AM [facility] DEVICE REMOTES [facility]  09/22/2019  1:00 PM Thayer Headings, PMHNP CP-CP None  10/12/2019  1:30 PM Baird Lyons D, MD [facility] None  10/17/2019  8:00 AM [facility] DEVICE REMOTES [facility]  01/16/2020  8:00 AM [facility] DEVICE REMOTES [facility]  02/15/2020 12:45 PM Hayden Pedro, MD TRE-TRE None  04/16/2020  8:00 AM [facility] DEVICE REMOTES [facility]  07/16/2020  8:00 AM  [facility] DEVICE REMOTES [facility]    No orders of the defined types were placed in this encounter.   -------------------------------

## 2019-06-25 DIAGNOSIS — R0902 Hypoxemia: Secondary | ICD-10-CM | POA: Diagnosis not present

## 2019-06-25 NOTE — Progress Notes (Signed)
Subjective:    Patient ID: Wendy Rose, female    DOB: 1945-11-09, 73 y.o.   MRN: [id number]  HPI 73 year old Female in today for chronic pain management.  Longstanding history of anxiety depression and insomnia.  Reports several falls recently.  Says her legs are getting weaker.  History of mitochondrial myopathy.  Has not been back to [facility] to see neurologist recently.  Feeling more anxious and nervous about her physical decline.  Realizes it is hard to live alone and is going to [place] to stay with her niece.  Seems to be lonely up here.  Continues to have issues with insomnia but is sleeping some during the day.  She ambulates slowly.  Staff went to help her to her car.  Her walker is heavy to lift per staff member who assisted her to car.  Psychiatry has her on Xanax and Zoloft as well as Wellbutrin.  It is concerning because she is on Eliquis but is having multiple falls.  She likely needs more help in the home and may be going to stay with her niece for several weeks is a good idea.  She may need to look into moving down there permanently.  Does not feel that she has anyone here she can rely on to assist her.  Does not want to get home health in her home.  Encouraged her to discuss her concerns with psychiatry at next visit November 12.  She has some hard decisions to make in the near future.  I do not see her staying alone if she is continuing to fall on anticoagulation therapy.  I am concerned about her driving as well if she feels her legs are weaker.  She is here today for chronic pain management.  She has always taken her narcotic pain medication responsibly.  Longstanding history of fibromyalgia syndrome.  Used to go to physical therapy for gait and balance strengthening but has not been in some time.  Chronic musculoskeletal pain treated with Norco.  Says she needs to clean out her home but does not feel up to it physically.  Had similar complaints in January.  Has grief reaction.  Her  husband is deceased.  Urine drug screen obtained and results reviewed.    Review of Systems  Constitutional: Positive for fatigue.  Respiratory: Positive for shortness of breath.        Pulmonologist has seen her and thinks that shortness of breath is more related to leg weakness than hypoxemia  Cardiovascular: Negative.   Gastrointestinal: Negative.   Genitourinary: Negative.   Musculoskeletal: Positive for arthralgias, back pain, gait problem and myalgias.  Neurological: Positive for weakness.       Lower extremity weakness  Psychiatric/Behavioral: Positive for dysphoric mood and sleep disturbance. The patient is nervous/anxious.         Objective:   Physical Exam Has lost 2 pounds since July. VS reviewed and stable. No hypoxemia on os Sat. Dysphoric mood and somewhat anxious about her future plans.  Neck is supple.  Chest clear to auscultation.  Cardiac exam regular rate and rhythm.  No lower extremity edema.  Alert and oriented x3.  Difficulty ambulating.  Walks slowly with walker.      Assessment & Plan:  Lower extremity weakness due to mitochondrial myopathy.  Likely would benefit from physical therapy but does not want to do that at present time because she is going to be spending several weeks with her niece in [place].  Discussion  with patient about long-term plan for her care.  She may not be able to continue to reside alone without some help.  She does not seem to want to make any decisions today.  She might move to [place] to be near her niece.  She does not feel that there are other relatives she can rely on at present time.  25 minutes spent with patient reviewing her situation.  Drug screen obtained and reviewed.  She takes narcotic pain medication responsibly.  Follow-up in 3 months.

## 2019-06-25 NOTE — Patient Instructions (Signed)
Urine drug screen obtained.  Continue with counseling.  Suggest physical therapy for gait strengthening.  Follow-up in 3 months.  No change in medication regimen.

## 2019-07-18 ENCOUNTER — Ambulatory Visit (INDEPENDENT_AMBULATORY_CARE_PROVIDER_SITE_OTHER): Payer: PPO | Admitting: *Deleted

## 2019-07-18 DIAGNOSIS — I495 Sick sinus syndrome: Secondary | ICD-10-CM | POA: Diagnosis not present

## 2019-07-19 LAB — CUP PACEART REMOTE DEVICE CHECK
Date Time Interrogation Session: 20201208102324
Implantable Lead Implant Date: 20140710
Implantable Lead Implant Date: 20140710
Implantable Lead Location: 753859
Implantable Lead Location: 753860
Implantable Lead Model: 346
Implantable Lead Model: 346
Implantable Lead Serial Number: 29334372
Implantable Lead Serial Number: 29426855
Implantable Pulse Generator Implant Date: 20140710
Pulse Gen Serial Number: 66385898

## 2019-07-20 ENCOUNTER — Other Ambulatory Visit: Payer: Self-pay | Admitting: Internal Medicine

## 2019-07-20 MED ORDER — HYDROCODONE-ACETAMINOPHEN 10-325 MG PO TABS: 1.0000 | ORAL_TABLET | Freq: Two times a day (BID) | ORAL | 0 refills | Status: DC

## 2019-07-20 NOTE — Telephone Encounter (Signed)
Last refill 06/15/2019.

## 2019-07-20 NOTE — Telephone Encounter (Signed)
Wendy Rose  [phone]  Shana called to say she needs refill on below medication sent to pharmacy.  HYDROcodone-acetaminophen ([place]) 10-325 MG tablet  [facility] [phone] - [place], [address] [place] AT [address] Phone:  [phone]  Fax:  [phone]

## 2019-07-25 DIAGNOSIS — R0902 Hypoxemia: Secondary | ICD-10-CM | POA: Diagnosis not present

## 2019-08-14 ENCOUNTER — Other Ambulatory Visit: Payer: Self-pay

## 2019-08-14 MED ORDER — HYDROCHLOROTHIAZIDE 12.5 MG PO TABS: 12.5000 mg | ORAL_TABLET | Freq: Every day | ORAL | 0 refills | Status: DC

## 2019-08-23 ENCOUNTER — Other Ambulatory Visit: Payer: Self-pay | Admitting: Internal Medicine

## 2019-08-23 NOTE — Telephone Encounter (Signed)
Wendy Rose [phone]  Wendy Rose called to say she needs refill on below medication.  HYDROcodone-acetaminophen ([place]) 10-325 MG tablet   [facility] [phone] - [place], [place] AT [address] Phone:  [phone]  Fax:  [phone]

## 2019-08-24 MED ORDER — HYDROCODONE-ACETAMINOPHEN 10-325 MG PO TABS: 1.0000 | ORAL_TABLET | Freq: Two times a day (BID) | ORAL | 0 refills | Status: DC

## 2019-08-25 DIAGNOSIS — R0902 Hypoxemia: Secondary | ICD-10-CM | POA: Diagnosis not present

## 2019-08-28 ENCOUNTER — Telehealth: Payer: Self-pay | Admitting: Internal Medicine

## 2019-08-28 NOTE — Telephone Encounter (Signed)
Received Fax RX request from  [facility] [phone] [address] [place], [place] Phone:  [phone]  Fax:  [phone]       Medication - pantoprazole (PROTONIX) 40 MG tablet   Last Refill - 08/17/2019  Last OV - 06/12/19  Last CPE - 03/10/19  Next Appointment - 09/15/2019

## 2019-08-29 MED ORDER — PANTOPRAZOLE SODIUM 40 MG PO TBEC: 40.0000 mg | DELAYED_RELEASE_TABLET | Freq: Every day | ORAL | 3 refills | Status: DC

## 2019-09-15 ENCOUNTER — Encounter: Payer: Self-pay | Admitting: Internal Medicine

## 2019-09-15 ENCOUNTER — Other Ambulatory Visit: Payer: Self-pay

## 2019-09-15 ENCOUNTER — Ambulatory Visit (INDEPENDENT_AMBULATORY_CARE_PROVIDER_SITE_OTHER): Payer: PPO | Admitting: Internal Medicine

## 2019-09-15 VITALS — BP 120/80 | HR 85 | Temp 98.0°F | Ht 68.0 in | Wt 176.0 lb

## 2019-09-15 DIAGNOSIS — J309 Allergic rhinitis, unspecified: Secondary | ICD-10-CM | POA: Diagnosis not present

## 2019-09-15 DIAGNOSIS — M797 Fibromyalgia: Secondary | ICD-10-CM | POA: Diagnosis not present

## 2019-09-15 DIAGNOSIS — G894 Chronic pain syndrome: Secondary | ICD-10-CM | POA: Diagnosis not present

## 2019-09-15 MED ORDER — HYDROCODONE-ACETAMINOPHEN 10-325 MG PO TABS: 1.0000 | ORAL_TABLET | Freq: Two times a day (BID) | ORAL | 0 refills | Status: DC

## 2019-09-15 MED ORDER — BUPROPION HCL ER (XL) 300 MG PO TB24: 300.0000 mg | ORAL_TABLET | Freq: Every day | ORAL | 2 refills | Status: AC

## 2019-09-15 MED ORDER — SERTRALINE HCL 100 MG PO TABS: 100.0000 mg | ORAL_TABLET | Freq: Every day | ORAL | 2 refills | Status: DC

## 2019-09-15 MED ORDER — METHYLPREDNISOLONE ACETATE 80 MG/ML IJ SUSP
80.0000 mg | Freq: Once | INTRAMUSCULAR | Status: AC
  Administered 2019-09-15: 80 mg via INTRAMUSCULAR

## 2019-09-15 MED ORDER — PANTOPRAZOLE SODIUM 40 MG PO TBEC: 40.0000 mg | DELAYED_RELEASE_TABLET | Freq: Every day | ORAL | 3 refills | Status: AC
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
## 2019-09-17 LAB — PAIN MGMT, PROFILE 8 W/CONF, U
6 Acetylmorphine: NEGATIVE ng/mL
Alcohol Metabolites: NEGATIVE ng/mL (ref <500)
Alphahydroxyalprazolam: 131 ng/mL
Alphahydroxymidazolam: NEGATIVE ng/mL
Alphahydroxytriazolam: NEGATIVE ng/mL
Aminoclonazepam: NEGATIVE ng/mL
Amphetamines: NEGATIVE ng/mL
Benzodiazepines: POSITIVE ng/mL
Buprenorphine, Urine: NEGATIVE ng/mL
Cocaine Metabolite: NEGATIVE ng/mL
Codeine: NEGATIVE ng/mL
Creatinine: 67.8 mg/dL
Hydrocodone: 525 ng/mL
Hydromorphone: NEGATIVE ng/mL
Hydroxyethylflurazepam: NEGATIVE ng/mL
Lorazepam: NEGATIVE ng/mL
MDMA: NEGATIVE ng/mL
Marijuana Metabolite: NEGATIVE ng/mL
Morphine: NEGATIVE ng/mL
Nordiazepam: NEGATIVE ng/mL
Norhydrocodone: 2054 ng/mL
Opiates: POSITIVE ng/mL
Oxazepam: NEGATIVE ng/mL
Oxidant: NEGATIVE ug/mL
Oxycodone: NEGATIVE ng/mL
Temazepam: NEGATIVE ng/mL
pH: 6.9 (ref 4.5–9.0)

## 2019-09-18 ENCOUNTER — Telehealth: Payer: Self-pay | Admitting: Internal Medicine

## 2019-09-18 NOTE — Telephone Encounter (Signed)
This is the maximum dose for this medication. Perhaps they can mail it to her?

## 2019-09-18 NOTE — Telephone Encounter (Signed)
Wendy Rose [phone]  HYDROcodone-acetaminophen [facility]) 10-325 MG tablet   [name] called to see if she could get a prescription sent to pharmacy increasing her dose, so that she can get it filled early in [place] today before she goes back to [place]. She stated that pharmacy said she could not get it until 09/27/2019 and she is not able to come back to pick up then.

## 2019-09-18 NOTE — Telephone Encounter (Signed)
Called and let patient know what Dr Baxley said, she verbalized understanding 

## 2019-09-22 ENCOUNTER — Ambulatory Visit: Payer: PPO | Admitting: Psychiatry

## 2019-09-25 DIAGNOSIS — R0902 Hypoxemia: Secondary | ICD-10-CM | POA: Diagnosis not present

## 2019-09-30 NOTE — Patient Instructions (Signed)
Medications refilled as requested.  Medicare wellness visit and health maintenance exam due in 6 months.

## 2019-10-12 ENCOUNTER — Ambulatory Visit: Payer: PPO | Admitting: Internal Medicine

## 2019-10-17 ENCOUNTER — Ambulatory Visit (INDEPENDENT_AMBULATORY_CARE_PROVIDER_SITE_OTHER): Payer: PPO | Admitting: *Deleted

## 2019-10-17 DIAGNOSIS — I495 Sick sinus syndrome: Secondary | ICD-10-CM | POA: Diagnosis not present

## 2019-10-18 LAB — CUP PACEART REMOTE DEVICE CHECK
Date Time Interrogation Session: 20210309171604
Implantable Lead Implant Date: 20140710
Implantable Lead Implant Date: 20140710
Implantable Lead Location: 753859
Implantable Lead Location: 753860
Implantable Lead Model: 346
Implantable Lead Model: 346
Implantable Lead Serial Number: 29334372
Implantable Lead Serial Number: 29426855
Implantable Pulse Generator Implant Date: 20140710
Pulse Gen Serial Number: 66385898

## 2019-10-18 NOTE — Progress Notes (Signed)
PPM Remote  

## 2019-10-19 ENCOUNTER — Telehealth: Payer: Self-pay | Admitting: Internal Medicine

## 2019-10-19 NOTE — Telephone Encounter (Signed)
Wendy Rose [phone]  Heike called to inquire about the below medication, she stated that she had been taking 2 of them , but the new prescription was for only one.   sertraline (ZOLOFT) 100 MG tablet  [place], [address] AT [address] Phone:  [phone]  Fax:  [phone]

## 2019-10-23 ENCOUNTER — Telehealth (INDEPENDENT_AMBULATORY_CARE_PROVIDER_SITE_OTHER): Payer: Self-pay | Admitting: Internal Medicine

## 2019-10-23 DIAGNOSIS — R0902 Hypoxemia: Secondary | ICD-10-CM | POA: Diagnosis not present

## 2019-10-23 DIAGNOSIS — F324 Major depressive disorder, single episode, in partial remission: Secondary | ICD-10-CM

## 2019-10-23 MED ORDER — SERTRALINE HCL 100 MG PO TABS: ORAL_TABLET | ORAL | 1 refills | Status: DC

## 2019-10-23 MED ORDER — SERTRALINE HCL 100 MG PO TABS: ORAL_TABLET | ORAL | 1 refills | Status: AC

## 2019-10-23 NOTE — Telephone Encounter (Signed)
Patient says pharmacy does not have correct dosage on Zoloft. This has previously been prescribed by psychiatrist as Zoloft 100 mg 2 tabs daily.   We thought we had taken care of this issue last week. She says she has just called and they do not have it.  I am calling in the Rx once again to pharmacy. Zoloft 100 mg #180- 2 tabs daily with no refill  This is being sent to pharmacy in [place] per pt request.

## 2019-11-03 ENCOUNTER — Other Ambulatory Visit: Payer: Self-pay

## 2019-11-03 ENCOUNTER — Telehealth: Payer: Self-pay | Admitting: Internal Medicine

## 2019-11-03 ENCOUNTER — Encounter: Payer: Self-pay | Admitting: Internal Medicine

## 2019-11-03 ENCOUNTER — Ambulatory Visit (INDEPENDENT_AMBULATORY_CARE_PROVIDER_SITE_OTHER): Payer: PPO

## 2019-11-03 ENCOUNTER — Ambulatory Visit: Payer: PPO | Admitting: Internal Medicine

## 2019-11-03 VITALS — BP 122/76 | HR 80 | Temp 97.0°F | Ht 68.0 in | Wt 179.0 lb

## 2019-11-03 DIAGNOSIS — R0609 Other forms of dyspnea: Secondary | ICD-10-CM

## 2019-11-03 DIAGNOSIS — G713 Mitochondrial myopathy, not elsewhere classified: Secondary | ICD-10-CM | POA: Diagnosis not present

## 2019-11-03 DIAGNOSIS — R0600 Dyspnea, unspecified: Secondary | ICD-10-CM

## 2019-11-03 DIAGNOSIS — J9611 Chronic respiratory failure with hypoxia: Secondary | ICD-10-CM

## 2019-11-03 NOTE — Progress Notes (Signed)
Patient identification verified. Patient provided recent results of chest x ray. Per Dr. Annamaria Boots, x ray shows no acute process, pacemaker is in place, no pneumonia or heart failure seen. Patient verbalized understanding of results.

## 2019-11-03 NOTE — Telephone Encounter (Signed)
Called [facility] and the office is closed- call back on 3/29

## 2019-11-03 NOTE — Assessment & Plan Note (Signed)
Muscle weakness progressive.

## 2019-11-03 NOTE — Patient Instructions (Addendum)
Order- Walk test on room air today desaturated to 88%, max heart rate 115.  Order DME [facility]- change O2 order to 2L/ min continuous and light portable for dx chronic respiratory failure with hypoxia.  Order- CXR dx Dyspnea on exertion   Please call as needed

## 2019-11-03 NOTE — Assessment & Plan Note (Signed)
She now needs to add portable O2 2l. One established with provider in [place] she may want to decide if she wants POC.

## 2019-11-03 NOTE — Progress Notes (Signed)
HPI F never smoker followed for SOB and cough, complicated by A. Fib/ pacemaker, , headaches, depression, fibromyalgia, GERD, IBS, mitochondrial myopathy Husband Mikki Santee was patient here with severe asthma/COPD, expired Walk Test on room air 02/28/2018-maximum heart rate 115, lowest saturation 96% PFT 05/30/2018-moderate diffusion defect with normal flows and lung volumes, no response to dilator.  FVC 3.91/110%, FEV1 3.36/125%, ratio 1.86, TLC 99%, DLCO 61% Walk Test on room air 11/03/19- desat to 88%, max HR 115. ---------------------------------------------------------------------    04/13/19-73 yoFnever smoker followed for SOB and cough, Chronic Hypoxic Respiratory Failure, complicated by A. Fib/ pacemaker,, headaches, depression, fibromyalgia, GERD, IBS, mitochondrial myopathy, Insomnia O2  2 L sleep/[facility] after abnormal overnight oximetry. -----pt states she has increased shortness of breath with activity; denies acute symptoms; pt states her sleep has varied, reports getting about 5-6 hours of sleep, sometimes 3 hours sometimes no sleep Arrival room air sat 98%, Body weight 176 lbs For sleep uses melatonin and occ 1/2 tab xanax Hemoglobin 13 03/07/19 Realizes most of her problems are related to her myopathy. Staying at home. Covid careful.   11/03/19-  -73 yoFnever smoker followed for SOB and cough, Chronic Hypoxic Respiratory Failure, Pulmonary Hypertension, complicated by A. Fib/ Pacemaker, headaches, depression, fibromyalgia, GERD, IBS, Mitochondrial Myopathy, Insomnia O2  2 L sleep/[facility] after abnormal overnight oximetry. -----f/u Pulmonary hypertension. DOE. O2 2l/min at night Niece here today. No Covax yet. Now moving to [place] to live with niece. Has not established medical care there yet- discussed. Frequent dry cough managed w otc cough syrup and antihistamines. . Weakness and dyspnea- using rolling walker. Walk Test on room air 11/03/19- desat to 88%, max HR 115.  ROS-see HPI    + = positive Constitutional:    weight loss, night sweats, fevers, chills, fatigue, lassitude. HEENT:    headaches, difficulty swallowing, tooth/dental problems, sore throat,       sneezing, itching, ear ache, nasal congestion, + post nasal drip, snoring CV:    chest pain, orthopnea, PND, swelling in lower extremities, anasarca,                                                  dizziness, palpitations Resp:   +shortness of breath with exertion or at rest.                productive cough,  + non-productive cough, coughing up of blood.              change in color of mucus.  wheezing.   Skin:    rash or lesions. GI:  + heartburn, indigestion, abdominal pain, nausea, vomiting, diarrhea,                 change in bowel habits, loss of appetite GU: dysuria, change in color of urine, no urgency or frequency.   flank pain. MS:   joint pain, stiffness, decreased range of motion, back pain. Neuro-     +unsteady Psych:  change in mood or affect.  depression or anxiety.   memory loss.  OBJ- Physical Exam General- Alert, Oriented, Affect-appropriate, Distress- none acute Skin- rash-none, lesions- none, excoriation- none Lymphadenopathy- none Head- atraumatic            Eyes- Gross vision intact, PERRLA, conjunctivae and secretions clear            Ears- Hearing, canals-normal  Nose- +sniffing, Clear, no-Septal dev, mucus, polyps, erosion, perforation             Throat- Mallampati II , mucosa clear , drainage- none, tonsils- atrophic Neck- flexible , trachea midline, no stridor , thyroid nl, carotid no bruit Chest - symmetrical excursion , unlabored           Heart/CV- RRR , no murmur , no gallop  , no rub, nl s1 s2                           - JVD- none , edema- none, stasis changes- none, varices- none           Lung- clear to P&A, wheeze- none, cough- none at this visit , dullness-none, rub- none           Chest wall-+ pacemaker left Abd-  Br/ Gen/ Rectal- Not done, not  indicated Extrem- cyanosis- none, clubbing, none, atrophy- none, strength +rolling walker Neuro- grossly intact to observation

## 2019-11-06 NOTE — Telephone Encounter (Signed)
Wendy Rose, pt was walked last week when seeing CY. Can you update the pt's [facility] note. Triage can call [facility] once updated. Thanks.

## 2019-11-08 ENCOUNTER — Other Ambulatory Visit: Payer: Self-pay

## 2019-11-08 MED ORDER — HYDROCODONE-ACETAMINOPHEN 10-325 MG PO TABS: 1.0000 | ORAL_TABLET | Freq: Two times a day (BID) | ORAL | 0 refills | Status: AC

## 2019-11-08 NOTE — Telephone Encounter (Signed)
Patient called to request a refill on [facility]. Last refill 09/15/2019 #60

## 2019-11-09 ENCOUNTER — Other Ambulatory Visit: Payer: Self-pay

## 2019-11-09 DIAGNOSIS — J9611 Chronic respiratory failure with hypoxia: Secondary | ICD-10-CM

## 2019-11-09 DIAGNOSIS — R0609 Other forms of dyspnea: Secondary | ICD-10-CM

## 2019-11-09 NOTE — Addendum Note (Signed)
Addended by: June Leap on: 11/09/2019 09:12 AM   Modules accepted: Orders

## 2019-11-09 NOTE — Telephone Encounter (Signed)
Updated Saturation Qualification for oxygen  Put in a new order for [facility] for co2 2L/MIN for patient . Nothing else further needed at this time.Will close encounter.

## 2019-11-09 NOTE — Addendum Note (Signed)
Addended by: June Leap on: 11/09/2019 09:54 AM   Modules accepted: Orders

## 2019-11-15 NOTE — Progress Notes (Signed)
Virtual Visit via Telephone Note   This visit type was conducted due to national recommendations for restrictions regarding the COVID-19 Pandemic (e.g. social distancing) in an effort to limit this patient's exposure and mitigate transmission in our community.  Due to her co-morbid illnesses, this patient is at least at moderate risk for complications without adequate follow up.  This format is felt to be most appropriate for this patient at this time.  The patient did not have access to video technology/had technical difficulties with video requiring transitioning to audio format only (telephone).  All issues noted in this document were discussed and addressed.  No physical exam could be performed with this format.  Please refer to the patient's chart for her  consent to telehealth for [facility].   The patient was identified using 2 identifiers.  Date:  11/16/2019   ID:  ICIS STRATIS, DOB 08-02-1946, MRN [id number]  Patient Location: Home Provider Location: Office  PCP:  Elby Showers, MD  Cardiologist:  Shelva Majestic, MD  Electrophysiologist:  Thompson Grayer, MD   Evaluation Performed:  Follow-Up Visit  Chief Complaint:  Routine follow-up  History of Present Illness:    Wendy Rose is a 74 y.o. female with PMH of paroxysmal atrial flutter, SSS s/p PPM placement in 2014, LE edema, HLD, atypical chest pain, nocturnal hypoxia on home O2 qHS, mitochondrial myopathy followed by Neurology at [facility], GERD, and fibromyalgia, who presents for routine follow-up.  She was last evaluated by cardiology at an outpatient visit with Dr. Claiborne Billings 10/10/2018, at which time she reported chronic DOE and rubor on dependency, as well as chronic back and leg pain/weakness. No medication changes occurred at this visit and she was recommended to follow-up in 1 year. Her last echocardiogram 06/2018 Showed EF 55-60%, indeterminate LV diastolic function, no RWMA, moderate biatrial enlargement, mild MR/TR, and mildly to  moderately elevated PA pressures. Her last ischemic evaluation was a NST in 2014 which was without reversible ischemia and a small defect c/w pacing related artifact. Her last device interrogation 10/2019 showed normal device function; V paced 99%.   She presents today for routine follow-up. She has been doing fairly well from a cardiac standpoint. She has chronic DOE which is unchanged in recent months. She has had a couple mechanical falls since her last visit and has decided to move to [place], [place] to be closer to family. Her LE edema has been well controlled on HCTZ. She has no complaints of chest pain, SOB at rest, palpitations, dizziness, lightheadedness, hematuria, hematochezia, or melena. She is hopeful to get her COVID vaccine in the coming weeks.   The patient does not have symptoms concerning for COVID-19 infection (fever, chills, cough, or new shortness of breath).     Past Medical History:  Diagnosis Date  . A-fib ([place])   . Anemia   . Anxiety   . Arthritis   . Chronic headaches   . Depression   . Fibromyalgia   . GERD (gastroesophageal reflux disease)   . History of echocardiogram    a. echo 02/2013: normal LV function with an EF of 55-60%, mild MR, mild TR, and PA peak pressure of 40 mmHg  . Hyperlipemia   . IBS (irritable bowel syndrome)   . MVP (mitral valve prolapse)   . Pneumonia   . PONV (postoperative nausea and vomiting)   . S/P cardiac pacemaker procedure, 02/16/13, Bio tronik device 02/19/2013  . Symptomatic bradycardia    s/p Biotronik Evia dual  chamber pacemaker implant 02/16/2013 by Dr Rayann Heman  . UTI (lower urinary tract infection)   . Vertigo    Past Surgical History:  Procedure Laterality Date  . BLADDER SUSPENSION    . CESAREAN SECTION    . DILATION AND CURETTAGE OF UTERUS    . PACEMAKER INSERTION  02/16/2013   Biotronik Evia dual chamber pacemaker implanted by Dr Rayann Heman  . PERMANENT PACEMAKER INSERTION N/A 02/16/2013   Procedure: PERMANENT PACEMAKER  INSERTION;  Surgeon: Thompson Grayer, MD;  Location: [facility] CATH LAB;  Service: Cardiovascular;  Laterality: N/A;  . TONSILLECTOMY AND ADENOIDECTOMY    . TOTAL ABDOMINAL HYSTERECTOMY    . TYMPANOPLASTY       Current Meds  Medication Sig  . ALPRAZolam (XANAX) 1 MG tablet Take 1/2 tab po QHS and 1/2-1 tab po qd prn  . apixaban (ELIQUIS) 5 MG TABS tablet Take 1 tablet (5 mg total) by mouth 2 (two) times daily.  Marland Kitchen atorvastatin (LIPITOR) 20 MG tablet TAKE 1 TABLET(20 MG) BY MOUTH DAILY AT 6 PM  . B Complex-C (B-COMPLEX WITH VITAMIN C) tablet Take 1 tablet by mouth daily.  Marland Kitchen buPROPion (WELLBUTRIN XL) 300 MG 24 hr tablet Take 1 tablet (300 mg total) by mouth daily.  . calcium-vitamin D (CALCIUM 500+D) 500-200 MG-UNIT per tablet Take 1 tablet by mouth 2 (two) times daily.   Marland Kitchen co-enzyme Q-10 50 MG capsule Take 50 mg by mouth 2 (two) times daily.  . Creatinine POWD Take by mouth as directed.   . hydrochlorothiazide (HYDRODIURIL) 12.5 MG tablet Take 1 tablet (12.5 mg total) by mouth daily.  Marland Kitchen HYDROcodone-acetaminophen (NORCO) 10-325 MG tablet Take 1 tablet by mouth every 12 (twelve) hours.  Marland Kitchen MAGNESIUM MALATE PO Take by mouth. Twice daily  . Melatonin 1 MG CAPS Take 1 mg by mouth at bedtime.  . pantoprazole (PROTONIX) 40 MG tablet Take 1 tablet (40 mg total) by mouth daily.  . sertraline (ZOLOFT) 100 MG tablet Take 2 tablets by mouth daily.     Allergies:   Contrast media [iodinated diagnostic agents], Sulfa antibiotics, Sulfonamide derivatives, Ciprofloxacin, Cefuroxime axetil, Codeine, Naproxen, Nitrofurantoin, and Sulindac   Social History   Tobacco Use  . Smoking status: Never Smoker  . Smokeless tobacco: Never Used  Substance Use Topics  . Alcohol use: No    Alcohol/week: 0.0 standard drinks  . Drug use: No     Family Hx: The patient's family history includes Alcohol abuse in her father and sister; Diabetes in her sister; Heart disease in her brother and father; Hypertension in her mother.  There is no history of Colon cancer, Esophageal cancer, Stomach cancer, or Rectal cancer.  ROS:   Please see the history of present illness.     All other systems reviewed and are negative.   Prior CV studies:   The following studies were reviewed today:  Echocardiogram 06/2018: Study Conclusions  - Left ventricle: The cavity size was normal. Wall thickness was normal. Systolic function was normal. The estimated ejection fraction was in the range of 55% to 60%. Wall motion was normal; there were no regional wall motion abnormalities. Indeterminate diastolic function. - Aortic valve: Transvalvular velocity was within the normal range. There was no stenosis. There was no regurgitation. Mean gradient (S): 4 mm Hg. Valve area (VTI): 2.7 cm^2. Valve area (Vmax): 2.92 cm^2. Valve area (Vmean): 2.48 cm^2. - Mitral valve: Transvalvular velocity was within the normal range. There was no evidence for stenosis. There was mild regurgitation. - Left  atrium: The atrium was moderately dilated. - Right ventricle: The cavity size was normal. Wall thickness was normal. Pacer wire or catheter noted in right ventricle. Systolic function was normal. RV systolic pressure (S, est): 42 mm Hg. - Right atrium: The atrium was moderately dilated. - Tricuspid valve: There was mild regurgitation. - Pulmonic valve: There was trivial regurgitation. - Pulmonary arteries: Systolic pressure was mildly to moderately increased. - Inferior vena cava: The vessel was normal in size. The respirophasic diameter changes were blunted (<50%).  NST 2014: Impression Exercise Capacity:  Lexiscan with no exercise. BP Response:  Normal blood pressure response. Clinical Symptoms:  No significant symptoms noted. ECG Impression:  No significant ECG changes with Lexiscan. Comparison with Prior Nuclear Study: No previous nuclear study performed  Overall Impression:  Small fixed apical septal defect  may a pacing related artifact. Otherwise normal perfusion pattern. No reversible ischemia. This is a low risk study.  LV Wall Motion:  There is paradoxical septal motion and apical dyssynchrony, pacing related. Normal overall LV systolic function.  Labs/Other Tests and Data Reviewed:    EKG:  An ECG dated 10/10/2018 was personally reviewed today and demonstrated:  V paced rhythm, rate 79 bpm  Recent Labs: 03/07/2019: ALT 21; BUN 18; Creat 0.71; Hemoglobin 13.0; Platelets 219; Potassium 4.4; Sodium 140; TSH 1.66   Recent Lipid Panel Lab Results  Component Value Date/Time   CHOL 152 03/07/2019 10:31 AM   CHOL 233 (H) 03/20/2013 10:19 AM   TRIG 78 03/07/2019 10:31 AM   TRIG 82 03/20/2013 10:19 AM   HDL 61 03/07/2019 10:31 AM   HDL 64 03/20/2013 10:19 AM   CHOLHDL 2.5 03/07/2019 10:31 AM   LDLCALC 75 03/07/2019 10:31 AM   LDLCALC 153 (H) 03/20/2013 10:19 AM    Wt Readings from Last 3 Encounters:  11/16/19 180 lb (81.6 kg)  11/03/19 179 lb (81.2 kg)  09/15/19 176 lb (79.8 kg)     Objective:    Vital Signs:  Ht 5\' 10"  (1.778 m)   Wt 180 lb (81.6 kg)   BMI 25.83 kg/m    VITAL SIGNS:  reviewed GEN:  no acute distress RESPIRATORY:  speaking in full sentences without SOB CARDIOVASCULAR:  no peripheral edema NEURO:  A&O x3 PSYCH:  normal affect  ASSESSMENT & PLAN:    1. LE edema: well controlled on HCTZ - Continue HCTZ - Continue low sodium diet  2. Permanent atrial fibrillation/flutter: no evidence of reoccurrence. No complaints of bleeding  - Continue eliquis for stroke ppx  3. SSS s/p PPM: device check 10/2019 with normal function; V Paced 99% of the time - Continue routine monitoring and follow-up per Dr. Rayann Heman - will coordinate a follow-up visit since she has not had an appointment since 06/2018  4. HLD: followed by PCP; LDL 75 02/2019; at goal of <100 for patients without CAD - Continue atrovastatin  5. Mitochondrial myopathy: followed by Neurology at [facility] routine follow-up.    COVID-19 Education: The signs and symptoms of COVID-19 were discussed with the patient and how to seek care for testing (follow up with PCP or arrange E-visit).  The importance of social distancing was discussed today.  Time:   Today, I have spent 15 minutes with the patient with telehealth technology discussing the above problems.     Medication Adjustments/Labs and Tests Ordered: Current medicines are reviewed at length with the patient today.  Concerns regarding medicines are outlined above.   Tests Ordered: No orders of  the defined types were placed in this encounter.   Medication Changes: No orders of the defined types were placed in this encounter.   Follow Up: Recommend follow-up with EP, then she can establish care with Cardiology in [place], [place] in 1 year.   Signed, Abigail Butts, PA-C  11/16/2019 10:05 AM    [place]

## 2019-11-16 ENCOUNTER — Telehealth (INDEPENDENT_AMBULATORY_CARE_PROVIDER_SITE_OTHER): Payer: PPO | Admitting: Medical

## 2019-11-16 ENCOUNTER — Telehealth: Payer: Self-pay

## 2019-11-16 ENCOUNTER — Encounter: Payer: Self-pay | Admitting: Medical

## 2019-11-16 VITALS — Ht 70.0 in | Wt 180.0 lb

## 2019-11-16 DIAGNOSIS — I495 Sick sinus syndrome: Secondary | ICD-10-CM

## 2019-11-16 DIAGNOSIS — I484 Atypical atrial flutter: Secondary | ICD-10-CM | POA: Diagnosis not present

## 2019-11-16 DIAGNOSIS — Z950 Presence of cardiac pacemaker: Secondary | ICD-10-CM | POA: Diagnosis not present

## 2019-11-16 DIAGNOSIS — G713 Mitochondrial myopathy, not elsewhere classified: Secondary | ICD-10-CM

## 2019-11-16 DIAGNOSIS — E785 Hyperlipidemia, unspecified: Secondary | ICD-10-CM

## 2019-11-16 DIAGNOSIS — Z7901 Long term (current) use of anticoagulants: Secondary | ICD-10-CM

## 2019-11-16 DIAGNOSIS — R600 Localized edema: Secondary | ICD-10-CM | POA: Diagnosis not present

## 2019-11-16 NOTE — Patient Instructions (Addendum)
Medication Instructions:  Your physician recommends that you continue on your current medications as directed. Please refer to the Current Medication list given to you today.  *If you need a refill on your cardiac medications before your next appointment, please call your pharmacy*  Lab Work: NONE ordered at this time of appointment   If you have labs (blood work) drawn today and your tests are completely normal, you will receive your results only by: Marland Kitchen MyChart Message (if you have MyChart) OR . A paper copy in the mail If you have any lab test that is abnormal or we need to change your treatment, we will call you to review the results.  Testing/Procedures: NONE ordered at this time of appointment   Follow-Up: At [facility], you and your health needs are our priority.  As part of our continuing mission to provide you with exceptional heart care, we have created designated Provider Care Teams.  These Care Teams include your primary Cardiologist (physician) and Advanced Practice Providers (APPs -  Physician Assistants and Nurse Practitioners) who all work together to provide you with the care you need, when you need it.  Your next appointment:   1 month(s) with Dr. Rayann Heman 1 year(s) with Dr. Claiborne Billings  The format for your next appointment:   In Person In Person   Provider:   Thompson Grayer, MD Troy Sine, MD  Other Instructions

## 2019-11-16 NOTE — Telephone Encounter (Signed)
  Patient Consent for Virtual Visit         Wendy Rose has provided verbal consent on 11/16/2019 for a virtual visit (video or telephone).   CONSENT FOR VIRTUAL VISIT FOR:  Wendy Rose  By participating in this virtual visit I agree to the following:  I hereby voluntarily request, consent and authorize [facility] and its employed or contracted physicians, physician assistants, nurse practitioners or other licensed health care professionals (the Practitioner), to provide me with telemedicine health care services (the "Services") as deemed necessary by the treating Practitioner. I acknowledge and consent to receive the Services by the Practitioner via telemedicine. I understand that the telemedicine visit will involve communicating with the Practitioner through live audiovisual communication technology and the disclosure of certain medical information by electronic transmission. I acknowledge that I have been given the opportunity to request an in-person assessment or other available alternative prior to the telemedicine visit and am voluntarily participating in the telemedicine visit.  I understand that I have the right to withhold or withdraw my consent to the use of telemedicine in the course of my care at any time, without affecting my right to future care or treatment, and that the Practitioner or I may terminate the telemedicine visit at any time. I understand that I have the right to inspect all information obtained and/or recorded in the course of the telemedicine visit and may receive copies of available information for a reasonable fee.  I understand that some of the potential risks of receiving the Services via telemedicine include:  Marland Kitchen Delay or interruption in medical evaluation due to technological equipment failure or disruption; . Information transmitted may not be sufficient (e.g. poor resolution of images) to allow for appropriate medical decision making by the Practitioner; and/or    . In rare instances, security protocols could fail, causing a breach of personal health information.  Furthermore, I acknowledge that it is my responsibility to provide information about my medical history, conditions and care that is complete and accurate to the best of my ability. I acknowledge that Practitioner's advice, recommendations, and/or decision may be based on factors not within their control, such as incomplete or inaccurate data provided by me or distortions of diagnostic images or specimens that may result from electronic transmissions. I understand that the practice of medicine is not an exact science and that Practitioner makes no warranties or guarantees regarding treatment outcomes. I acknowledge that a copy of this consent can be made available to me via my patient portal ([facility]), or I can request a printed copy by calling the office of [facility].    I understand that my insurance will be billed for this visit.   I have read or had this consent read to me. . I understand the contents of this consent, which adequately explains the benefits and risks of the Services being provided via telemedicine.  . I have been provided ample opportunity to ask questions regarding this consent and the Services and have had my questions answered to my satisfaction. . I give my informed consent for the services to be provided through the use of telemedicine in my medical care

## 2019-11-16 NOTE — Telephone Encounter (Signed)
Called patient to discuss AVS instructions gave Krista Kroeger's recommendations and patient voiced understanding. AVS summary mailed to patient.    

## 2019-11-23 DIAGNOSIS — R0902 Hypoxemia: Secondary | ICD-10-CM | POA: Diagnosis not present

## 2019-11-28 ENCOUNTER — Other Ambulatory Visit: Payer: Self-pay | Admitting: Cardiovascular Disease

## 2019-11-28 MED ORDER — HYDROCHLOROTHIAZIDE 12.5 MG PO TABS: 12.5000 mg | ORAL_TABLET | Freq: Every day | ORAL | 3 refills | Status: AC

## 2019-11-28 NOTE — Telephone Encounter (Signed)
*  STAT* If patient is at the pharmacy, call can be transferred to refill team.   1. Which medications need to be refilled? (please list name of each medication and dose if known)  Need a new prescription for Hydrichlorathiazide  2. Which pharmacy/location (including street and city if local pharmacy) is medication to be sent t [facility] [phone]  3. Do they need a 30 day or 90 day supply? 90 days and refillls

## 2019-11-28 NOTE — Telephone Encounter (Signed)
Pt is out of medication. Please fill

## 2019-11-30 ENCOUNTER — Telehealth: Payer: Self-pay | Admitting: Internal Medicine

## 2019-11-30 NOTE — Telephone Encounter (Signed)
Dr. Annamaria Boots. Please see message from [facility] about patient's order being canceled because they could not get in contact with her.

## 2019-12-07 ENCOUNTER — Telehealth: Payer: Self-pay | Admitting: Cardiovascular Disease

## 2019-12-07 ENCOUNTER — Other Ambulatory Visit: Payer: Self-pay

## 2019-12-07 MED ORDER — ATORVASTATIN CALCIUM 20 MG PO TABS: ORAL_TABLET | ORAL | 1 refills | Status: DC

## 2019-12-07 NOTE — Telephone Encounter (Signed)
Follow up:    The patient calling concering some medications that she has not received and would like to know what is going on. Patient has called twice before. Please call patient.

## 2019-12-07 NOTE — Telephone Encounter (Signed)
Called patient- LVM to call back to discuss which medications were needed so I could assist.

## 2019-12-07 NOTE — Telephone Encounter (Signed)
Patient called back- discussed the medication she was needing, sent to pharmacy.  Patient thankful for call back.

## 2019-12-11 DIAGNOSIS — Z6825 Body mass index (BMI) 25.0-25.9, adult: Secondary | ICD-10-CM | POA: Diagnosis not present

## 2019-12-11 DIAGNOSIS — G713 Mitochondrial myopathy, not elsewhere classified: Secondary | ICD-10-CM | POA: Diagnosis not present

## 2019-12-11 DIAGNOSIS — G8929 Other chronic pain: Secondary | ICD-10-CM | POA: Diagnosis not present

## 2019-12-11 DIAGNOSIS — G4700 Insomnia, unspecified: Secondary | ICD-10-CM | POA: Diagnosis not present

## 2019-12-11 DIAGNOSIS — Z1231 Encounter for screening mammogram for malignant neoplasm of breast: Secondary | ICD-10-CM | POA: Diagnosis not present

## 2019-12-11 DIAGNOSIS — J9611 Chronic respiratory failure with hypoxia: Secondary | ICD-10-CM | POA: Diagnosis not present

## 2019-12-11 DIAGNOSIS — F332 Major depressive disorder, recurrent severe without psychotic features: Secondary | ICD-10-CM | POA: Diagnosis not present

## 2019-12-11 DIAGNOSIS — Z950 Presence of cardiac pacemaker: Secondary | ICD-10-CM | POA: Diagnosis not present

## 2019-12-11 DIAGNOSIS — K219 Gastro-esophageal reflux disease without esophagitis: Secondary | ICD-10-CM | POA: Diagnosis not present

## 2019-12-15 ENCOUNTER — Encounter: Payer: Self-pay | Admitting: Internal Medicine

## 2019-12-15 ENCOUNTER — Ambulatory Visit (INDEPENDENT_AMBULATORY_CARE_PROVIDER_SITE_OTHER): Payer: PPO | Admitting: Internal Medicine

## 2019-12-15 VITALS — Ht 68.0 in

## 2019-12-15 DIAGNOSIS — M797 Fibromyalgia: Secondary | ICD-10-CM

## 2019-12-15 DIAGNOSIS — G894 Chronic pain syndrome: Secondary | ICD-10-CM

## 2019-12-23 DIAGNOSIS — R0902 Hypoxemia: Secondary | ICD-10-CM | POA: Diagnosis not present

## 2020-01-03 ENCOUNTER — Encounter: Payer: PPO | Admitting: Internal Medicine

## 2020-01-07 NOTE — Progress Notes (Signed)
[facility] for pain management today

## 2020-01-10 ENCOUNTER — Other Ambulatory Visit: Payer: Self-pay

## 2020-01-10 DIAGNOSIS — F419 Anxiety disorder, unspecified: Secondary | ICD-10-CM

## 2020-01-10 DIAGNOSIS — F5101 Primary insomnia: Secondary | ICD-10-CM

## 2020-01-10 MED ORDER — ALPRAZOLAM 1 MG PO TABS: ORAL_TABLET | ORAL | 0 refills | Status: AC

## 2020-01-16 ENCOUNTER — Ambulatory Visit (INDEPENDENT_AMBULATORY_CARE_PROVIDER_SITE_OTHER): Payer: Medicare HMO | Admitting: *Deleted

## 2020-01-16 DIAGNOSIS — I495 Sick sinus syndrome: Secondary | ICD-10-CM

## 2020-01-16 LAB — CUP PACEART REMOTE DEVICE CHECK
Date Time Interrogation Session: 20210608125259
Implantable Lead Implant Date: 20140710
Implantable Lead Implant Date: 20140710
Implantable Lead Location: 753859
Implantable Lead Location: 753860
Implantable Lead Model: 346
Implantable Lead Model: 346
Implantable Lead Serial Number: 29334372
Implantable Lead Serial Number: 29426855
Implantable Pulse Generator Implant Date: 20140710
Pulse Gen Serial Number: 66385898

## 2020-01-17 NOTE — Progress Notes (Signed)
Remote pacemaker transmission.   

## 2020-01-25 ENCOUNTER — Other Ambulatory Visit: Payer: Self-pay | Admitting: Cardiovascular Disease

## 2020-01-26 ENCOUNTER — Telehealth: Payer: Self-pay

## 2020-01-26 NOTE — Telephone Encounter (Signed)
[facility] states the pt will be establishing care with them. I have released the pt in [facility], cancelled her upcoming appointments and marked her inactive in [facility].

## 2020-01-26 NOTE — Telephone Encounter (Signed)
Afib Female 74yo 81.6kg Scr 0.71 on 03-07-2019

## 2020-02-15 ENCOUNTER — Other Ambulatory Visit: Payer: Self-pay

## 2020-02-15 ENCOUNTER — Encounter (INDEPENDENT_AMBULATORY_CARE_PROVIDER_SITE_OTHER): Payer: Medicare HMO | Admitting: Ophthalmology

## 2020-02-15 DIAGNOSIS — H43813 Vitreous degeneration, bilateral: Secondary | ICD-10-CM | POA: Diagnosis not present

## 2020-02-15 DIAGNOSIS — H35373 Puckering of macula, bilateral: Secondary | ICD-10-CM | POA: Diagnosis not present

## 2020-05-06 ENCOUNTER — Ambulatory Visit: Payer: PPO | Admitting: Internal Medicine

## 2020-06-05 ENCOUNTER — Other Ambulatory Visit: Payer: Self-pay | Admitting: Cardiovascular Disease

## 2020-09-26 ENCOUNTER — Telehealth: Payer: Self-pay | Admitting: Internal Medicine

## 2020-09-26 NOTE — Telephone Encounter (Signed)
Patient states she has moved to [facility] and will not be able to see Dr. Rayann Heman anymore.

## 2020-11-30 ENCOUNTER — Other Ambulatory Visit: Payer: Self-pay | Admitting: Cardiovascular Disease

## 2020-12-02 ENCOUNTER — Other Ambulatory Visit: Payer: Self-pay

## 2023-06-23 ENCOUNTER — Encounter: Payer: Self-pay | Admitting: Psychiatry
